# Patient Record
Sex: Female | Born: 1965
Health system: Southern US, Community
[De-identification: ages and names within clinical notes are randomized; demographics above are authoritative.]

## PROBLEM LIST (undated history)

## (undated) DIAGNOSIS — D509 Iron deficiency anemia, unspecified: Secondary | ICD-10-CM

## (undated) DIAGNOSIS — G43909 Migraine, unspecified, not intractable, without status migrainosus: Secondary | ICD-10-CM

## (undated) DIAGNOSIS — I5032 Chronic diastolic (congestive) heart failure: Secondary | ICD-10-CM

## (undated) DIAGNOSIS — I10 Essential (primary) hypertension: Secondary | ICD-10-CM

## (undated) DIAGNOSIS — M545 Low back pain, unspecified: Secondary | ICD-10-CM

## (undated) DIAGNOSIS — R0789 Other chest pain: Secondary | ICD-10-CM

## (undated) DIAGNOSIS — R9389 Abnormal findings on diagnostic imaging of other specified body structures: Secondary | ICD-10-CM

## (undated) DIAGNOSIS — Q245 Malformation of coronary vessels: Secondary | ICD-10-CM

## (undated) HISTORY — PX: BREAST CYST ASPIRATION: SHX578

## (undated) HISTORY — DX: Malformation of coronary vessels: Q24.5

## (undated) HISTORY — PX: TONSILLECTOMY: SUR1361

## (undated) HISTORY — DX: Chronic diastolic (congestive) heart failure: I50.32

---

## 1993-08-04 HISTORY — PX: TUBAL LIGATION: SHX77

## 1993-08-04 HISTORY — PX: WISDOM TOOTH EXTRACTION: SHX21

## 1998-12-08 ENCOUNTER — Emergency Department (HOSPITAL_COMMUNITY): Admission: EM | Admit: 1998-12-08 | Discharge: 1998-12-08 | Payer: Self-pay | Admitting: Emergency Medicine

## 1998-12-11 ENCOUNTER — Encounter: Admission: RE | Admit: 1998-12-11 | Discharge: 1999-03-11 | Payer: Self-pay | Admitting: *Deleted

## 1999-07-13 ENCOUNTER — Emergency Department (HOSPITAL_COMMUNITY): Admission: EM | Admit: 1999-07-13 | Discharge: 1999-07-13 | Payer: Self-pay | Admitting: Emergency Medicine

## 1999-09-13 ENCOUNTER — Other Ambulatory Visit: Admission: RE | Admit: 1999-09-13 | Discharge: 1999-09-13 | Payer: Self-pay | Admitting: Obstetrics & Gynecology

## 2000-10-29 ENCOUNTER — Other Ambulatory Visit: Admission: RE | Admit: 2000-10-29 | Discharge: 2000-10-29 | Payer: Self-pay | Admitting: Obstetrics and Gynecology

## 2001-02-04 ENCOUNTER — Emergency Department (HOSPITAL_COMMUNITY): Admission: EM | Admit: 2001-02-04 | Discharge: 2001-02-04 | Payer: Self-pay | Admitting: Emergency Medicine

## 2001-07-02 ENCOUNTER — Emergency Department (HOSPITAL_COMMUNITY): Admission: EM | Admit: 2001-07-02 | Discharge: 2001-07-02 | Payer: Self-pay | Admitting: *Deleted

## 2001-07-03 ENCOUNTER — Encounter: Payer: Self-pay | Admitting: Emergency Medicine

## 2001-07-03 ENCOUNTER — Emergency Department (HOSPITAL_COMMUNITY): Admission: EM | Admit: 2001-07-03 | Discharge: 2001-07-03 | Payer: Self-pay | Admitting: Emergency Medicine

## 2001-09-09 ENCOUNTER — Encounter: Payer: Self-pay | Admitting: Occupational Medicine

## 2001-09-09 ENCOUNTER — Encounter: Admission: RE | Admit: 2001-09-09 | Discharge: 2001-09-09 | Payer: Self-pay | Admitting: Occupational Medicine

## 2001-09-24 ENCOUNTER — Encounter: Admission: RE | Admit: 2001-09-24 | Discharge: 2001-12-14 | Payer: Self-pay | Admitting: Occupational Medicine

## 2001-10-06 ENCOUNTER — Encounter: Payer: Self-pay | Admitting: Occupational Medicine

## 2001-10-06 ENCOUNTER — Encounter: Admission: RE | Admit: 2001-10-06 | Discharge: 2001-10-06 | Payer: Self-pay | Admitting: Occupational Medicine

## 2001-10-19 ENCOUNTER — Encounter: Admission: RE | Admit: 2001-10-19 | Discharge: 2002-01-17 | Payer: Self-pay

## 2002-01-03 ENCOUNTER — Other Ambulatory Visit: Admission: RE | Admit: 2002-01-03 | Discharge: 2002-01-03 | Payer: Self-pay | Admitting: Obstetrics and Gynecology

## 2002-01-15 ENCOUNTER — Emergency Department (HOSPITAL_COMMUNITY): Admission: EM | Admit: 2002-01-15 | Discharge: 2002-01-15 | Payer: Self-pay | Admitting: Emergency Medicine

## 2002-01-15 ENCOUNTER — Encounter: Payer: Self-pay | Admitting: Emergency Medicine

## 2002-01-18 ENCOUNTER — Encounter: Admission: RE | Admit: 2002-01-18 | Discharge: 2002-04-18 | Payer: Self-pay

## 2002-01-20 ENCOUNTER — Encounter: Admission: RE | Admit: 2002-01-20 | Discharge: 2002-02-02 | Payer: Self-pay

## 2002-06-17 ENCOUNTER — Encounter: Admission: RE | Admit: 2002-06-17 | Discharge: 2002-09-15 | Payer: Self-pay

## 2002-11-03 ENCOUNTER — Encounter: Admission: RE | Admit: 2002-11-03 | Discharge: 2003-02-01 | Payer: Self-pay

## 2003-01-09 ENCOUNTER — Other Ambulatory Visit: Admission: RE | Admit: 2003-01-09 | Discharge: 2003-01-09 | Payer: Self-pay | Admitting: Obstetrics and Gynecology

## 2003-02-23 ENCOUNTER — Ambulatory Visit (HOSPITAL_COMMUNITY): Admission: RE | Admit: 2003-02-23 | Discharge: 2003-02-23 | Payer: Self-pay | Admitting: Obstetrics and Gynecology

## 2003-04-13 ENCOUNTER — Encounter
Admission: RE | Admit: 2003-04-13 | Discharge: 2003-07-12 | Payer: Self-pay | Admitting: Physical Medicine & Rehabilitation

## 2003-07-02 ENCOUNTER — Ambulatory Visit (HOSPITAL_COMMUNITY)
Admission: RE | Admit: 2003-07-02 | Discharge: 2003-07-02 | Payer: Self-pay | Admitting: Physical Medicine & Rehabilitation

## 2003-07-18 ENCOUNTER — Encounter
Admission: RE | Admit: 2003-07-18 | Discharge: 2003-10-16 | Payer: Self-pay | Admitting: Physical Medicine & Rehabilitation

## 2003-10-23 ENCOUNTER — Encounter
Admission: RE | Admit: 2003-10-23 | Discharge: 2004-01-21 | Payer: Self-pay | Admitting: Physical Medicine & Rehabilitation

## 2003-11-28 ENCOUNTER — Encounter
Admission: RE | Admit: 2003-11-28 | Discharge: 2003-12-28 | Payer: Self-pay | Admitting: Physical Medicine & Rehabilitation

## 2004-02-23 ENCOUNTER — Encounter
Admission: RE | Admit: 2004-02-23 | Discharge: 2004-05-23 | Payer: Self-pay | Admitting: Physical Medicine & Rehabilitation

## 2004-05-23 ENCOUNTER — Encounter
Admission: RE | Admit: 2004-05-23 | Discharge: 2004-08-21 | Payer: Self-pay | Admitting: Physical Medicine & Rehabilitation

## 2004-05-27 ENCOUNTER — Ambulatory Visit: Payer: Self-pay | Admitting: Physical Medicine & Rehabilitation

## 2004-06-24 ENCOUNTER — Emergency Department (HOSPITAL_COMMUNITY): Admission: EM | Admit: 2004-06-24 | Discharge: 2004-06-24 | Payer: Self-pay | Admitting: Emergency Medicine

## 2004-08-23 ENCOUNTER — Encounter
Admission: RE | Admit: 2004-08-23 | Discharge: 2004-11-21 | Payer: Self-pay | Admitting: Physical Medicine & Rehabilitation

## 2004-08-27 ENCOUNTER — Ambulatory Visit: Payer: Self-pay | Admitting: Physical Medicine & Rehabilitation

## 2004-11-12 ENCOUNTER — Ambulatory Visit: Payer: Self-pay | Admitting: Physical Medicine & Rehabilitation

## 2005-02-14 ENCOUNTER — Encounter
Admission: RE | Admit: 2005-02-14 | Discharge: 2005-05-15 | Payer: Self-pay | Admitting: Physical Medicine & Rehabilitation

## 2005-02-14 ENCOUNTER — Ambulatory Visit: Payer: Self-pay | Admitting: Physical Medicine & Rehabilitation

## 2005-02-21 ENCOUNTER — Ambulatory Visit (HOSPITAL_COMMUNITY)
Admission: RE | Admit: 2005-02-21 | Discharge: 2005-02-21 | Payer: Self-pay | Admitting: Physical Medicine & Rehabilitation

## 2005-03-24 ENCOUNTER — Ambulatory Visit: Payer: Self-pay | Admitting: Physical Medicine & Rehabilitation

## 2005-05-05 ENCOUNTER — Ambulatory Visit: Payer: Self-pay | Admitting: Physical Medicine & Rehabilitation

## 2005-06-03 ENCOUNTER — Encounter
Admission: RE | Admit: 2005-06-03 | Discharge: 2005-09-01 | Payer: Self-pay | Admitting: Physical Medicine & Rehabilitation

## 2005-08-01 ENCOUNTER — Ambulatory Visit: Payer: Self-pay | Admitting: Physical Medicine & Rehabilitation

## 2005-08-29 ENCOUNTER — Encounter
Admission: RE | Admit: 2005-08-29 | Discharge: 2005-11-27 | Payer: Self-pay | Admitting: Physical Medicine & Rehabilitation

## 2005-09-02 ENCOUNTER — Ambulatory Visit: Payer: Self-pay | Admitting: Physical Medicine & Rehabilitation

## 2005-09-06 ENCOUNTER — Emergency Department (HOSPITAL_COMMUNITY): Admission: EM | Admit: 2005-09-06 | Discharge: 2005-09-06 | Payer: Self-pay | Admitting: Emergency Medicine

## 2005-10-10 ENCOUNTER — Ambulatory Visit: Payer: Self-pay | Admitting: Physical Medicine & Rehabilitation

## 2005-11-08 ENCOUNTER — Emergency Department (HOSPITAL_COMMUNITY): Admission: EM | Admit: 2005-11-08 | Discharge: 2005-11-08 | Payer: Self-pay | Admitting: Family Medicine

## 2005-11-11 ENCOUNTER — Ambulatory Visit: Payer: Self-pay | Admitting: Physical Medicine & Rehabilitation

## 2005-12-04 ENCOUNTER — Encounter
Admission: RE | Admit: 2005-12-04 | Discharge: 2006-03-04 | Payer: Self-pay | Admitting: Physical Medicine & Rehabilitation

## 2006-01-12 ENCOUNTER — Ambulatory Visit: Payer: Self-pay | Admitting: Physical Medicine & Rehabilitation

## 2006-03-10 ENCOUNTER — Ambulatory Visit: Payer: Self-pay | Admitting: Physical Medicine & Rehabilitation

## 2006-03-11 ENCOUNTER — Encounter
Admission: RE | Admit: 2006-03-11 | Discharge: 2006-06-09 | Payer: Self-pay | Admitting: Physical Medicine & Rehabilitation

## 2007-09-14 ENCOUNTER — Inpatient Hospital Stay (HOSPITAL_COMMUNITY): Admission: EM | Admit: 2007-09-14 | Discharge: 2007-09-16 | Payer: Self-pay | Admitting: Emergency Medicine

## 2008-10-30 ENCOUNTER — Emergency Department (HOSPITAL_COMMUNITY): Admission: EM | Admit: 2008-10-30 | Discharge: 2008-10-30 | Payer: Self-pay | Admitting: Emergency Medicine

## 2008-12-30 ENCOUNTER — Emergency Department (HOSPITAL_COMMUNITY): Admission: EM | Admit: 2008-12-30 | Discharge: 2008-12-31 | Payer: Self-pay | Admitting: Emergency Medicine

## 2009-03-07 ENCOUNTER — Emergency Department (HOSPITAL_COMMUNITY): Admission: EM | Admit: 2009-03-07 | Discharge: 2009-03-07 | Payer: Self-pay | Admitting: Emergency Medicine

## 2009-11-24 ENCOUNTER — Emergency Department (HOSPITAL_COMMUNITY): Admission: EM | Admit: 2009-11-24 | Discharge: 2009-11-24 | Payer: Self-pay | Admitting: Emergency Medicine

## 2010-03-08 ENCOUNTER — Emergency Department (HOSPITAL_COMMUNITY): Admission: EM | Admit: 2010-03-08 | Discharge: 2010-03-08 | Payer: Self-pay | Admitting: Emergency Medicine

## 2010-08-05 ENCOUNTER — Emergency Department (HOSPITAL_COMMUNITY)
Admission: EM | Admit: 2010-08-05 | Discharge: 2010-08-05 | Payer: Self-pay | Source: Home / Self Care | Admitting: Emergency Medicine

## 2010-10-14 LAB — DIFFERENTIAL
Basophils Absolute: 0 10*3/uL (ref 0.0–0.1)
Basophils Relative: 1 % (ref 0–1)
Lymphocytes Relative: 46 % (ref 12–46)
Monocytes Absolute: 0.4 10*3/uL (ref 0.1–1.0)
Neutro Abs: 1.7 10*3/uL (ref 1.7–7.7)
Neutrophils Relative %: 40 % — ABNORMAL LOW (ref 43–77)

## 2010-10-14 LAB — COMPREHENSIVE METABOLIC PANEL
Albumin: 3.5 g/dL (ref 3.5–5.2)
BUN: 15 mg/dL (ref 6–23)
Chloride: 107 mEq/L (ref 96–112)
Creatinine, Ser: 1.08 mg/dL (ref 0.4–1.2)
Glucose, Bld: 86 mg/dL (ref 70–99)
Total Bilirubin: 0.4 mg/dL (ref 0.3–1.2)
Total Protein: 7.4 g/dL (ref 6.0–8.3)

## 2010-10-14 LAB — RAPID URINE DRUG SCREEN, HOSP PERFORMED
Amphetamines: NOT DETECTED
Barbiturates: NOT DETECTED
Benzodiazepines: NOT DETECTED
Opiates: NOT DETECTED

## 2010-10-14 LAB — CBC
HCT: 35.4 % — ABNORMAL LOW (ref 36.0–46.0)
MCH: 26.9 pg (ref 26.0–34.0)
MCV: 82.1 fL (ref 78.0–100.0)
Platelets: 278 10*3/uL (ref 150–400)
RDW: 13.4 % (ref 11.5–15.5)

## 2010-10-14 LAB — URINALYSIS, ROUTINE W REFLEX MICROSCOPIC
Bilirubin Urine: NEGATIVE
Ketones, ur: NEGATIVE mg/dL
Protein, ur: NEGATIVE mg/dL
Urobilinogen, UA: 0.2 mg/dL (ref 0.0–1.0)

## 2010-11-09 LAB — URINALYSIS, ROUTINE W REFLEX MICROSCOPIC
Bilirubin Urine: NEGATIVE
Nitrite: NEGATIVE
Specific Gravity, Urine: 1.023 (ref 1.005–1.030)
Urobilinogen, UA: 1 mg/dL (ref 0.0–1.0)
pH: 7.5 (ref 5.0–8.0)

## 2010-11-09 LAB — D-DIMER, QUANTITATIVE: D-Dimer, Quant: 0.34 ug/mL-FEU (ref 0.00–0.48)

## 2010-11-09 LAB — URINE MICROSCOPIC-ADD ON

## 2010-11-09 LAB — POCT CARDIAC MARKERS
CKMB, poc: <1 ng/mL — ABNORMAL LOW (ref 1.0–8.0)
Myoglobin, poc: 55.1 ng/mL (ref 12–200)
Troponin i, poc: <0.05 ng/mL (ref 0.00–0.09)

## 2010-11-09 LAB — POCT I-STAT, CHEM 8
BUN: 12 mg/dL (ref 6–23)
Calcium, Ion: 1.16 mmol/L (ref 1.12–1.32)
Creatinine, Ser: 1.2 mg/dL (ref 0.4–1.2)
Glucose, Bld: 80 mg/dL (ref 70–99)
Hemoglobin: 12.6 g/dL (ref 12.0–15.0)
Sodium: 139 mEq/L (ref 135–145)
TCO2: 20 mmol/L (ref 0–100)

## 2010-11-09 LAB — POCT PREGNANCY, URINE: Preg Test, Ur: NEGATIVE

## 2010-11-14 LAB — CBC
Hemoglobin: 10.9 g/dL — ABNORMAL LOW (ref 12.0–15.0)
MCV: 83.9 fL (ref 78.0–100.0)
RBC: 3.87 MIL/uL (ref 3.87–5.11)
WBC: 8.1 10*3/uL (ref 4.0–10.5)

## 2010-11-14 LAB — BASIC METABOLIC PANEL
CO2: 25 mEq/L (ref 19–32)
Calcium: 9.1 mg/dL (ref 8.4–10.5)
Chloride: 105 mEq/L (ref 96–112)
Creatinine, Ser: 1.01 mg/dL (ref 0.4–1.2)
GFR calc Af Amer: >60 mL/min (ref >60)
Sodium: 137 mEq/L (ref 135–145)

## 2010-11-14 LAB — WOUND CULTURE

## 2010-11-14 LAB — DIFFERENTIAL
Basophils Absolute: 0 10*3/uL (ref 0.0–0.1)
Eosinophils Absolute: 0.2 10*3/uL (ref 0.0–0.7)
Eosinophils Relative: 2 % (ref 0–5)
Lymphs Abs: 2.1 10*3/uL (ref 0.7–4.0)

## 2010-11-29 ENCOUNTER — Inpatient Hospital Stay (INDEPENDENT_AMBULATORY_CARE_PROVIDER_SITE_OTHER)
Admission: RE | Admit: 2010-11-29 | Discharge: 2010-11-29 | Disposition: A | Discharge status: Home / Self Care | Payer: Self-pay | Source: Ambulatory Visit | Attending: Family Medicine | Admitting: Family Medicine

## 2010-11-29 DIAGNOSIS — B9789 Other viral agents as the cause of diseases classified elsewhere: Secondary | ICD-10-CM

## 2010-11-29 LAB — POCT URINALYSIS DIP (DEVICE)
Glucose, UA: NEGATIVE mg/dL
Nitrite: NEGATIVE
Urobilinogen, UA: 0.2 mg/dL (ref 0.0–1.0)

## 2010-12-17 NOTE — Consult Note (Signed)
Monique, Aguilar NO.:  [id number]   MEDICAL RECORD NO.:  [id number]          PATIENT TYPE:  INP   LOCATION:  1442                         FACILITY:  [facility]   PHYSICIAN:  Antonietta Breach, M.D.  DATE OF BIRTH:  07/18/1968   DATE OF CONSULTATION:  09/16/2007  DATE OF DISCHARGE:  09/16/2007                                 CONSULTATION   REASON FOR CONSULTATION:  Overdose.   REQUESTING PHYSICIAN:  [facility].   HISTORY OF PRESENT ILLNESS:  Monique Aguilar is a 45 year old female  admitted to the [facility] on February 9 for complications of  a drug overdose.  The patient denies that she ever had any intentions of  harming herself.  She denies any thoughts of harming herself.  She  denies thoughts of harming others.  She does not have hallucinations or  delusions.  Her orientation function and memory function are intact.  She is cooperative with bedside care.  She states that the overdose was  an accident.  She thinks that it was misconstrued as a suicide attempt.  She has constructive future goals and interests.   PAST PSYCHIATRIC HISTORY:  The patient does have a history of treatment  with Effexor and Wellbutrin.  However, she denies any problems with  mood.  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   FAMILY PSYCHIATRIC HISTORY:  None known.   SOCIAL HISTORY:  CNA, married.  There have been some arguments at home.  The patient denies any alcohol or drug problems.   PAST MEDICAL HISTORY:  Drug overdose.   MEDICATIONS:  The patient is not on any psychotropic medications.   REVIEW OF SYSTEMS:  Unremarkable.   MENTAL STATUS EXAM:  [name] is alert.  She is oriented to all  spheres.  Her memory function is intact.  Her speech is normal.  Thought  process logical, coherent, goal-directed.  No looseness of associations.  Thought content no thoughts of harming herself, no thoughts of harming  others.  No delusions, no  hallucinations.  Insight is intact.  Affect is  broad and appropriate.  Judgment is intact.   ASSESSMENT:  AXIS I:  Adjustment disorder with mixed disturbance of  emotions and conduct, now resolved, 293.00, delirium not otherwise  specified.  AXIS II:  Deferred.  AXIS III:  See general medical section.  AXIS IV:  Marital.  AXIS V:  55.   [name] is not at risk to harm herself or others.  She agrees to call  emergency services immediately for any thoughts of harming herself,  thoughts of harming others or distress.   The patient declines any psychiatric care and she is no longer  committable after recovering from her acute mental status changes.  However, given the inconsistency in her history versus reports, further  evaluation on an inpatient psychiatric unit is recommended but not  mandatory.  As mentioned above, the patient does agree to call emergency  services immediately if any emergency psychiatric symptoms present.  Therefore the patient is psychiatrically cleared for  discharge since she  does not present any behavioral, emotional or thought problems that  mandate treatment.  If there are any other concerns, please call [facility] at [phone].      Antonietta Breach, M.D.  Electronically Signed     JW/MEDQ  D:  09/16/2007  T:  09/18/2007  Job:  [id number]

## 2010-12-17 NOTE — Discharge Summary (Signed)
Monique Aguilar, RIKARD NO.:  [id number]   MEDICAL RECORD NO.:  [id number]          PATIENT TYPE:  INP   LOCATION:  1442                         FACILITY:  [facility]   PHYSICIAN:  Marcellus Scott, MD     DATE OF BIRTH:  07/18/1968   DATE OF ADMISSION:  09/13/2007  DATE OF DISCHARGE:  09/16/2007                               DISCHARGE SUMMARY   PRIMARY CARE PHYSICIAN:  Unassigned.   PAIN MANAGEMENT PHYSICIAN:  Dr. Ranelle Oyster.   DISCHARGE DIAGNOSES:  1. Change in mental status, resolved.  2. Drug overdose - poly-pharmacy.  3. Depression.  4. Anemia.  5. Renal insufficiency.  6. Chronic back pain.   DISCHARGE MEDICATIONS:  All of her medications have to be very closely  reviewed by her pain management team and her psychiatrist, and to be  adjusted.  1. Neurontin 600 mg p.o. three times daily.  2. Ultracet 325/37.5 mg, one to two tab p.o. four times daily p.r.n.  3. Wellbutrin XL 300 mg p.o. daily.  4. Lyrica 150 mg p.o. twice daily.  5. Topamax 100 mg p.o. at bedtime.  6. Baclofen 5 mg p.o. p.r.n.  7. Flexeril 10 mg p.o. p.r.n.  8. Prednisone 10 mg p.o. daily.   PROCEDURE:  Portable chest x-ray on September 13, 2007:  Impression is  poor inspiratory portable exam with mild pulmonary vascular congestion.   LABORATORY DATA:  Basic metabolic panel remarkable for a BUN of 12,  creatinine 1.29.  Hepatic panel was unremarkable except for an albumin  of 3.  CBC:  Hemoglobin 10.8, hematocrit 31.6, white blood cells 4.7,  platelets 277.  INR 1.1.  Serum salicylate less than 4.  Blood alcohol  level negative.  Serum acetaminophen less than 10.  Urine pregnancy test  negative.  Urine drug screen also negative.  Urinalysis negative for  features suggestive of urinary tract infection.   CONSULTATIONS:  Psychiatry, Dr. Antonietta Breach.   HISTORY OF PRESENT ILLNESS:  Please refer to the history and physical  note for initial admission details.  Ms. Monique Aguilar is a  pleasant 45-  year-old African/American female patient with a history of chronic back  pain and depression, who after an argument with her spouse, consumed a  lot of medications.  Not sure of exactly what and how many medications  she consumed.  The patient indicated that she put a lot of different  medications in a Percocet bottle and she is unable to say which ones  they are and how many she took on the day of admission.  Many of these  medications are even expired.  In any event, her 46 year old daughter  called 911, and EMS brought her to the emergency room.  The patient en  route here was combative and had to be restrained.   HOSPITAL COURSE:  In the emergency department the patient was  significantly drowsy and not arousable.  She was assessed to have an  unknown drug overdose and was admitted to the intensive care unit for  close monitoring.  The patient was admitted to the intensive  care unit.  She was placed on a one on one sitter.  She was kept n.p.o. and hydrated  with IV fluids.  Her neurological status was closely monitored.  By the  afternoon of September 14, 2007, the patient woke up and was quite alert  and awake, following which she transitioned to a medical floor.  She was  resumed on her diet, which she has tolerated.  Since then the patient  has been without any complaints and stable.  The patient has been  indicating that she would like to go home.  She vehemently denied having  attempted suicide.  She also was not keen on getting an inpatient  psychiatric visit.  She again was unable to clarify the exact  medications that she is on.  Psychiatry has evaluated her today and  indicated that she declined additional treatment and they have  psychiatrically cleared her for discharge, and also indicated that she  is not committable.   DISPOSITION:  The patient at this time has been advised to follow up  with her pain management physician and psychiatrist to correctly  advise  her regarding the exact medications that she is supposed to be on.  She  has been counseled against using poly-pharmacy and expired medications,  and the detrimental effects of the same, which she verbalized  understanding.  The patient at this time is stable to be discharged, and  followed up as an outpatient.      Marcellus Scott, MD  Electronically Signed     AH/MEDQ  D:  09/16/2007  T:  09/16/2007  Job:  [id number]   cc:   Antonietta Breach, M.D.   Ranelle Oyster, M.D.  Fax: [phone]

## 2010-12-17 NOTE — H&P (Signed)
NAMESHANAE, LUO                 ACCOUNT NO.:  [id number]   MEDICAL RECORD NO.:  [id number]          PATIENT TYPE:  INP   LOCATION:  0101                         FACILITY:  [facility]   PHYSICIAN:  Darryl D. Prime, MD    DATE OF BIRTH:  07/18/1968   DATE OF ADMISSION:  09/13/2007  DATE OF DISCHARGE:                              HISTORY & PHYSICAL   PRIMARY CARE PHYSICIAN:  The patient has no primary care physician.   TIME SEEN:  Total visit time was approximately 50 minutes.   CHIEF COMPLAINT:  The patient cannot give a chief complaint.  She is  Full Code.  Her sister is at the bedside and notes the patient took  medications.   HISTORY OF THE PRESENT ILLNESS:  The patient is a 45 year old female  with a history of chronic back pain and depression who had a possible  overdose.  The patient apparently got in a fight with her husband  earlier this evening and her daughter, a 29 year old, called 9-1-1.  The  patient's son, a 35 year old from another relationship, apparently  visited them tonight and apparently her current husband gets very angry  when this happens.  There was apparently an ensuing argument and  apparently this upset the patient to where she took many medications.  The patient has many expired medications with her that she takes and  that she keeps them nearby when she is depressed and occasionally takes  per the sister.  I am unsure of when she took the medications, what type  of medications she took and how many.  In the bag she has Neurontin 600-  mg tablets, Ultracet 325/37.5-mg tablets, cephalexin 500-mg tablets,  Percocet, amoxicillin 875-mg tablets, Vicodin 7.5/750, Anaprox 550-mg  tablets, Wellbutrin XL 300-mg tablets, Effexor XR 75-mg tablets, Relafen  500-mg tablets, Lyrica 150-mg tablets, Topamax 100-mg tablets, baclofen  5-mg tablets, cyclobenzaprine 10-mg tablets, and prednisone 10-mg  tablets and also Ambien.  En route the patient apparently became  combative with SWAT police officers and EMS personnel and had to be  restrained.  The patient since then has been significantly drowsy and  somnolent so much so that she is now unarousable.  She arouses some with  nauseous stimuli for a second or two; she is very difficult to arouse  otherwise.   PAST MEDICAL AND SURGICAL HISTORY:  1. History as above.  2. Possible bipolar disorder.   MEDICATIONS:  The medications are as above.  She does not take any of  these regularly, but only when depressed.   ALLERGIES:  No known drug allergies.   SOCIAL HISTORY:  The patient has never smoked or drank.  No illicit drug  use.  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  She lives at home with her husband and her  daughter.   FAMILY HISTORY:  The family history is positive for alcohol abuse in her  mother and the mother has a history of seizures.   REVIEW OF SYSTEMS:  The review of systems cannot be obtained secondary  to altered mental status.   PHYSICAL EXAMINATION:  VITAL SIGNS:  Temperature is 97.7 with a blood  pressure of 136/73, pulse is in the range of 85-127, respiratory rate  16, and sat 100%.  GENERAL APPEARANCE:  In general the patient looks her stated age.  She  is sleeping and is sedated.  HEENT:  Pupils are equal, round and react to light, 2 mm.  Conjunctivae  are not pale.  Anicteric sclerae.  The oropharynx is dry.  NECK:  The neck is supple with no lymphadenopathy or thyromegaly.  No  carotid bruits.  LUNGS:  The lungs are clear to auscultation bilaterally.  HEART:  Cardiovascular exam is regular rhythm and rate with no murmurs,  rubs or gallops.  Normal S1 and S2 with no S3 or S4.  ABDOMEN:  The abdomen is soft and she does not withdraw with palpation.  Positive bowel sounds.  EXTREMITIES:  The extremities show no clubbing, cyanosis or edema.  She  is in four-point restraints.  The patient moves all extremities at times  when  attempts are made to arouse her.   LABORATORY DATA:  The patient's laboratory data shows a white count of  5.3 with a hemoglobin of 11.4, hematocrit 33.7, platelets are 290,000  with an  MCV of 82.7, and segs are 66%.  Salicylate is less than 4.  Urine drug screen negative.  Urinalysis is negative.  Acetaminophen less  than 10.  Alcohol less than 5.  Sodium 137, potassium 4.0, chloride 107,  bicarb 21, BUN 10, creatinine 1.24, and glucose 96.  LFTs are otherwise  unremarkable.  Albumin is 3.6.  Chest x-ray is pending.   ASSESSMENT AND PLAN:  This is a patient with history of depression and  chronic back pain -- history of possible significant depression who took  an overdose, unsure of the drug, and we cannot rule out a suicide  attempt.  She will be admitted to the intensive care unit with frequent  neurological checks and vital signs will be followed closely.  When she  does arouse she will most likely need a psychiatric consult.  Deep  venous thrombosis prophylaxis will be with enoxaparin and  gastrointestinal prophylaxis with a proton pump inhibitor.  We will  check an INR, prothrombin time and partial thromboplastin time to assess  her liver function further; and, we will also hemoccult her stools due  to her decreased hemoglobin.      Darryl D. Prime, MD  Electronically Signed     DDP/MEDQ  D:  09/14/2007  T:  09/14/2007  Job:  [id number]

## 2010-12-25 ENCOUNTER — Inpatient Hospital Stay (INDEPENDENT_AMBULATORY_CARE_PROVIDER_SITE_OTHER)
Admission: RE | Admit: 2010-12-25 | Discharge: 2010-12-25 | Disposition: A | Discharge status: Home / Self Care | Payer: Self-pay | Source: Ambulatory Visit | Attending: Emergency Medicine | Admitting: Emergency Medicine

## 2010-12-25 ENCOUNTER — Other Ambulatory Visit: Payer: Self-pay | Admitting: Emergency Medicine

## 2010-12-25 DIAGNOSIS — Z1231 Encounter for screening mammogram for malignant neoplasm of breast: Secondary | ICD-10-CM

## 2010-12-25 DIAGNOSIS — M545 Low back pain: Secondary | ICD-10-CM

## 2010-12-25 DIAGNOSIS — S335XXA Sprain of ligaments of lumbar spine, initial encounter: Secondary | ICD-10-CM

## 2010-12-25 LAB — POCT URINALYSIS DIP (DEVICE)
Glucose, UA: NEGATIVE mg/dL
Nitrite: NEGATIVE
Protein, ur: NEGATIVE mg/dL
Urobilinogen, UA: 0.2 mg/dL (ref 0.0–1.0)
pH: 6.5 (ref 5.0–8.0)

## 2011-01-02 ENCOUNTER — Ambulatory Visit
Admission: RE | Admit: 2011-01-02 | Discharge: 2011-01-02 | Disposition: A | Discharge status: Home / Self Care | Payer: Self-pay | Source: Ambulatory Visit | Attending: Emergency Medicine | Admitting: Emergency Medicine

## 2011-01-02 DIAGNOSIS — Z1231 Encounter for screening mammogram for malignant neoplasm of breast: Secondary | ICD-10-CM

## 2011-01-07 ENCOUNTER — Other Ambulatory Visit: Payer: Self-pay | Admitting: Emergency Medicine

## 2011-01-07 DIAGNOSIS — R928 Other abnormal and inconclusive findings on diagnostic imaging of breast: Secondary | ICD-10-CM

## 2011-01-14 ENCOUNTER — Ambulatory Visit
Admission: RE | Admit: 2011-01-14 | Discharge: 2011-01-14 | Disposition: A | Discharge status: Home / Self Care | Payer: Self-pay | Source: Ambulatory Visit | Attending: Emergency Medicine | Admitting: Emergency Medicine

## 2011-01-14 ENCOUNTER — Other Ambulatory Visit: Payer: Self-pay | Admitting: Emergency Medicine

## 2011-01-14 DIAGNOSIS — R928 Other abnormal and inconclusive findings on diagnostic imaging of breast: Secondary | ICD-10-CM

## 2011-01-22 ENCOUNTER — Ambulatory Visit
Admission: RE | Admit: 2011-01-22 | Discharge: 2011-01-22 | Disposition: A | Discharge status: Home / Self Care | Payer: Self-pay | Source: Ambulatory Visit | Attending: Emergency Medicine | Admitting: Emergency Medicine

## 2011-01-22 ENCOUNTER — Other Ambulatory Visit: Payer: Self-pay

## 2011-01-22 ENCOUNTER — Other Ambulatory Visit: Payer: Self-pay | Admitting: Emergency Medicine

## 2011-01-22 ENCOUNTER — Other Ambulatory Visit: Payer: Self-pay | Admitting: Diagnostic Radiology

## 2011-01-22 DIAGNOSIS — R928 Other abnormal and inconclusive findings on diagnostic imaging of breast: Secondary | ICD-10-CM

## 2011-01-23 ENCOUNTER — Ambulatory Visit
Admission: RE | Admit: 2011-01-23 | Discharge: 2011-01-23 | Disposition: A | Discharge status: Home / Self Care | Payer: Self-pay | Source: Ambulatory Visit | Attending: Emergency Medicine | Admitting: Emergency Medicine

## 2011-01-23 DIAGNOSIS — R928 Other abnormal and inconclusive findings on diagnostic imaging of breast: Secondary | ICD-10-CM

## 2011-04-08 ENCOUNTER — Inpatient Hospital Stay (INDEPENDENT_AMBULATORY_CARE_PROVIDER_SITE_OTHER)
Admission: RE | Admit: 2011-04-08 | Discharge: 2011-04-08 | Disposition: A | Discharge status: Home / Self Care | Payer: Self-pay | Source: Ambulatory Visit | Attending: Family Medicine | Admitting: Family Medicine

## 2011-04-08 DIAGNOSIS — K5289 Other specified noninfective gastroenteritis and colitis: Secondary | ICD-10-CM

## 2011-04-25 LAB — CBC
HCT: 31.6 — ABNORMAL LOW
HCT: 33.7 — ABNORMAL LOW
Hemoglobin: 10.8 — ABNORMAL LOW
MCHC: 33.9
MCHC: 34.1
MCV: 82.7
Platelets: 290
RDW: 13.1
RDW: 13.6

## 2011-04-25 LAB — DIFFERENTIAL
Lymphocytes Relative: 26
Monocytes Absolute: 0.3
Monocytes Relative: 6
Neutro Abs: 3.5
Neutrophils Relative %: 66

## 2011-04-25 LAB — COMPREHENSIVE METABOLIC PANEL
Albumin: 3.6
Alkaline Phosphatase: 45
BUN: 10
BUN: 11
Calcium: 8.8
Calcium: 9.3
Creatinine, Ser: 1.24 — ABNORMAL HIGH
Creatinine, Ser: 1.47 — ABNORMAL HIGH
Glucose, Bld: 84
Total Protein: 6.2
Total Protein: 7.1

## 2011-04-25 LAB — BASIC METABOLIC PANEL
CO2: 20
Calcium: 8.6
Chloride: 111
GFR calc Af Amer: 55 — ABNORMAL LOW
Potassium: 4.1
Sodium: 135

## 2011-04-25 LAB — URINALYSIS, ROUTINE W REFLEX MICROSCOPIC
Glucose, UA: NEGATIVE
Nitrite: NEGATIVE
Protein, ur: NEGATIVE
pH: 8

## 2011-04-25 LAB — PROTIME-INR
INR: 1.1
Prothrombin Time: 14

## 2011-04-25 LAB — RAPID URINE DRUG SCREEN, HOSP PERFORMED
Barbiturates: NOT DETECTED
Benzodiazepines: NOT DETECTED
Cocaine: NOT DETECTED

## 2011-04-25 LAB — ETHANOL: Alcohol, Ethyl (B): <5

## 2011-04-25 LAB — PREGNANCY, URINE: Preg Test, Ur: NEGATIVE

## 2011-09-22 ENCOUNTER — Encounter (HOSPITAL_COMMUNITY): Payer: Self-pay | Admitting: Emergency Medicine

## 2011-09-22 ENCOUNTER — Emergency Department (HOSPITAL_COMMUNITY)
Admission: EM | Admit: 2011-09-22 | Discharge: 2011-09-22 | Disposition: A | Discharge status: Home / Self Care | Payer: Self-pay | Source: Home / Self Care | Attending: Emergency Medicine | Admitting: Emergency Medicine

## 2011-09-22 DIAGNOSIS — M545 Low back pain: Secondary | ICD-10-CM

## 2011-09-22 MED ORDER — CYCLOBENZAPRINE HCL 10 MG PO TABS: 10.0000 mg | ORAL_TABLET | Freq: Three times a day (TID) | ORAL | Status: AC | PRN

## 2011-09-22 MED ORDER — PREDNISONE 20 MG PO TABS: ORAL_TABLET | ORAL | Status: AC

## 2011-09-22 MED ORDER — IBUPROFEN 800 MG PO TABS: 800.0000 mg | ORAL_TABLET | Freq: Three times a day (TID) | ORAL | Status: AC | PRN

## 2011-09-22 NOTE — ED Provider Notes (Signed)
History     CSN: [id number]  Arrival date & time 09/22/11  7829   First MD Initiated Contact with Patient 09/22/11 [phone]      Chief Complaint  Patient presents with  . Back Pain  . Leg Pain    (Consider location/radiation/quality/duration/timing/severity/associated sxs/prior treatment) HPI Comments: Patient with achy bilateral lower back pain radiating to her hips starting 4 days ago. He patient states this is identical to previous back pain flares. Has been taking Goody powders and some leftover Lortab. Has been treated successfully in the past with ibuprofen, muscle relaxants, and steroids.  Patient is a 46 y.o. female presenting with back pain and leg pain. The history is provided by the patient. No language interpreter was used.  Back Pain  This is a recurrent problem. The current episode started more than 2 days ago. The problem occurs constantly. The problem has not changed since onset.The pain is associated with lifting heavy objects. The pain is present in the lumbar spine. The quality of the pain is described as aching. The symptoms are aggravated by bending, certain positions and twisting. Associated symptoms include leg pain. Pertinent negatives include no fever, no numbness, no abdominal pain, no abdominal swelling, no bowel incontinence, no perianal numbness, no bladder incontinence, no dysuria, no pelvic pain, no paresthesias, no paresis, no tingling and no weakness. She has tried heat and analgesics for the symptoms. The treatment provided mild relief.  Leg Pain  Pertinent negatives include no numbness and no tingling.    Past Medical History  Diagnosis Date  . Back pain     Past Surgical History  Procedure Date  . Tonsillectomy   . Cesarean section     History reviewed. No pertinent family history.  History  Substance Use Topics  . Smoking status: Never Smoker   . Smokeless tobacco: Not on file  . Alcohol Use: No    OB History    Grav Para Term Preterm  Abortions TAB SAB Ect Mult Living                  Review of Systems  Constitutional: Negative for fever.  Gastrointestinal: Negative for abdominal pain and bowel incontinence.  Genitourinary: Negative for bladder incontinence, dysuria and pelvic pain.  Musculoskeletal: Positive for back pain.  Neurological: Negative for tingling, weakness, numbness and paresthesias.    Allergies  Review of patient's allergies indicates no known allergies.  Home Medications   Current Outpatient Rx  Name Route Sig Dispense Refill  . HYDROCODONE-ACETAMINOPHEN 10-500 MG PO TABS Oral Take 1 tablet by mouth every 6 (six) hours as needed.    . CYCLOBENZAPRINE HCL 10 MG PO TABS Oral Take 1 tablet (10 mg total) by mouth 3 (three) times daily as needed for muscle spasms. 20 tablet 0  . IBUPROFEN 800 MG PO TABS Oral Take 1 tablet (800 mg total) by mouth every 8 (eight) hours as needed for pain. 30 tablet 0  . PREDNISONE 20 MG PO TABS  Take 3 tabs po on first day, 2 tabs second day, 2 tabs third day, 1 tab fourth day, 1 tab 5th day. Take with food. 9 tablet 0    BP 117/68  Pulse 70  Temp(Src) 98.2 F (36.8 C) (Oral)  Resp 16  SpO2 100%  LMP 09/10/2011  Physical Exam  Nursing note and vitals reviewed. Constitutional: She is oriented to person, place, and time. She appears well-developed and well-nourished. No distress.  HENT:  Head: Normocephalic and atraumatic.  Eyes:  Conjunctivae and EOM are normal.  Neck: Normal range of motion.  Cardiovascular: Normal rate.   Pulmonary/Chest: Effort normal and breath sounds normal.  Abdominal: Soft. Bowel sounds are normal. She exhibits no distension. There is no tenderness. There is no rebound, no guarding and no CVA tenderness.  Musculoskeletal: Normal range of motion.       Lumbar back: She exhibits tenderness and spasm.       Back:       Bilateral lower extremities nontender, baseline ROM with intact PT pulses, No pain with PROM hips bilaterally. SLR neg  bilaterally. Sensation baseline light touch bilaterally for Pt, DTR's symmetric and intact bilaterally KJ, Motor symmetric bilateral 5/5 hip flexion, quadriceps, hamstrings, EHL, foot dorsiflexion, foot plantarflexion, gait somewhat antalgic but without apparent new ataxia.  Neurological: She is alert and oriented to person, place, and time.  Skin: Skin is warm and dry.  Psychiatric: She has a normal mood and affect. Her behavior is normal. Judgment and thought content normal.    ED Course  Procedures (including critical care time)  Labs Reviewed - No data to display No results found.   1. Low back pain      MDM  No evidence of uti, nephrolithiasis. No evidence of spinal cord involvement based on H&P. Pt describing typical back pain, has been <6 week duration. No red flags such as fevers, age >40, h/o trauma with bony tenderness, neurological deficits, bladder/ bowel incontinence, h/o CA, unexplained weight loss, pain worse at night,  h/o prolonged steroid use, h/o osteopenia, h/o IVDU. Imaging not indicated at this time. Will send home with NSAIDs, muscle relaxants, short course of steroids.   Luiz Blare, MD 09/22/11 [phone]

## 2011-09-22 NOTE — ED Notes (Signed)
PT HEREWITH LOWER BACK PAIN RADIATING DOWN TO HIPS AND LEGS THAT STARTED LAST Thursday.WORSENS WITH SLEEPING OR AMBULATING.PT STATES SHE HAD BACK INJURY X 6 YRS AND OCCASIONALLY HAS FLARE UP.STATES STEROID INJECTION AND IBUPROFEN WORKS WELL.C/P SHARP PAIN IN BACK AND DEEP ACHY DULL PAIN RADIATING TO HIPS/LEGS

## 2011-09-22 NOTE — Discharge Instructions (Signed)
Take the medication as written. Take 1 gram of tylenol with the motrin up to 3 times a day as needed for pain and fever. This is an effective combination for pain. Take the lortab only for severe pain. Do not take the tylenol and lortab as they both have tylenol in them and too much can hurt your liver. Return if you get worse, have  fever >100.4, or for any concerns.   Back Pain, Adult Low back pain is very common. About 1 in 5 people have back pain.The cause of low back pain is rarely dangerous. The pain often gets better over time.About half of people with a sudden onset of back pain feel better in just 2 weeks. About 8 in 10 people feel better by 6 weeks.  CAUSES Some common causes of back pain include:  Strain of the muscles or ligaments supporting the spine.   Wear and tear (degeneration) of the spinal discs.   Arthritis.   Direct injury to the back.  DIAGNOSIS Most of the time, the direct cause of low back pain is not known.However, back pain can be treated effectively even when the exact cause of the pain is unknown.Answering your caregiver's questions about your overall health and symptoms is one of the most accurate ways to make sure the cause of your pain is not dangerous. If your caregiver needs more information, he or she may order lab work or imaging tests (X-rays or MRIs).However, even if imaging tests show changes in your back, this usually does not require surgery. HOME CARE INSTRUCTIONS For many people, back pain returns.Since low back pain is rarely dangerous, it is often a condition that people can learn to [facility] their own.   Remain active. It is stressful on the back to sit or stand in one place. Do not sit, drive, or stand in one place for more than 30 minutes at a time. Take short walks on level surfaces as soon as pain allows.Try to increase the length of time you walk each day.   Do not stay in bed.Resting more than 1 or 2 days can delay your recovery.   Do  not avoid exercise or work.Your body is made to move.It is not dangerous to be active, even though your back may hurt.Your back will likely heal faster if you return to being active before your pain is gone.   Pay attention to your body when you bend and lift. Many people have less discomfortwhen lifting if they bend their knees, keep the load close to their bodies,and avoid twisting. Often, the most comfortable positions are those that put less stress on your recovering back.   Find a comfortable position to sleep. Use a firm mattress and lie on your side with your knees slightly bent. If you lie on your back, put a pillow under your knees.   Only take over-the-counter or prescription medicines as directed by your caregiver. Over-the-counter medicines to reduce pain and inflammation are often the most helpful.Your caregiver may prescribe muscle relaxant drugs.These medicines help dull your pain so you can more quickly return to your normal activities and healthy exercise.   Put ice on the injured area.   Put ice in a plastic bag.   Place a towel between your skin and the bag.   Leave the ice on for 15 to 20 minutes, 3 to 4 times a day for the first 2 to 3 days. After that, ice and heat may be alternated to reduce pain and  spasms.   Ask your caregiver about trying back exercises and gentle massage. This may be of some benefit.   Avoid feeling anxious or stressed.Stress increases muscle tension and can worsen back pain.It is important to recognize when you are anxious or stressed and learn ways to manage it.Exercise is a great option.  SEEK MEDICAL CARE IF:  You have pain that is not relieved with rest or medicine.   You have pain that does not improve in 1 week.   You have new symptoms.   You are generally not feeling well.  SEEK IMMEDIATE MEDICAL CARE IF:   You have pain that radiates from your back into your legs.   You develop new bowel or bladder control problems.    You have unusual weakness or numbness in your arms or legs.   You develop nausea or vomiting.   You develop abdominal pain.   You feel faint.  Document Released: 07/21/2005 Document Revised: 04/02/2011 Document Reviewed: 12/09/2010 [facility] Patient Information 2012 [place], [place]. Back Pain, Adult Low back pain is very common. About 1 in 5 people have back pain.The cause of low back pain is rarely dangerous. The pain often gets better over time.About half of people with a sudden onset of back pain feel better in just 2 weeks. About 8 in 10 people feel better by 6 weeks.  CAUSES Some common causes of back pain include:  Strain of the muscles or ligaments supporting the spine.   Wear and tear (degeneration) of the spinal discs.   Arthritis.   Direct injury to the back.  DIAGNOSIS Most of the time, the direct cause of low back pain is not known.However, back pain can be treated effectively even when the exact cause of the pain is unknown.Answering your caregiver's questions about your overall health and symptoms is one of the most accurate ways to make sure the cause of your pain is not dangerous. If your caregiver needs more information, he or she may order lab work or imaging tests (X-rays or MRIs).However, even if imaging tests show changes in your back, this usually does not require surgery. HOME CARE INSTRUCTIONS For many people, back pain returns.Since low back pain is rarely dangerous, it is often a condition that people can learn to [facility] their own.   Remain active. It is stressful on the back to sit or stand in one place. Do not sit, drive, or stand in one place for more than 30 minutes at a time. Take short walks on level surfaces as soon as pain allows.Try to increase the length of time you walk each day.   Do not stay in bed.Resting more than 1 or 2 days can delay your recovery.   Do not avoid exercise or work.Your body is made to move.It is not dangerous to  be active, even though your back may hurt.Your back will likely heal faster if you return to being active before your pain is gone.   Pay attention to your body when you bend and lift. Many people have less discomfortwhen lifting if they bend their knees, keep the load close to their bodies,and avoid twisting. Often, the most comfortable positions are those that put less stress on your recovering back.   Find a comfortable position to sleep. Use a firm mattress and lie on your side with your knees slightly bent. If you lie on your back, put a pillow under your knees.   Only take over-the-counter or prescription medicines as directed by your caregiver.  Over-the-counter medicines to reduce pain and inflammation are often the most helpful.Your caregiver may prescribe muscle relaxant drugs.These medicines help dull your pain so you can more quickly return to your normal activities and healthy exercise.   Put ice on the injured area.   Put ice in a plastic bag.   Place a towel between your skin and the bag.   Leave the ice on for 15 to 20 minutes, 3 to 4 times a day for the first 2 to 3 days. After that, ice and heat may be alternated to reduce pain and spasms.   Ask your caregiver about trying back exercises and gentle massage. This may be of some benefit.   Avoid feeling anxious or stressed.Stress increases muscle tension and can worsen back pain.It is important to recognize when you are anxious or stressed and learn ways to manage it.Exercise is a great option.  SEEK MEDICAL CARE IF:  You have pain that is not relieved with rest or medicine.   You have pain that does not improve in 1 week.   You have new symptoms.   You are generally not feeling well.  SEEK IMMEDIATE MEDICAL CARE IF:   You have pain that radiates from your back into your legs.   You develop new bowel or bladder control problems.   You have unusual weakness or numbness in your arms or legs.   You develop  nausea or vomiting.   You develop abdominal pain.   You feel faint.  Document Released: 07/21/2005 Document Revised: 04/02/2011 Document Reviewed: 12/09/2010 [facility] Patient Information 2012 [place], [place].

## 2011-11-21 ENCOUNTER — Encounter (HOSPITAL_COMMUNITY): Payer: Self-pay | Admitting: Emergency Medicine

## 2011-11-21 ENCOUNTER — Emergency Department (HOSPITAL_COMMUNITY)
Admission: EM | Admit: 2011-11-21 | Discharge: 2011-11-21 | Disposition: A | Discharge status: Home / Self Care | Payer: Self-pay | Attending: Emergency Medicine | Admitting: Emergency Medicine

## 2011-11-21 DIAGNOSIS — Y9289 Other specified places as the place of occurrence of the external cause: Secondary | ICD-10-CM | POA: Insufficient documentation

## 2011-11-21 DIAGNOSIS — T63301A Toxic effect of unspecified spider venom, accidental (unintentional), initial encounter: Secondary | ICD-10-CM

## 2011-11-21 DIAGNOSIS — T63391A Toxic effect of venom of other spider, accidental (unintentional), initial encounter: Secondary | ICD-10-CM | POA: Insufficient documentation

## 2011-11-21 DIAGNOSIS — T6391XA Toxic effect of contact with unspecified venomous animal, accidental (unintentional), initial encounter: Secondary | ICD-10-CM | POA: Insufficient documentation

## 2011-11-21 MED ORDER — ONDANSETRON HCL 4 MG PO TABS: 4.0000 mg | ORAL_TABLET | Freq: Four times a day (QID) | ORAL | Status: AC

## 2011-11-21 MED ORDER — IBUPROFEN 800 MG PO TABS
800.0000 mg | ORAL_TABLET | Freq: Once | ORAL | Status: AC
  Administered 2011-11-21: 800 mg via ORAL
  Filled 2011-11-21: qty 1

## 2011-11-21 MED ORDER — OXYCODONE-ACETAMINOPHEN 5-325 MG PO TABS: 2.0000 | ORAL_TABLET | ORAL | Status: AC | PRN

## 2011-11-21 MED ORDER — CEPHALEXIN 500 MG PO CAPS: 500.0000 mg | ORAL_CAPSULE | Freq: Four times a day (QID) | ORAL | Status: AC

## 2011-11-21 MED ORDER — DIPHENHYDRAMINE HCL 25 MG PO CAPS: 25.0000 mg | ORAL_CAPSULE | Freq: Four times a day (QID) | ORAL | Status: DC | PRN

## 2011-11-21 MED ORDER — TETANUS-DIPHTH-ACELL PERTUSSIS 5-2.5-18.5 LF-MCG/0.5 IM SUSP
0.5000 mL | Freq: Once | INTRAMUSCULAR | Status: AC
  Administered 2011-11-21: 0.5 mL via INTRAMUSCULAR
  Filled 2011-11-21: qty 0.5

## 2011-11-21 MED ORDER — OXYCODONE-ACETAMINOPHEN 5-325 MG PO TABS: 2.0000 | ORAL_TABLET | ORAL | Status: DC | PRN

## 2011-11-21 NOTE — ED Notes (Signed)
Pt states she was bit on nose around 1540 by a black spider.  States area opened and starting having white drainage about 30 min ago.

## 2011-11-21 NOTE — ED Provider Notes (Signed)
History     CSN: [id number]  Arrival date & time 11/21/11  1800   First MD Initiated Contact with Patient 11/21/11 1832      Chief Complaint  Patient presents with  . Insect Bite    (Consider location/radiation/quality/duration/timing/severity/associated sxs/prior treatment) Patient is a 46 y.o. female presenting with animal bite. The history is provided by the patient. No language interpreter was used.  Animal Bite  The incident occurred just prior to arrival. The incident occurred at work. There is an injury to the nose. Pertinent negatives include no chest pain, no numbness, no visual disturbance, no abdominal pain, no nausea, no vomiting, no hearing loss, no focal weakness, no decreased responsiveness, no light-headedness, no loss of consciousness, no seizures, no tingling, no weakness, no difficulty breathing and no memory loss.  Report spider bite in the parking lot of work prior to arrival to the ER.  States the spider was black.  Unsure other than that.  Denies dizziness  Vomiting muscle spasms cramps chest pain shortness of breath. pmh back pain.  Past Medical History  Diagnosis Date  . Back pain     Past Surgical History  Procedure Date  . Tonsillectomy   . Cesarean section     No family history on file.  History  Substance Use Topics  . Smoking status: Never Smoker   . Smokeless tobacco: Not on file  . Alcohol Use: No    OB History    Grav Para Term Preterm Abortions TAB SAB Ect Mult Living                  Review of Systems  Constitutional: Negative.  Negative for fever and decreased responsiveness.  HENT: Negative for hearing loss.   Eyes: Negative for visual disturbance.  Cardiovascular: Negative.  Negative for chest pain.  Gastrointestinal: Negative.  Negative for nausea, vomiting and abdominal pain.  Skin:       Open wound to end of nose with some serosang drainage.    Neurological: Negative for dizziness, tingling, focal weakness, seizures, loss  of consciousness, weakness, light-headedness and numbness.  Psychiatric/Behavioral: Negative.  Negative for memory loss.    Allergies  Review of patient's allergies indicates no known allergies.  Home Medications   Current Outpatient Rx  Name Route Sig Dispense Refill  . CETIRIZINE HCL 10 MG PO TABS Oral Take 10 mg by mouth daily.    . CEPHALEXIN 500 MG PO CAPS Oral Take 1 capsule (500 mg total) by mouth 4 (four) times daily. 40 capsule 0  . DIPHENHYDRAMINE HCL 25 MG PO CAPS Oral Take 1 capsule (25 mg total) by mouth every 6 (six) hours as needed for itching. 30 capsule 0  . OXYCODONE-ACETAMINOPHEN 5-325 MG PO TABS Oral Take 2 tablets by mouth every 4 (four) hours as needed for pain. 12 tablet 0    BP 133/81  Pulse 85  Temp(Src) 98.2 F (36.8 C) (Oral)  Resp 17  SpO2 100%  LMP 11/04/2011  Physical Exam  Nursing note and vitals reviewed. Constitutional: She is oriented to person, place, and time. She appears well-developed and well-nourished.  HENT:  Head: Normocephalic and atraumatic.  Nose: Sinus tenderness present.    Eyes: Conjunctivae and EOM are normal. Pupils are equal, round, and reactive to light.  Neck: Normal range of motion. Neck supple.  Cardiovascular: Normal rate.   Pulmonary/Chest: Effort normal.  Abdominal: Soft.  Musculoskeletal: Normal range of motion. She exhibits no edema and no tenderness.  Neurological: She  is alert and oriented to person, place, and time. She has normal reflexes.  Skin: Skin is warm and dry.  Psychiatric: She has a normal mood and affect.    ED Course  Procedures (including critical care time)  Labs Reviewed - No data to display No results found.   No diagnosis found.    MDM  S/p spider bite to her nose.  Tetanus updated, rx for keflex, benadryl, zofran .  Return if n/v, dizziness abdominal pain.  Work note. Follow up with pcp from list or return if worse.        Remi Haggard, NP 11/22/11 [phone]

## 2011-11-21 NOTE — Discharge Instructions (Signed)
Ms Bilello start the antibiotics tonight.  Take ibuprofen 800mg  every 6 hours with food  for pain. Take the percocet for severe pain but do not drive with this medication. Use ice on the area intermittently for the next 24 hours. Take Benadryl every 4-6 hours for the next 24 hours. Return to the ER for severe pain shortness of breath nausea vomiting or other concerns. Keep the area clean wash daily with antibacterial soap and apply antibiotic ointment.   Brown Recluse Spider Bite A brown recluse spider may be dark brown to light tan in color. It has a band of darker color shaped like a violin on its back. The whole spider (with legs) may grow to the size of 1 inch (2.5 cm). These spiders live undercover, outdoors, and in out-of-the-way places indoors. They can be found in the [place] on the [address], [place], and mostly in the [place]. Brown recluse spider bites can be serious and life-threatening. SYMPTOMS  Symptoms can get worse over several days and may include:  Pain at the bite site. This may begin as a small, painful blister with redness around it. The pain and sore (lesion) caused by the bite can increase and spread over time. This may result in an area of tissue death up to 12 inches (30 cm) wide.   General feeling of illness (malaise).   Nausea and vomiting.   Fever.   Body aches.  TREATMENT  Your caregiver may prescribe a drug to prevent tissue death or to treat your lesion. If a large lesion develops, surgery may be needed to remove the damaged tissue. Certain medicines and antibiotics may be prescribed depending on the severity of your illness. However, there is no single antidote to treat this bite. Treatment focuses on caring for your wound. HOME CARE INSTRUCTIONS   Do not scratch the bite area. Keep the area clean and covered with an adhesive bandage or sterile gauze bandage.   Wash the area daily in warm, soapy water.   Put ice or cool compresses on the bite area.   Put ice in  a plastic bag.   Place a towel between your skin and the bag.   Leave the ice on for 20 to 30 minutes, 3 to 4 times a day or as directed.   Keep the bite area elevated above the level of your heart. This helps reduce swelling.   Take medicines as directed by your caregiver.  You may need a tetanus shot if:  You cannot remember when you had your last tetanus shot.   You have never had a tetanus shot.   The injury broke your skin.  If you get a tetanus shot, your arm may swell, get red, and feel warm to the touch. This is common and not a problem. If you need a tetanus shot and you choose not to have one, there is a rare chance of getting tetanus. Sickness from tetanus can be serious. SEEK MEDICAL CARE IF:   Your symptoms do not improve in 24 hours or are getting worse.   You have increasing pain in the bite area.  SEEK IMMEDIATE MEDICAL CARE IF:   Your lesion appears to be getting larger (more than  inch [5 mm]), growing deeper, or looks infected.   You have chills or a fever.   You feel nauseous, vomit, have muscle aches, weakness, extreme tiredness, convulsions, or a red rash.   Your urine output decreases.   You have blood in your  urine or notice other unusual bleeding.   Your skin turns yellow.  MAKE SURE YOU:   Understand these instructions.   Will watch your condition.   Will get help right away if you are not doing well or get worse.  Document Released: 07/21/2005 Document Revised: 07/10/2011 Document Reviewed: 02/19/2011 [facility] Patient Information 2012 [place], [place].Brown Recluse Spider Bite A brown recluse spider may be dark brown to light tan in color. It has a band of darker color shaped like a violin on its back. The whole spider (with legs) may grow to the size of 1 inch (2.5 cm). These spiders live undercover, outdoors, and in out-of-the-way places indoors. They can be found in the [place] on the [address], [place], and mostly in the [place]. Brown recluse  spider bites can be serious and life-threatening. SYMPTOMS  Symptoms can get worse over several days and may include:  Pain at the bite site. This may begin as a small, painful blister with redness around it. The pain and sore (lesion) caused by the bite can increase and spread over time. This may result in an area of tissue death up to 12 inches (30 cm) wide.   General feeling of illness (malaise).   Nausea and vomiting.   Fever.   Body aches.  TREATMENT  Your caregiver may prescribe a drug to prevent tissue death or to treat your lesion. If a large lesion develops, surgery may be needed to remove the damaged tissue. Certain medicines and antibiotics may be prescribed depending on the severity of your illness. However, there is no single antidote to treat this bite. Treatment focuses on caring for your wound. HOME CARE INSTRUCTIONS   Do not scratch the bite area. Keep the area clean and covered with an adhesive bandage or sterile gauze bandage.   Wash the area daily in warm, soapy water.   Put ice or cool compresses on the bite area.   Put ice in a plastic bag.   Place a towel between your skin and the bag.   Leave the ice on for 20 to 30 minutes, 3 to 4 times a day or as directed.   Keep the bite area elevated above the level of your heart. This helps reduce swelling.   Take medicines as directed by your caregiver.  You may need a tetanus shot if:  You cannot remember when you had your last tetanus shot.   You have never had a tetanus shot.   The injury broke your skin.  If you get a tetanus shot, your arm may swell, get red, and feel warm to the touch. This is common and not a problem. If you need a tetanus shot and you choose not to have one, there is a rare chance of getting tetanus. Sickness from tetanus can be serious. SEEK MEDICAL CARE IF:   Your symptoms do not improve in 24 hours or are getting worse.   You have increasing pain in the bite area.  SEEK IMMEDIATE  MEDICAL CARE IF:   Your lesion appears to be getting larger (more than  inch [5 mm]), growing deeper, or looks infected.   You have chills or a fever.   You feel nauseous, vomit, have muscle aches, weakness, extreme tiredness, convulsions, or a red rash.   Your urine output decreases.   You have blood in your urine or notice other unusual bleeding.   Your skin turns yellow.  MAKE SURE YOU:   Understand these instructions.  Will watch your condition.   Will get help right away if you are not doing well or get worse.  Document Released: 07/21/2005 Document Revised: 07/10/2011 Document Reviewed: 02/19/2011 [facility] Patient Information 2012 [place], [place].Black Widow Spider Bite The adult female black widow spider has a black body about  inch (1.2 cm) long with an orange-red hourglass shape on her belly. Black widow spider bites can be serious and life-threatening. SYMPTOMS  Symptoms usually develop in 15 minutes to 2 hours after the bite. Symptoms usually peak in 3 hours to 1 day and may include:  Muscle cramps. These may occur anywhere in the body, including the abdomen.   Nausea and vomiting.   Dizziness.   Heavy sweating.   Shaking.   Restlessness.   Fever.   Trouble breathing.   Chest pain.   Swelling or a rash around the bite.  TREATMENT  Serious reactions may require hospitalization. Your caregiver may give you an antivenom injection to help reduce pain. This is only done for very severe reactions that are not effectively treated with other medicines. There is a chance of having an allergic reaction to the antivenom since it is derived from horse serum. HOME CARE INSTRUCTIONS   Do not scratch the bite area. Keep the area clean and covered with an adhesive bandage or sterile gauze bandage.   Wash the area 3 times per day in warm, soapy water or as directed by your caregiver.   Put ice or cool compresses on the bite area.   Put ice in a plastic bag.    Place a towel between your skin and the bag.   Leave the ice on for 20 minutes, 4 times a day for the first 2 days or as directed.   Keep the bite area elevated above the level of your heart. This helps reduce redness and swelling.   Only take over-the-counter or prescription medicines for pain, discomfort, or fever as directed by your caregiver.   Watch the bite area closely for worsening pain, swelling, redness, or pus.  You may need a tetanus shot if:  You cannot remember when you had your last tetanus shot.   You have never had a tetanus shot.   The injury broke your skin.  If you get a tetanus shot, your arm may swell, get red, and feel warm to the touch. This is common and not a problem. If you need a tetanus shot and you choose not to have one, there is a rare chance of getting tetanus. Sickness from tetanus can be serious. SEEK IMMEDIATE MEDICAL CARE IF:   You have muscle cramps or abdominal pain or cramps.   You develop rapid breathing or a rapid heartbeat.   You become extremely restless or confused.   You have chest pain.   You feel faint, lightheaded, or you feel generally ill (malaise).   You develop pain, soreness, redness, swelling, or drainage from the bite.   You have a fever.  MAKE SURE YOU:   Understand these instructions.   Will watch your condition.   Will get help right away if you are not doing well or get worse.  Document Released: 07/21/2005 Document Revised: 07/10/2011 Document Reviewed: 02/19/2011 [facility] Patient Information 2012 [place], [place].Black Widow Spider Bite The adult female black widow spider has a black body about  inch (1.2 cm) long with an orange-red hourglass shape on her belly. Black widow spider bites can be serious and life-threatening. SYMPTOMS  Symptoms usually develop in 15  minutes to 2 hours after the bite. Symptoms usually peak in 3 hours to 1 day and may include:  Muscle cramps. These may occur anywhere in the  body, including the abdomen.   Nausea and vomiting.   Dizziness.   Heavy sweating.   Shaking.   Restlessness.   Fever.   Trouble breathing.   Chest pain.   Swelling or a rash around the bite.  TREATMENT  Serious reactions may require hospitalization. Your caregiver may give you an antivenom injection to help reduce pain. This is only done for very severe reactions that are not effectively treated with other medicines. There is a chance of having an allergic reaction to the antivenom since it is derived from horse serum. HOME CARE INSTRUCTIONS   Do not scratch the bite area. Keep the area clean and covered with an adhesive bandage or sterile gauze bandage.   Wash the area 3 times per day in warm, soapy water or as directed by your caregiver.   Put ice or cool compresses on the bite area.   Put ice in a plastic bag.   Place a towel between your skin and the bag.   Leave the ice on for 20 minutes, 4 times a day for the first 2 days or as directed.   Keep the bite area elevated above the level of your heart. This helps reduce redness and swelling.   Only take over-the-counter or prescription medicines for pain, discomfort, or fever as directed by your caregiver.   Watch the bite area closely for worsening pain, swelling, redness, or pus.  You may need a tetanus shot if:  You cannot remember when you had your last tetanus shot.   You have never had a tetanus shot.   The injury broke your skin.  If you get a tetanus shot, your arm may swell, get red, and feel warm to the touch. This is common and not a problem. If you need a tetanus shot and you choose not to have one, there is a rare chance of getting tetanus. Sickness from tetanus can be serious. SEEK IMMEDIATE MEDICAL CARE IF:   You have muscle cramps or abdominal pain or cramps.   You develop rapid breathing or a rapid heartbeat.   You become extremely restless or confused.   You have chest pain.   You feel  faint, lightheaded, or you feel generally ill (malaise).   You develop pain, soreness, redness, swelling, or drainage from the bite.   You have a fever.  MAKE SURE YOU:   Understand these instructions.   Will watch your condition.   Will get help right away if you are not doing well or get worse.  Document Released: 07/21/2005 Document Revised: 07/10/2011 Document Reviewed: 02/19/2011 [facility] Patient Information 2012 [place], [place].

## 2011-11-21 NOTE — ED Notes (Signed)
Nose cleaned with saline and bacitracin applied.

## 2011-11-24 NOTE — ED Provider Notes (Signed)
Medical screening examination/treatment/procedure(s) were performed by non-physician practitioner and as supervising physician I was immediately available for consultation/collaboration.   Laray Anger, DO 11/24/11 0201

## 2012-02-16 ENCOUNTER — Encounter (HOSPITAL_COMMUNITY): Payer: Self-pay | Admitting: *Deleted

## 2012-02-16 ENCOUNTER — Emergency Department (HOSPITAL_COMMUNITY)
Admission: EM | Admit: 2012-02-16 | Discharge: 2012-02-16 | Disposition: A | Discharge status: Home / Self Care | Payer: Self-pay | Attending: Emergency Medicine | Admitting: Emergency Medicine

## 2012-02-16 DIAGNOSIS — M545 Low back pain, unspecified: Secondary | ICD-10-CM | POA: Insufficient documentation

## 2012-02-16 DIAGNOSIS — M549 Dorsalgia, unspecified: Secondary | ICD-10-CM

## 2012-02-16 DIAGNOSIS — M7060 Trochanteric bursitis, unspecified hip: Secondary | ICD-10-CM

## 2012-02-16 DIAGNOSIS — G8929 Other chronic pain: Secondary | ICD-10-CM | POA: Insufficient documentation

## 2012-02-16 MED ORDER — DIAZEPAM 5 MG PO TABS: 5.0000 mg | ORAL_TABLET | Freq: Three times a day (TID) | ORAL | Status: AC | PRN

## 2012-02-16 MED ORDER — KETOROLAC TROMETHAMINE 60 MG/2ML IM SOLN
60.0000 mg | Freq: Once | INTRAMUSCULAR | Status: AC
  Administered 2012-02-16: 60 mg via INTRAMUSCULAR
  Filled 2012-02-16: qty 2

## 2012-02-16 MED ORDER — HYDROCODONE-ACETAMINOPHEN 5-325 MG PO TABS: 1.0000 | ORAL_TABLET | ORAL | Status: AC | PRN

## 2012-02-16 MED ORDER — ACETAMINOPHEN 325 MG PO TABS
650.0000 mg | ORAL_TABLET | Freq: Once | ORAL | Status: AC
  Administered 2012-02-16: 650 mg via ORAL
  Filled 2012-02-16: qty 2

## 2012-02-16 MED ORDER — IBUPROFEN 800 MG PO TABS: 800.0000 mg | ORAL_TABLET | Freq: Two times a day (BID) | ORAL | Status: AC

## 2012-02-16 NOTE — ED Provider Notes (Signed)
History     CSN: [id number]  Arrival date & time 02/16/12  2956   First MD Initiated Contact with Patient 02/16/12 1026      Chief Complaint  Patient presents with  . Back Pain    (Consider location/radiation/quality/duration/timing/severity/associated sxs/prior treatment) HPI Comments: Patient with a history of back pain presents emergency department with chief complaint of lower lumbar pain.  Onset of pain began Thursday evening is located in the lower lumbar region with radiation down left leg as well as right hip to groin.  Patient states that pain is worsened when laying on right hip.  Severity is 8/10, described as sharp pains worsened with movement and pressure.  Patient denies fevers, night sweats, chills, loss control of bowel or bladder, extremity weakness numbness or tingling, urinary frequency, dysuria, hematuria, cancer history or IV drug use.  Patient states she was on prednisone for about a week last month for a mother back pain exacerbation.  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  Patient has no other complaints at this time.  Patient is a 46 y.o. female presenting with back pain. The history is provided by the patient.  Back Pain  Pertinent negatives include no chest pain, no fever, no numbness, no headaches, no abdominal pain and no weakness.    Past Medical History  Diagnosis Date  . Back pain     Past Surgical History  Procedure Date  . Tonsillectomy   . Cesarean section     History reviewed. No pertinent family history.  History  Substance Use Topics  . Smoking status: Never Smoker   . Smokeless tobacco: Not on file  . Alcohol Use: No    OB History    Grav Para Term Preterm Abortions TAB SAB Ect Mult Living                  Review of Systems  Constitutional: Positive for activity change. Negative for fever, chills, fatigue and unexpected weight change.  HENT: Negative for neck  pain and neck stiffness.   Eyes: Negative for visual disturbance.  Respiratory: Negative for shortness of breath.   Cardiovascular: Negative for chest pain and leg swelling.  Gastrointestinal: Negative for nausea, abdominal pain, constipation and rectal pain.  Genitourinary: Negative for urgency and difficulty urinating.       Patient denies bowel and bladder incontinence.  Musculoskeletal: Positive for back pain and gait problem. Negative for myalgias, joint swelling and arthralgias.  Neurological: Negative for weakness, numbness and headaches.  All other systems reviewed and are negative.    Allergies  Review of patient's allergies indicates no known allergies.  Home Medications   Current Outpatient Rx  Name Route Sig Dispense Refill  . ASPIRIN-ACETAMINOPHEN-CAFFEINE 250-250-65 MG PO TABS Oral Take 1 tablet by mouth every 6 (six) hours as needed. For migraine    . CYCLOBENZAPRINE HCL 5 MG PO TABS Oral Take 5 mg by mouth 3 (three) times daily as needed. For pain      BP 123/77  Pulse 116  Temp 98.5 F (36.9 C) (Oral)  Resp 18  SpO2 100%  Physical Exam  Nursing note and vitals reviewed. Constitutional: She is oriented to person, place, and time. She appears well-developed and well-nourished. No distress.  HENT:  Head: Normocephalic and atraumatic.  Eyes: Conjunctivae and EOM are normal. Pupils are equal, round, and reactive to light. No scleral icterus.  Neck: Normal range of  motion and full passive range of motion without pain. Neck supple. No spinous process tenderness and no muscular tenderness present. No rigidity. Normal range of motion present. No Brudzinski's sign noted.  Cardiovascular: Normal rate, regular rhythm and intact distal pulses.  Exam reveals no gallop and no friction rub.   No murmur heard. Pulmonary/Chest: Effort normal and breath sounds normal. No respiratory distress. She has no wheezes. She has no rales. She exhibits no tenderness.  Musculoskeletal:        Right hip: She exhibits tenderness.       Cervical back: She exhibits normal range of motion, no tenderness, no bony tenderness and no pain.       Thoracic back: She exhibits no tenderness, no bony tenderness and no pain.       Lumbar back: She exhibits tenderness, bony tenderness and pain. She exhibits no spasm and normal pulse.       Right foot: She exhibits no swelling.       Left foot: She exhibits no swelling.       Bilateral lower extremities nontender without color change, baseline range of motion of extremities with intact distal pulses, capillary refill less than 2 seconds bilaterally.  Pt has increased pain w ROM of lumbar spine. Pain w ambulation, no sign of ataxia. Severe ttp over right trochanteric region. Left straight leg test positive.   Neurological: She is alert and oriented to person, place, and time. She has normal strength and normal reflexes. No sensory deficit. Gait (no ataxia, slowed and hunched d/t pain ) abnormal.       Sensation at baseline for light touch in all 4 distal extremities, motor symmetric & bilateral 5/5 (hips: abduction, adduction, flexion; knee: flexion & extension; foot: dorsiflexion, plantar flexion, toes: dorsi flexion) Patellar & ankle reflexes intact.   Skin: Skin is warm and dry. No rash noted. She is not diaphoretic. No erythema. No pallor.  Psychiatric: She has a normal mood and affect.    ED Course  Procedures (including critical care time)  Labs Reviewed - No data to display No results found.   No diagnosis found.    MDM  Chronic lumbar pain exacerbation with possible trochanteric bursitis.   Patient with back pain.  No neurological deficits and normal neuro exam.  Patient can walk but states is painful.  No loss of bowel or bladder control.  No concern for cauda equina.  No fever, night sweats, weight loss, h/o cancer, IVDU. Pt able to ambulate in ED without difficulty. Hemodynamically stable in NAD prior to dc. Discussed dx and tx  with pt. Advised heat therapy, stretching, and NSAID use. Pt to f-u with ortho if symptoms persist. Pt is driving today so no narcotics or muscle relaxer were given           Jaci Carrel, PA-C 02/16/12 1126

## 2012-02-16 NOTE — ED Notes (Signed)
Pt reports lower back pain radiating down bilateral hips down into left leg. Reports tingling to left leg. Reports hx of lower back pain. No urinary symptoms.

## 2012-02-16 NOTE — ED Provider Notes (Signed)
Medical screening examination/treatment/procedure(s) were performed by non-physician practitioner and as supervising physician I was immediately available for consultation/collaboration.  Cheri Guppy, MD 02/16/12 [phone]

## 2012-08-28 ENCOUNTER — Emergency Department (HOSPITAL_COMMUNITY): Payer: Self-pay

## 2012-08-28 ENCOUNTER — Emergency Department (HOSPITAL_COMMUNITY)
Admission: EM | Admit: 2012-08-28 | Discharge: 2012-08-28 | Disposition: A | Discharge status: Home / Self Care | Payer: Self-pay | Attending: Emergency Medicine | Admitting: Emergency Medicine

## 2012-08-28 ENCOUNTER — Encounter (HOSPITAL_COMMUNITY): Payer: Self-pay | Admitting: *Deleted

## 2012-08-28 DIAGNOSIS — R197 Diarrhea, unspecified: Secondary | ICD-10-CM | POA: Insufficient documentation

## 2012-08-28 DIAGNOSIS — Z8739 Personal history of other diseases of the musculoskeletal system and connective tissue: Secondary | ICD-10-CM | POA: Insufficient documentation

## 2012-08-28 DIAGNOSIS — R079 Chest pain, unspecified: Secondary | ICD-10-CM | POA: Insufficient documentation

## 2012-08-28 DIAGNOSIS — R05 Cough: Secondary | ICD-10-CM | POA: Insufficient documentation

## 2012-08-28 DIAGNOSIS — Z79899 Other long term (current) drug therapy: Secondary | ICD-10-CM | POA: Insufficient documentation

## 2012-08-28 DIAGNOSIS — R1110 Vomiting, unspecified: Secondary | ICD-10-CM | POA: Insufficient documentation

## 2012-08-28 DIAGNOSIS — Z3202 Encounter for pregnancy test, result negative: Secondary | ICD-10-CM | POA: Insufficient documentation

## 2012-08-28 DIAGNOSIS — R059 Cough, unspecified: Secondary | ICD-10-CM | POA: Insufficient documentation

## 2012-08-28 DIAGNOSIS — R509 Fever, unspecified: Secondary | ICD-10-CM | POA: Insufficient documentation

## 2012-08-28 LAB — COMPREHENSIVE METABOLIC PANEL
Alkaline Phosphatase: 77 U/L (ref 39–117)
BUN: 12 mg/dL (ref 6–23)
Chloride: 98 mEq/L (ref 96–112)
GFR calc Af Amer: 65 mL/min — ABNORMAL LOW (ref >90)
GFR calc non Af Amer: 56 mL/min — ABNORMAL LOW (ref >90)
Glucose, Bld: 90 mg/dL (ref 70–99)
Potassium: 4.1 mEq/L (ref 3.5–5.1)
Total Bilirubin: 0.2 mg/dL — ABNORMAL LOW (ref 0.3–1.2)
Total Protein: 8 g/dL (ref 6.0–8.3)

## 2012-08-28 LAB — URINALYSIS, ROUTINE W REFLEX MICROSCOPIC
Ketones, ur: NEGATIVE mg/dL
Leukocytes, UA: NEGATIVE
Nitrite: NEGATIVE
Protein, ur: NEGATIVE mg/dL
Urobilinogen, UA: 0.2 mg/dL (ref 0.0–1.0)

## 2012-08-28 LAB — CBC WITH DIFFERENTIAL/PLATELET
Eosinophils Absolute: 0.1 10*3/uL (ref 0.0–0.7)
Hemoglobin: 12.2 g/dL (ref 12.0–15.0)
Lymphs Abs: 2.8 10*3/uL (ref 0.7–4.0)
MCH: 27.1 pg (ref 26.0–34.0)
Monocytes Relative: 6 % (ref 3–12)
Neutrophils Relative %: 50 % (ref 43–77)
RBC: 4.5 MIL/uL (ref 3.87–5.11)

## 2012-08-28 LAB — URINE MICROSCOPIC-ADD ON

## 2012-08-28 LAB — POCT PREGNANCY, URINE: Preg Test, Ur: NEGATIVE

## 2012-08-28 MED ORDER — SODIUM CHLORIDE 0.9 % IV BOLUS (SEPSIS)
1000.0000 mL | Freq: Once | INTRAVENOUS | Status: AC
  Administered 2012-08-28: 1000 mL via INTRAVENOUS

## 2012-08-28 MED ORDER — PROMETHAZINE HCL 25 MG RE SUPP: 25.0000 mg | Freq: Four times a day (QID) | RECTAL | Status: DC | PRN

## 2012-08-28 MED ORDER — ONDANSETRON HCL 4 MG/2ML IJ SOLN
INTRAMUSCULAR | Status: AC
  Filled 2012-08-28: qty 2

## 2012-08-28 MED ORDER — ONDANSETRON HCL 4 MG/2ML IJ SOLN
4.0000 mg | Freq: Once | INTRAMUSCULAR | Status: AC
  Administered 2012-08-28: 4 mg via INTRAVENOUS

## 2012-08-28 MED ORDER — ONDANSETRON HCL 4 MG PO TABS: 4.0000 mg | ORAL_TABLET | Freq: Four times a day (QID) | ORAL | Status: DC

## 2012-08-28 MED ORDER — ONDANSETRON 4 MG PO TBDP
4.0000 mg | ORAL_TABLET | Freq: Once | ORAL | Status: AC
  Administered 2012-08-28: 4 mg via ORAL
  Filled 2012-08-28: qty 1

## 2012-08-28 MED ORDER — PROMETHAZINE HCL 25 MG/ML IJ SOLN
12.5000 mg | Freq: Once | INTRAMUSCULAR | Status: AC
  Administered 2012-08-28: 12.5 mg via INTRAVENOUS
  Filled 2012-08-28 (×2): qty 1

## 2012-08-28 NOTE — ED Notes (Signed)
Pt is here for nausea, vomiting and diarrhea with body aches since Thursday.  Pt states that she had a fever or 101 this am.  Pt denies any GU symptoms

## 2012-08-28 NOTE — ED Notes (Signed)
I gave the patient a cup of ice water. 

## 2012-08-28 NOTE — ED Provider Notes (Signed)
I saw and evaluated the patient, reviewed the resident's note and I agree with the findings and plan.   Quanetta Truss, MD 08/28/12 2358 

## 2012-08-28 NOTE — ED Notes (Signed)
Attempted to PO challenge pt, pt was unable to keep PO fluids down.  Notified EDP [place], obtained verbal order for NS bolus.

## 2012-08-28 NOTE — ED Notes (Signed)
Pt back from x-ray.

## 2012-08-28 NOTE — ED Notes (Signed)
Patient transported to X-ray 

## 2012-08-28 NOTE — ED Notes (Addendum)
Pt st's the IV zofran helped her nausea, however pt still unable to hold down PO fluids.  EDP resident [place] notified.

## 2012-08-28 NOTE — ED Provider Notes (Signed)
History     CSN: [id number]  Arrival date & time 08/28/12  1839   First MD Initiated Contact with Patient 08/28/12 1857      Chief Complaint  Patient presents with  . Emesis  . Diarrhea    (Consider location/radiation/quality/duration/timing/severity/associated sxs/prior treatment) HPI Comments: 47 y/o F with emesis and diarrhea x3 days. NBNB. Decreased po 2/2 emesis. No abdominal pain. Occasional central chest "discomfort" with coughing only. Mild cough. Occasionally productive of sputum. NB. CP non-radiating. No associated diaphoresis or SOB. Non-smoker. No h/o heart dz  Patient is a 47 y.o. female presenting with vomiting and diarrhea.  Emesis  This is a new problem. The current episode started more than 2 days ago. The problem occurs 5 to 10 times per day. The problem has been gradually worsening. The emesis has an appearance of stomach contents. Maximum temperature: fever 101 today. Associated symptoms include chills (today), cough, diarrhea and a fever (took tylenol about 6 hours ago). Pertinent negatives include no abdominal pain, no headaches and no URI. 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  Diarrhea The primary symptoms include fever (took tylenol about 6 hours ago), vomiting and diarrhea. Primary symptoms do not include abdominal pain, nausea, dysuria or rash.  The illness is also significant for chills (today). The illness does not include back pain.    Past Medical History  Diagnosis Date  . Back pain     Past Surgical History  Procedure Date  . Tonsillectomy   . Cesarean section     No family history on file.  History  Substance Use Topics  . Smoking status: Never Smoker   . Smokeless tobacco: Not on file  . Alcohol Use: No    OB History    Grav Para Term Preterm Abortions TAB SAB Ect Mult Living                  Review of Systems  Constitutional: Positive for fever (took tylenol about 6 hours ago),  chills (today) and appetite change.  HENT: Negative for congestion and rhinorrhea.   Eyes: Negative for pain and visual disturbance.  Respiratory: Positive for cough. Negative for shortness of breath.   Cardiovascular: Positive for chest pain. Negative for leg swelling.  Gastrointestinal: Positive for vomiting and diarrhea. Negative for nausea and abdominal pain.  Genitourinary: Negative for dysuria, hematuria, flank pain and difficulty urinating.  Musculoskeletal: Negative for back pain.  Skin: Negative for color change and rash.  Neurological: Negative for dizziness and headaches.  All other systems reviewed and are negative.    Allergies  Review of patient's allergies indicates no known allergies.  Home Medications   Current Outpatient Rx  Name  Route  Sig  Dispense  Refill  . ACETAMINOPHEN 500 MG PO TABS   Oral   Take 1,000 mg by mouth 2 (two) times daily as needed. For pain         . NYQUIL PO   Oral   Take 30 mLs by mouth at bedtime as needed. For cold and flu symptoms         . ONDANSETRON HCL 4 MG PO TABS   Oral   Take 1 tablet (4 mg total) by mouth every 6 (six) hours.   20 tablet   0   . PROMETHAZINE HCL 25 MG RE SUPP   Rectal   Place 1 suppository (25 mg total) rectally every 6 (six) hours as needed for nausea.  12 each   0     BP 138/85  Pulse 73  Temp 98.8 F (37.1 C) (Oral)  Resp 19  Ht 5\' 6"  (1.676 m)  Wt 160 lb (72.576 kg)  BMI 25.82 kg/m2  SpO2 100%  LMP 08/04/2012  Physical Exam  Nursing note and vitals reviewed. Constitutional: She is oriented to person, place, and time. She appears well-developed and well-nourished. No distress.  HENT:  Head: Normocephalic and atraumatic.  Eyes: Conjunctivae normal are normal. Right eye exhibits no discharge. Left eye exhibits no discharge.  Neck: No tracheal deviation present.  Cardiovascular: Normal rate, regular rhythm, normal heart sounds and intact distal pulses.   Pulmonary/Chest: Effort  normal and breath sounds normal. No stridor. No respiratory distress. She has no wheezes. She has no rales.  Abdominal: Soft. She exhibits no distension. There is no tenderness. There is no rigidity and no guarding.  Musculoskeletal: She exhibits no edema and no tenderness.  Neurological: She is alert and oriented to person, place, and time.  Skin: Skin is warm and dry.  Psychiatric: She has a normal mood and affect. Her behavior is normal.    ED Course  Procedures (including critical care time)  Labs Reviewed  COMPREHENSIVE METABOLIC PANEL - Abnormal; Notable for the following:    Creatinine, Ser 1.15 (*)     Total Bilirubin 0.2 (*)     GFR calc non Af Amer 56 (*)     GFR calc Af Amer 65 (*)     All other components within normal limits  URINALYSIS, ROUTINE W REFLEX MICROSCOPIC - Abnormal; Notable for the following:    APPearance CLOUDY (*)     Specific Gravity, Urine 1.036 (*)     Hgb urine dipstick MODERATE (*)     Bilirubin Urine SMALL (*)     All other components within normal limits  URINE MICROSCOPIC-ADD ON - Abnormal; Notable for the following:    Squamous Epithelial / LPF MANY (*)     Bacteria, UA MANY (*)     Crystals CA OXALATE CRYSTALS (*)     All other components within normal limits  CBC WITH DIFFERENTIAL  POCT PREGNANCY, URINE   Dg Chest 2 View  08/28/2012  *RADIOLOGY REPORT*  Clinical Data: 47 year old female with cough fever and malaise.  CHEST - 2 VIEW  Comparison: 03/07/2009.  Findings: Mildly lower lung volumes.  Cardiac size and mediastinal contours are within normal limits.  Visualized tracheal air column is within normal limits.  No pneumothorax, pulmonary edema, pleural effusion or consolidation.  Mild crowding of markings at the lung bases.  No convincing acute airspace disease. No acute osseous abnormality identified.  IMPRESSION: Lower lung volumes. No acute cardiopulmonary abnormality.   Original Report Authenticated By: Erskine Speed, M.D.      1.  Emesis   2. Diarrhea      Date: 08/28/2012  Rate: 79  Rhythm: normal sinus rhythm  QRS Axis: normal  Intervals: normal  ST/T Wave abnormalities: normal  Conduction Disutrbances:none  Narrative Interpretation:   Old EKG Reviewed: none available    MDM    47 y/o F p/w emesis and diarrhea. Multiple sick contacts at work with same.  HDS, af. NAD. Non-toxic. abd exam benign. Symptoms improved after zofran and phenergan. IV fluids. HDS. Patient tolerating po without emesis. Likely viral illness. Emesis and diarrhea. No abd pain Doubt Appendicitis, Bowel Obstruction, AAA, UTI, Pyelonephritis, Nephrolithiasis, Pancreatitis, Cholecystitis, Shingles, Perforated Bowel or Ulcer, Diverticulosis/itis, Ischemic Mesentery, Inflammatory Bowel Disease,  Strangulated/Incarcerated Hernia, or other abdominal emergency as this would be inconsistent with history and physical, low risk, other diagnosis much more likely.   Dc home, return precautions given, follow up with primary care provider, patient in agreement with plan.  Labs and imaging reviewed by myself and considered in medical decision making if ordered. Imaging interpreted by radiology.   Discussed case with Dr. Ranae Palms who is in agreement with assessment and plan.   New Prescriptions   ONDANSETRON (ZOFRAN) 4 MG TABLET    Take 1 tablet (4 mg total) by mouth every 6 (six) hours.   PROMETHAZINE (PHENERGAN) 25 MG SUPPOSITORY    Place 1 suppository (25 mg total) rectally every 6 (six) hours as needed for nausea.         Stevie Kern, MD 08/28/12 [phone]

## 2012-08-28 NOTE — ED Notes (Signed)
Pt able to keep down PO fluids after the IV phenergan.  EDP resident Ajo notified.

## 2012-12-16 ENCOUNTER — Observation Stay (HOSPITAL_COMMUNITY)
Admission: EM | Admit: 2012-12-16 | Discharge: 2012-12-17 | Disposition: A | Discharge status: Home / Self Care | Payer: Self-pay | Attending: Internal Medicine | Admitting: Internal Medicine

## 2012-12-16 ENCOUNTER — Encounter (HOSPITAL_COMMUNITY): Payer: Self-pay | Admitting: *Deleted

## 2012-12-16 ENCOUNTER — Emergency Department (HOSPITAL_COMMUNITY): Payer: Self-pay

## 2012-12-16 DIAGNOSIS — R000 Tachycardia, unspecified: Secondary | ICD-10-CM | POA: Insufficient documentation

## 2012-12-16 DIAGNOSIS — N289 Disorder of kidney and ureter, unspecified: Secondary | ICD-10-CM

## 2012-12-16 DIAGNOSIS — R079 Chest pain, unspecified: Secondary | ICD-10-CM

## 2012-12-16 DIAGNOSIS — R0789 Other chest pain: Principal | ICD-10-CM | POA: Insufficient documentation

## 2012-12-16 DIAGNOSIS — D509 Iron deficiency anemia, unspecified: Secondary | ICD-10-CM | POA: Insufficient documentation

## 2012-12-16 DIAGNOSIS — R42 Dizziness and giddiness: Secondary | ICD-10-CM | POA: Insufficient documentation

## 2012-12-16 HISTORY — DX: Iron deficiency anemia, unspecified: D50.9

## 2012-12-16 HISTORY — DX: Migraine, unspecified, not intractable, without status migrainosus: G43.909

## 2012-12-16 LAB — MRSA PCR SCREENING: MRSA by PCR: NEGATIVE

## 2012-12-16 LAB — POCT I-STAT TROPONIN I: Troponin i, poc: 0 ng/mL (ref 0.00–0.08)

## 2012-12-16 LAB — COMPREHENSIVE METABOLIC PANEL
ALT: 11 U/L (ref 0–35)
BUN: 14 mg/dL (ref 6–23)
CO2: 25 mEq/L (ref 19–32)
Calcium: 9.8 mg/dL (ref 8.4–10.5)
Creatinine, Ser: 1.18 mg/dL — ABNORMAL HIGH (ref 0.50–1.10)
GFR calc Af Amer: 63 mL/min — ABNORMAL LOW (ref >90)
GFR calc non Af Amer: 54 mL/min — ABNORMAL LOW (ref >90)
Glucose, Bld: 90 mg/dL (ref 70–99)
Total Protein: 7.7 g/dL (ref 6.0–8.3)

## 2012-12-16 LAB — CBC
Hemoglobin: 11.4 g/dL — ABNORMAL LOW (ref 12.0–15.0)
MCH: 26.8 pg (ref 26.0–34.0)
MCHC: 34.1 g/dL (ref 30.0–36.0)
MCV: 78.4 fL (ref 78.0–100.0)
RBC: 4.26 MIL/uL (ref 3.87–5.11)

## 2012-12-16 MED ORDER — ACETAMINOPHEN 325 MG PO TABS
650.0000 mg | ORAL_TABLET | Freq: Four times a day (QID) | ORAL | Status: DC | PRN
  Administered 2012-12-16 – 2012-12-17 (×2): 650 mg via ORAL
  Filled 2012-12-16 (×2): qty 2

## 2012-12-16 MED ORDER — MORPHINE SULFATE 2 MG/ML IJ SOLN
2.0000 mg | INTRAMUSCULAR | Status: DC | PRN
  Administered 2012-12-16: 2 mg via INTRAVENOUS
  Filled 2012-12-16: qty 1

## 2012-12-16 MED ORDER — SODIUM CHLORIDE 0.9 % IJ SOLN
3.0000 mL | Freq: Two times a day (BID) | INTRAMUSCULAR | Status: DC
  Administered 2012-12-17: 3 mL via INTRAVENOUS

## 2012-12-16 MED ORDER — NITROGLYCERIN 0.4 MG SL SUBL
0.4000 mg | SUBLINGUAL_TABLET | SUBLINGUAL | Status: DC | PRN
  Administered 2012-12-16 – 2012-12-17 (×2): 0.4 mg via SUBLINGUAL

## 2012-12-16 MED ORDER — ONDANSETRON HCL 4 MG/2ML IJ SOLN: 4.0000 mg | Freq: Four times a day (QID) | INTRAMUSCULAR | Status: DC | PRN

## 2012-12-16 MED ORDER — SODIUM CHLORIDE 0.9 % IV SOLN
INTRAVENOUS | Status: DC
  Administered 2012-12-16: 18:00:00 via INTRAVENOUS

## 2012-12-16 MED ORDER — ENOXAPARIN SODIUM 40 MG/0.4ML ~~LOC~~ SOLN
40.0000 mg | SUBCUTANEOUS | Status: DC
  Administered 2012-12-16: 40 mg via SUBCUTANEOUS
  Filled 2012-12-16 (×2): qty 0.4

## 2012-12-16 MED ORDER — ONDANSETRON HCL 4 MG PO TABS: 4.0000 mg | ORAL_TABLET | Freq: Four times a day (QID) | ORAL | Status: DC | PRN

## 2012-12-16 MED ORDER — ACETAMINOPHEN 650 MG RE SUPP: 650.0000 mg | Freq: Four times a day (QID) | RECTAL | Status: DC | PRN

## 2012-12-16 NOTE — H&P (Signed)
[facility] History and Physical  BERKLEIGH BECKLES XLK:[id number] DOB: 10-27-1965 DOA: 12/16/2012   PCP: No PCP Per Patient   Chief Complaint: chest pain  HPI:  47 year old female with no chronic major medical problems presents with sudden onset of dull substernal chest discomfort when she was at work today. The patient stated that it started out as some left arm pain and left finger tingling associated with the chest discomfort. She also became dizzy. There is no syncope. Vital signs are noted that the patient was tachycardic with a heart rate of 128 and blood pressure 140/90. The patient denies any fevers, chills, nausea, vomiting, coughing, shortness of breath, jaw pain. She has not had any recent injury or, to her cervical spine. She denies any dysuria, hematuria. The patient's weight has been stable. The patient states that this has never occurred in the past.  The patient has not seen a physician in over 5 years. She takes when necessary Zyrtec and Excedrin Migraine. She denies any other over-the-counter medications or supplements. The patient does not smoke, alcohol, or use illegal drugs. Her mother did have a myocardial infarction at age 22. No other family history of coronary disease. The patient's daughter states that many years ago, the patient did have a cholesterol in the 290 range. In emergency department, the patient was noted to have mild renal insufficiency with a serum creatinine of 1.18 and mild anemia with hemoglobin 11.4. Initial troponin was negative. EKG was negative. Hepatic enzymes were negative. Assessment/Plan: Atypical chest pain -Admit patient for observation -Cycle troponins -Urine drug screen -Check TSH -Check hemoglobin A1c -Check morning lipids Dizziness -Orthostatic vitals -Gentle hydration x1 L Anemia, microcytic -Check iron studies Renal insufficiency -Will give a fluid challenge        Past Medical History  Diagnosis Date  . Back pain     Past Surgical History  Procedure Laterality Date  . Tonsillectomy    . Cesarean section     Social History:  reports that she has never smoked. She does not have any smokeless tobacco history on file. She reports that she does not drink alcohol or use illicit drugs.   Family history: Mother had myocardial infarction at age 54. Father is healthy.  No Known Allergies    Prior to Admission medications   Medication Sig Start Date End Date Taking? Authorizing Provider  cetirizine (ZYRTEC) 10 MG tablet Take 10 mg by mouth as needed for allergies.   Yes Historical Provider, MD  Pseudoeph-Doxylamine-DM-APAP (NYQUIL PO) Take 30 mLs by mouth at bedtime as needed. For cold and flu symptoms   Yes Historical Provider, MD  Soft Lens Products (CLEAR EYES CONTACT LENS RELIEF) SOLN Place 1 drop into the right eye as needed (dryness).   Yes Historical Provider, MD    Review of Systems:  Constitutional:  No weight loss, night sweats, Fevers, chills, fatigue.  Head&Eyes: No headache.  No vision loss.  No eye pain or scotoma ENT:  No Difficulty swallowing,Tooth/dental problems,Sore throat,  No ear ache, post nasal drip,  Cardio-vascular:  No chest pain, Orthopnea, PND, swelling in lower extremities, palpitations  GI:  No  abdominal pain, nausea, vomiting, diarrhea, loss of appetite, hematochezia, melena, heartburn, indigestion, Resp:  No shortness of breath with exertion or at rest. No cough. No coughing up of blood .No wheezing.No chest wall deformity  Skin:  no rash or lesions.  GU:  no dysuria, change in color of urine, no urgency or frequency. No flank pain.  Musculoskeletal:  No joint pain or swelling. No decreased range of motion. No back pain.  Psych:  No change in mood or affect. No depression or anxiety. Neurologic: No headache, no dysesthesia, no focal weakness, no vision loss. No syncope  Physical Exam: Filed Vitals:   12/16/12 1500 12/16/12 1515 12/16/12 1530 12/16/12 1545   BP: 123/95 118/73 118/72 107/77  Pulse: 74 72 79 74  Temp:      TempSrc:      Resp:      SpO2: 99% 99% 100% 99%   General:  A&O x 3, NAD, nontoxic, pleasant/cooperative Head/Eye: No conjunctival hemorrhage, no icterus, [place]/AT, No nystagmus ENT:  No icterus,  No thrush, poor dentition, no pharyngeal exudate Neck:  No masses, no lymphadenpathy, no bruits CV:  RRR, no rub, no gallop, no S3 Lung:  CTAB, good air movement, no wheeze, no rhonchi Abdomen: soft/NT, +BS, nondistended, no peritoneal signs Ext: No cyanosis, No rashes, No petechiae, No lymphangitis, No edema   Labs on Admission:  Basic Metabolic Panel:  Recent Labs Lab 12/16/12 1432  NA 136  K 4.2  CL 102  CO2 25  GLUCOSE 90  BUN 14  CREATININE 1.18*  CALCIUM 9.8   Liver Function Tests:  Recent Labs Lab 12/16/12 1432  AST 13  ALT 11  ALKPHOS 59  BILITOT 0.2*  PROT 7.7  ALBUMIN 3.9   No results found for this basename: LIPASE, AMYLASE,  in the last 168 hours No results found for this basename: AMMONIA,  in the last 168 hours CBC:  Recent Labs Lab 12/16/12 1432  WBC 6.1  HGB 11.4*  HCT 33.4*  MCV 78.4  PLT 282   Cardiac Enzymes: No results found for this basename: CKTOTAL, CKMB, CKMBINDEX, TROPONINI,  in the last 168 hours BNP: No components found with this basename: POCBNP,  CBG: No results found for this basename: GLUCAP,  in the last 168 hours  Radiological Exams on Admission: Dg Chest 2 View  12/16/2012   *RADIOLOGY REPORT*  Clinical Data: Chest pressure. Shortness of breath.  CHEST - 2 VIEW  Comparison: 08/28/2012  Findings: Cardiac and mediastinal contours appear normal.  The lungs appear clear.  No pleural effusion is identified.  IMPRESSION:  No significant abnormality identified.   Original Report Authenticated By: Gaylyn Rong, M.D.    EKG: Independently reviewed. Sinus rhythm, normal axis, no ST to T wave change    Time spent:60 minutes Code Status:   Full Family  Communication:   duaghter at bedside   Shmiel Morton, DO  [facility] Pager [phone]  If 7PM-7AM, please contact night-coverage [email or web address] Password [facility] 12/16/2012, 4:18 PM

## 2012-12-16 NOTE — ED Notes (Signed)
Per EMS pt from work with c/o chest pain x 45 minutes. Reports pain as pressure. Pt given 324 ASA. Pt given nitro SL x 2. Pain radiates from left arm with left arm numbness. VS BP 122/89 HR NSR 93. IV 20G LAC.

## 2012-12-16 NOTE — ED Notes (Addendum)
Pt states nitro is not giving her any relief from cp only giving her headache. Pt states she does not want anymore nitro.

## 2012-12-16 NOTE — ED Provider Notes (Signed)
History     CSN: [id number]  Arrival date & time 12/16/12  1345   First MD Initiated Contact with Patient 12/16/12 1355      Chief Complaint  Patient presents with  . Chest Pain     The history is provided by the patient.   patient resents with chest pain that began with initially with discomfort in her left arm followed by chest discomfort and a pressure sensation in her chest.  This lasted about 35-45 minutes.  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  She has no prior history of cardiac disease.  She does not have a primary care physician.  Last time she had her cholesterol blood work checked was several years ago and at that time she was found to have a cholesterol level of 290 per the daughter.  She does not smoke cigarettes.  She has a family history of early cardiac disease.  She reports some associated shortness of breath and nausea with the symptoms.  No diaphoresis.  No vomiting.  No fevers or chills.  No productive cough.  Her symptoms are mild to moderate in severity.  Symptoms seem to be improving at this time.  Past Medical History  Diagnosis Date  . Back pain     Past Surgical History  Procedure Laterality Date  . Tonsillectomy    . Cesarean section      No family history on file.  History  Substance Use Topics  . Smoking status: Never Smoker   . Smokeless tobacco: Not on file  . Alcohol Use: No    OB History   Grav Para Term Preterm Abortions TAB SAB Ect Mult Living                  Review of Systems  All other systems reviewed and are negative.    Allergies  Review of patient's allergies indicates no known allergies.  Home Medications   Current Outpatient Rx  Name  Route  Sig  Dispense  Refill  . cetirizine (ZYRTEC) 10 MG tablet   Oral   Take 10 mg by mouth as needed for allergies.         . Pseudoeph-Doxylamine-DM-APAP (NYQUIL PO)   Oral   Take 30 mLs by mouth at bedtime as needed. For cold and flu symptoms          . Soft Lens Products (CLEAR EYES CONTACT LENS RELIEF) SOLN   Right Eye   Place 1 drop into the right eye as needed (dryness).           BP 130/84  Pulse 82  Temp(Src) 99 F (37.2 C) (Oral)  Resp 16  SpO2 100%  LMP 12/02/2012  Physical Exam  Nursing note and vitals reviewed. Constitutional: She is oriented to person, place, and time. She appears well-developed and well-nourished. No distress.  HENT:  Head: Normocephalic and atraumatic.  Eyes: EOM are normal.  Neck: Normal range of motion.  Cardiovascular: Normal rate, regular rhythm and normal heart sounds.   Pulmonary/Chest: Effort normal and breath sounds normal.  Abdominal: Soft. She exhibits no distension. There is no tenderness.  Musculoskeletal: Normal range of motion.  Neurological: She is alert and oriented to person, place, and time.  Skin: Skin is warm and dry.  Psychiatric: She has a normal mood and affect. Judgment normal.    ED Course  Procedures (including critical care time)   Date: 12/16/2012  Rate: 82  Rhythm: normal  sinus rhythm  QRS Axis: normal  Intervals: normal  ST/T Wave abnormalities: normal  Conduction Disutrbances: none  Narrative Interpretation:   Old EKG Reviewed: No significant changes noted     Labs Reviewed  CBC - Abnormal; Notable for the following:    Hemoglobin 11.4 (*)    HCT 33.4 (*)    All other components within normal limits  COMPREHENSIVE METABOLIC PANEL - Abnormal; Notable for the following:    Creatinine, Ser 1.18 (*)    Total Bilirubin 0.2 (*)    GFR calc non Af Amer 54 (*)    GFR calc Af Amer 63 (*)    All other components within normal limits  POCT I-STAT TROPONIN I   Dg Chest 2 View  12/16/2012   *RADIOLOGY REPORT*  Clinical Data: Chest pressure. Shortness of breath.  CHEST - 2 VIEW  Comparison: 08/28/2012  Findings: Cardiac and mediastinal contours appear normal.  The lungs appear clear.  No pleural effusion is identified.  IMPRESSION:  No significant  abnormality identified.   Original Report Authenticated By: Gaylyn Rong, M.D.   I personally reviewed the imaging tests through PACS system I reviewed available ER/hospitalization records through the EMR   1. Chest pain       MDM  Both typical and atypical components of her chest pain.  She does not know if she has any cardiac risk factors.  Facial be admitted for additional cardiac workup and risk stratification.        Lyanne Co, MD 12/16/12 [phone]

## 2012-12-16 NOTE — ED Notes (Signed)
Pt given sandwich 

## 2012-12-17 DIAGNOSIS — R0789 Other chest pain: Secondary | ICD-10-CM | POA: Diagnosis present

## 2012-12-17 LAB — LIPID PANEL
LDL Cholesterol: 69 mg/dL (ref 0–99)
Triglycerides: 147 mg/dL (ref <150)

## 2012-12-17 LAB — BASIC METABOLIC PANEL
CO2: 20 mEq/L (ref 19–32)
Calcium: 8.6 mg/dL (ref 8.4–10.5)
GFR calc non Af Amer: 60 mL/min — ABNORMAL LOW (ref >90)
Potassium: 4.2 mEq/L (ref 3.5–5.1)
Sodium: 137 mEq/L (ref 135–145)

## 2012-12-17 LAB — RAPID URINE DRUG SCREEN, HOSP PERFORMED
Amphetamines: NOT DETECTED
Opiates: NOT DETECTED

## 2012-12-17 LAB — IRON AND TIBC
Iron: 62 ug/dL (ref 42–135)
Saturation Ratios: 17 % — ABNORMAL LOW (ref 20–55)
TIBC: 370 ug/dL (ref 250–470)

## 2012-12-17 LAB — FERRITIN: Ferritin: 22 ng/mL (ref 10–291)

## 2012-12-17 LAB — HEMOGLOBIN A1C
Hgb A1c MFr Bld: 5.2 % (ref <5.7)
Mean Plasma Glucose: 103 mg/dL (ref <117)

## 2012-12-17 LAB — TSH: TSH: 2.568 u[IU]/mL (ref 0.350–4.500)

## 2012-12-17 NOTE — Progress Notes (Signed)
UR Completed Elysse Polidore Graves-Bigelow, RN,BSN [phone]  

## 2012-12-17 NOTE — Discharge Summary (Signed)
Physician Discharge Summary  Monique Aguilar:[id number] DOB: Sep 04, 1965 DOA: 12/16/2012  PCP: No PCP Per Patient  Admit date: 12/16/2012 Discharge date: 12/17/2012  Time spent: > 30 minutes  Recommendations for Outpatient Follow-up:  1. Follow up with [place] cardiology (include homehealth, outpatient follow-up instructions, specific recommendations for PCP to follow-up on, etc.)  Discharge Diagnoses:  Active Problems:   Microcytic anemia   Renal insufficiency   Atypical chest pain   Discharge Condition: stable  Diet recommendation: regular  Filed Weights   12/16/12 1714  Weight: 98.431 kg (217 lb)    History of present illness:  See H&P for further details 47 year old female with no chronic major medical problems presents with sudden onset of dull substernal chest discomfort when she was at work today. The patient stated that it started out as some left arm pain and left finger tingling associated with the chest discomfort. She also became dizzy. There is no syncope. Vital signs are noted that the patient was tachycardic with a heart rate of 128 and blood pressure 140/90. TThe patient states that this has never occurred in the past.   Hospital Course:  Atypical Chest pain: - Cardiac markers negative x 3, EKG showed SR. - no events on telemetry. - UDS pending. - HBgA1c, TSH and FLP WNL. - Have made her an appointment with [facility] cardiology for Excersise stress test.  Procedures:  CXR  Consultations:  none  Discharge Exam: Filed Vitals:   12/16/12 1714 12/16/12 2021 12/17/12 0500 12/17/12 0808  BP: 119/82 118/69 114/71 108/70  Pulse: 72 68 67 67  Temp: 98.4 F (36.9 C) 98 F (36.7 C) 98.2 F (36.8 C)   TempSrc: Oral     Resp: 16 18 18    Height: 5\' 7"  (1.702 m)     Weight: 98.431 kg (217 lb)     SpO2: 100% 100% 96% 99%    General: A&O x3 Cardiovascular: RRR Respiratory: good air movement X3  Discharge Instructions  Discharge Orders   Future Orders  Complete By Expires     Diet - low sodium heart healthy  As directed     Increase activity slowly  As directed         Medication List    TAKE these medications       cetirizine 10 MG tablet  Commonly known as:  ZYRTEC  Take 10 mg by mouth as needed for allergies.     Clear Eyes Contact Lens Relief Soln  Place 1 drop into the right eye as needed (dryness).     NYQUIL PO  Take 30 mLs by mouth at bedtime as needed. For cold and flu symptoms       No Known Allergies     Follow-up Information   Follow up with No PCP Per Patient.   Contact information:   [address] [place] [address] [phone]       Follow up with [facility] In 1 week. (stress test)    Contact information:   [address] [place] [address]        The results of significant diagnostics from this hospitalization (including imaging, microbiology, ancillary and laboratory) are listed below for reference.    Significant Diagnostic Studies: Dg Chest 2 View  12/16/2012   *RADIOLOGY REPORT*  Clinical Data: Chest pressure. Shortness of breath.  CHEST - 2 VIEW  Comparison: 08/28/2012  Findings: Cardiac and mediastinal contours appear normal.  The lungs appear clear.  No pleural  effusion is identified.  IMPRESSION:  No significant abnormality identified.   Original Report Authenticated By: Gaylyn Rong, M.D.    Microbiology: Recent Results (from the past 240 hour(s))  MRSA PCR SCREENING     Status: None   Collection Time    12/16/12  5:35 PM      Result Value Range Status   MRSA by PCR NEGATIVE  NEGATIVE Final   Comment:            The GeneXpert MRSA Assay (FDA     approved for NASAL specimens     only), is one component of a     comprehensive MRSA colonization     surveillance program. It is not     intended to diagnose MRSA     infection nor to guide or     monitor treatment for     MRSA infections.     Labs: Basic Metabolic Panel:  Recent Labs Lab  12/16/12 1432 12/17/12 0530  NA 136 137  K 4.2 4.2  CL 102 105  CO2 25 20  GLUCOSE 90 92  BUN 14 19  CREATININE 1.18* 1.08  CALCIUM 9.8 8.6   Liver Function Tests:  Recent Labs Lab 12/16/12 1432  AST 13  ALT 11  ALKPHOS 59  BILITOT 0.2*  PROT 7.7  ALBUMIN 3.9   No results found for this basename: LIPASE, AMYLASE,  in the last 168 hours No results found for this basename: AMMONIA,  in the last 168 hours CBC:  Recent Labs Lab 12/16/12 1432  WBC 6.1  HGB 11.4*  HCT 33.4*  MCV 78.4  PLT 282   Cardiac Enzymes:  Recent Labs Lab 12/16/12 1857 12/16/12 2226  TROPONINI <0.30 <0.30   BNP: BNP (last 3 results) No results found for this basename: PROBNP,  in the last 8760 hours CBG: No results found for this basename: GLUCAP,  in the last 168 hours     Signed:  Marinda Elk  [facility] 12/17/2012, 10:57 AM

## 2012-12-17 NOTE — Care Management Note (Signed)
    Page 1 of 1   12/17/2012     11:39:37 AM   CARE MANAGEMENT NOTE 12/17/2012  Patient:  SAFINA, HUARD   Account Number:  [id number]  Date Initiated:  12/17/2012  Documentation initiated by:  GRAVES-BIGELOW,Elvyn Krohn  Subjective/Objective Assessment:   Pt admitted with cp. Has no PCP. Plan for d/c today.     Action/Plan:   CM did make a f/u appointment for PCP at the [facility] off [address]. No further needs from CM at this time.   Anticipated DC Date:  12/17/2012   Anticipated DC Plan:  HOME/SELF CARE      DC Planning Services  [facility]  Follow-up appt scheduled      Choice offered to / List presented to:             Status of service:  Completed, signed off Medicare Important Message given?   (If response is "NO", the following Medicare IM given date fields will be blank) Date Medicare IM given:   Date Additional Medicare IM given:    Discharge Disposition:  HOME/SELF CARE  Per UR Regulation:  Reviewed for med. necessity/level of care/duration of stay  If discussed at Long Length of Stay Meetings, dates discussed:    Comments:

## 2012-12-17 NOTE — Progress Notes (Signed)
Pt discharged to home per MD order. Pt received and reviewed all discharge instructions and medication information including follow-up appointments and prescription information. Pt verbalized understanding. Pt alert and oriented at discharge with no complaints of pain. Pt ambulated to private vehicle per pt request. Monique Aguilar

## 2012-12-22 ENCOUNTER — Ambulatory Visit: Payer: Self-pay | Attending: Internal Medicine | Admitting: Internal Medicine

## 2012-12-22 ENCOUNTER — Encounter: Payer: Self-pay | Admitting: Internal Medicine

## 2012-12-22 VITALS — BP 105/68 | HR 75 | Temp 98.9°F | Resp 18 | Wt 221.6 lb

## 2012-12-22 DIAGNOSIS — R079 Chest pain, unspecified: Secondary | ICD-10-CM

## 2012-12-22 MED ORDER — LORAZEPAM 1 MG PO TABS: 1.0000 mg | ORAL_TABLET | Freq: Three times a day (TID) | ORAL | Status: DC | PRN

## 2012-12-22 NOTE — Progress Notes (Signed)
Patient here for hospital follow up Was seen for chest pains Stated it was related to stress Still feels a little sluggish and dizzy at times

## 2012-12-22 NOTE — Patient Instructions (Signed)
How to Take Your Blood Pressure  These instructions are only for electronic home blood pressure machines. You will need:   An automatic or semi-automatic blood pressure machine.  Fresh batteries for the blood pressure machine. HOW DO I USE THESE TOOLS TO CHECK MY BLOOD PRESSURE?   There are 2 numbers that make up your blood pressure. For example: 120/80.  The first number (120 in our example) is called the "systolic pressure." It is a measure of the pressure in your blood vessels when your heart is pumping blood.  The second number (80 in our example) is called the "diastolic pressure." It is a measure of the pressure in your blood vessels when your heart is resting between beats.  Before you buy a home blood pressure machine, check the size of your arm so you can buy the right size cuff. Here is how to check the size of your arm:  Use a tape measure that shows both inches and centimeters.  Wrap the tape measure around the middle upper part of your arm. You may need someone to help you measure right.  Write down your arm measurement in both inches and centimeters.  To measure your blood pressure right, it is important to have the right size cuff.  If your arm is up to 13 inches (37 to 34 centimeters), get an adult cuff size.  If your arm is 13 to 17 inches (35 to 44 centimeters), get a large adult cuff size.  If your arm is 17 to 20 inches (45 to 52 centimeters), get an adult thigh cuff.  Try to rest or relax for at least 30 minutes before you check your blood pressure.  Do not smoke.  Do not have any drinks with caffeine, such as:  Pop.  Coffee.  Tea.  Check your blood pressure in a quiet room.  Sit down and stretch out your arm on a table. Keep your arm at about the level of your heart. Let your arm relax. GETTING BLOOD PRESSURE READINGS  Make sure you remove any tight-fighting clothing from your arm. Wrap the cuff around your upper arm. Wrap it just above the bend,  and above where you felt the pulse. You should be able to slip a finger between the cuff and your arm. If you cannot slip a finger in the cuff, it is too tight and should be removed and rewrapped.  Some units requires you to manually pump up the arm cuff.  Automatic units inflate the cuff when you press a button.  Cuff deflation is automatic in both models.  After the cuff is inflated, the unit measures your blood pressure and pulse. The readings are displayed on a monitor. Hold still and breathe normally while the cuff is inflated.  Getting a reading takes less than a minute.  Some models store readings in a memory. Some provide a printout of readings.  Get readings at different times of the day. You should wait at least 5 minutes between readings. Take readings with you to your next doctor's visit. Document Released: 07/03/2008 Document Revised: 10/13/2011 Document Reviewed: 07/03/2008 [employer] Patient Information 2014 [employer].  

## 2012-12-22 NOTE — Progress Notes (Signed)
Patient ID: Monique Aguilar, female   DOB: 26-Dec-1965, 47 y.o.   MRN: [id number]  CC: hospital follow up  HPI: Pt is 47 yo female who presents to clinic for follow up on her recent hospitalization. She was admitted for evaluation of chest pain and ACS was ruled out. She was sent home and advised to follow up. She denies chest pain today, no chest pain since discharge, no shortness of breath, no other concerns. Pt explains she has no problems with blood pressure.   No Known Allergies Past Medical History  Diagnosis Date  . Myocardial infarction     "today" (12/16/2012)  . Microcytic anemia 12/16/2012  . Migraines     "maybe monthly" (12/16/2012)  . Chronic lower back pain     "probably have it twice/wk" (12/16/2012)   Current Outpatient Prescriptions on File Prior to Visit  Medication Sig Dispense Refill  . cetirizine (ZYRTEC) 10 MG tablet Take 10 mg by mouth as needed for allergies.      . Pseudoeph-Doxylamine-DM-APAP (NYQUIL PO) Take 30 mLs by mouth at bedtime as needed. For cold and flu symptoms      . Soft Lens Products (CLEAR EYES CONTACT LENS RELIEF) SOLN Place 1 drop into the right eye as needed (dryness).       No current facility-administered medications on file prior to visit.   No family history on file. History   Social History  . Marital Status: Divorced    Spouse Name: N/A    Number of Children: N/A  . Years of Education: N/A   Occupational History  . Not on file.   Social History Main Topics  . Smoking status: Never Smoker   . Smokeless tobacco: Never Used  . Alcohol Use: No  . Drug Use: No  . Sexually Active: Not Currently    Birth Control/ Protection: Surgical   Other Topics Concern  . Not on file   Social History Narrative  . No narrative on file    Review of Systems  Constitutional: Negative for fever, chills, diaphoresis, activity change, appetite change and fatigue.  HENT: Negative for ear pain, nosebleeds, congestion, facial swelling, rhinorrhea,  neck pain, neck stiffness and ear discharge.   Eyes: Negative for pain, discharge, redness, itching and visual disturbance.  Respiratory: Negative for cough, choking, chest tightness, shortness of breath, wheezing and stridor.   Cardiovascular: Negative for chest pain, palpitations and leg swelling.  Gastrointestinal: Negative for abdominal distention.  Genitourinary: Negative for dysuria, urgency, frequency, hematuria, flank pain, decreased urine volume, difficulty urinating and dyspareunia.  Musculoskeletal: Negative for back pain, joint swelling, arthralgias and gait problem.  Neurological: Negative for dizziness, tremors, seizures, syncope, facial asymmetry, speech difficulty, weakness, light-headedness, numbness and headaches.  Hematological: Negative for adenopathy. Does not bruise/bleed easily.  Psychiatric/Behavioral: Negative for hallucinations, behavioral problems, confusion, dysphoric mood, decreased concentration and agitation.    Objective:   Filed Vitals:   12/22/12 1059  BP: 105/68  Pulse: 75  Temp: 98.9 F (37.2 C)  Resp: 18    Physical Exam  Constitutional: Appears well-developed and well-nourished. No distress.  CVS: RRR, S1/S2 +, no murmurs, no gallops, no carotid bruit.  Pulmonary: Effort and breath sounds normal, no stridor, rhonchi, wheezes, rales.  Abdominal: Soft. BS +,  no distension, tenderness, rebound or guarding.  Musculoskeletal: Normal range of motion. No edema and no tenderness.  Neuro: Alert. Normal reflexes, muscle tone coordination. No cranial nerve deficit. Psychiatric: Normal mood and affect. Behavior, judgment, thought content normal.  Lab Results  Component Value Date   WBC 6.1 12/16/2012   HGB 11.4* 12/16/2012   HCT 33.4* 12/16/2012   MCV 78.4 12/16/2012   PLT 282 12/16/2012   Lab Results  Component Value Date   CREATININE 1.08 12/17/2012   BUN 19 12/17/2012   NA 137 12/17/2012   K 4.2 12/17/2012   CL 105 12/17/2012   CO2 20 12/17/2012     Lab Results  Component Value Date   HGBA1C 5.2 12/16/2012        Assessment and Plan:   Patient Active Problem List   Diagnosis Date Noted  . Atypical chest pain - no chest pain today, we have discussed symptoms that would necessitate emergency visit to ED, also advised BP checks regularly 12/17/2012  . Microcytic anemia - stable, no need for CBC check today  12/16/2012  . Renal insufficiency - blood work checked recently and stable creatinine, no nneed for repeat BMP today  12/16/2012

## 2013-07-18 ENCOUNTER — Encounter (HOSPITAL_COMMUNITY): Payer: Self-pay | Admitting: Emergency Medicine

## 2013-07-18 ENCOUNTER — Emergency Department (HOSPITAL_COMMUNITY)
Admission: EM | Admit: 2013-07-18 | Discharge: 2013-07-18 | Disposition: A | Discharge status: Home / Self Care | Payer: Self-pay | Attending: Emergency Medicine | Admitting: Emergency Medicine

## 2013-07-18 DIAGNOSIS — G8929 Other chronic pain: Secondary | ICD-10-CM | POA: Insufficient documentation

## 2013-07-18 DIAGNOSIS — M25559 Pain in unspecified hip: Secondary | ICD-10-CM | POA: Insufficient documentation

## 2013-07-18 DIAGNOSIS — M79609 Pain in unspecified limb: Secondary | ICD-10-CM | POA: Insufficient documentation

## 2013-07-18 DIAGNOSIS — M545 Low back pain, unspecified: Secondary | ICD-10-CM | POA: Insufficient documentation

## 2013-07-18 DIAGNOSIS — R209 Unspecified disturbances of skin sensation: Secondary | ICD-10-CM | POA: Insufficient documentation

## 2013-07-18 DIAGNOSIS — Z8679 Personal history of other diseases of the circulatory system: Secondary | ICD-10-CM | POA: Insufficient documentation

## 2013-07-18 DIAGNOSIS — Z862 Personal history of diseases of the blood and blood-forming organs and certain disorders involving the immune mechanism: Secondary | ICD-10-CM | POA: Insufficient documentation

## 2013-07-18 DIAGNOSIS — I252 Old myocardial infarction: Secondary | ICD-10-CM | POA: Insufficient documentation

## 2013-07-18 MED ORDER — NAPROXEN 500 MG PO TABS: 500.0000 mg | ORAL_TABLET | Freq: Two times a day (BID) | ORAL | Status: DC

## 2013-07-18 MED ORDER — METHOCARBAMOL 500 MG PO TABS: 500.0000 mg | ORAL_TABLET | Freq: Two times a day (BID) | ORAL | Status: DC

## 2013-07-18 MED ORDER — HYDROCODONE-ACETAMINOPHEN 5-325 MG PO TABS: 1.0000 | ORAL_TABLET | Freq: Four times a day (QID) | ORAL | Status: DC | PRN

## 2013-07-18 NOTE — ED Provider Notes (Signed)
CSN: [id number]     Arrival date & time 07/18/13  0703 History   First MD Initiated Contact with Patient 07/18/13 [phone]     Chief Complaint  Patient presents with  . Back Pain  . Hip Pain  . Leg Pain   (Consider location/radiation/quality/duration/timing/severity/associated sxs/prior Treatment) HPI Comments: Patient presents with a chief complaint of lower back pain.  She reports that the pain radiates down the right leg.  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  She has had similar pain intermittently over the past several years.  She denies any acute injury or trauma.  She reports that the pain is associated with some numbness of the right leg.  She reports that the pain is worse with movement and ambulating.  She has used heat for her pain and also taken Goody's, but does not feel that it is helping.  She denies bowel or bladder incontinence.  Denies urinary symptoms.  Denies saddle anaesthesia.  Denies fever or chills.  She denies any history of IV drug use or Cancer.  Patient is a 47 y.o. female presenting with back pain. The history is provided by the patient.  Back Pain Location:  Lumbar spine   Past Medical History  Diagnosis Date  . Myocardial infarction     "today" (12/16/2012)  . Microcytic anemia 12/16/2012  . Migraines     "maybe monthly" (12/16/2012)  . Chronic lower back pain     "probably have it twice/wk" (12/16/2012)   Past Surgical History  Procedure Laterality Date  . Tonsillectomy  1992?  [name] Cesarean section  1988  . Tubal ligation  1995   No family history on file. History  Substance Use Topics  . Smoking status: Never Smoker   . Smokeless tobacco: Never Used  . Alcohol Use: No   OB History   Grav Para Term Preterm Abortions TAB SAB Ect Mult Living                 Review of Systems  Musculoskeletal: Positive for back pain.  All other systems reviewed and are negative.    Allergies  Review of  patient's allergies indicates no known allergies.  Home Medications   Current Outpatient Rx  Name  Route  Sig  Dispense  Refill  . cetirizine (ZYRTEC) 10 MG tablet   Oral   Take 10 mg by mouth as needed for allergies.         [name] LORazepam (ATIVAN) 1 MG tablet   Oral   Take 1 tablet (1 mg total) by mouth every 8 (eight) hours as needed for anxiety.   90 tablet   3   . Pseudoeph-Doxylamine-DM-APAP (NYQUIL PO)   Oral   Take 30 mLs by mouth at bedtime as needed. For cold and flu symptoms         . Soft Lens Products (CLEAR EYES CONTACT LENS RELIEF) SOLN   Right Eye   Place 1 drop into the right eye as needed (dryness).          BP 126/90  Pulse 92  Temp(Src) 98.2 F (36.8 C) (Oral)  Resp 20  Ht 5\' 7"  (1.702 m)  Wt 178 lb (80.74 kg)  BMI 27.87 kg/m2  SpO2 100%  LMP 07/04/2013 Physical Exam  Nursing note and vitals reviewed. Constitutional: She appears well-developed and well-nourished.  HENT:  Head: Normocephalic and atraumatic.  Mouth/Throat: Oropharynx is clear and moist.  Neck: Normal range of motion. Neck supple.  Cardiovascular: Normal rate, regular rhythm and normal heart sounds.   Pulmonary/Chest: Effort normal and breath sounds normal.  Musculoskeletal: Normal range of motion.       Cervical back: She exhibits normal range of motion, no tenderness, no bony tenderness, no swelling, no edema and no deformity.       Thoracic back: She exhibits normal range of motion, no tenderness, no bony tenderness, no swelling, no edema and no deformity.       Lumbar back: She exhibits tenderness and bony tenderness. She exhibits normal range of motion, no swelling, no edema and no deformity.  Neurological: She is alert. She has normal strength. No sensory deficit. Gait normal.  Reflex Scores:      Patellar reflexes are 2+ on the right side and 2+ on the left side.      Achilles reflexes are 2+ on the right side and 2+ on the left side. Skin: Skin is warm and dry. No  erythema.  Psychiatric: She has a normal mood and affect.    ED Course  Procedures (including critical care time) Labs Review Labs Reviewed - No data to display Imaging Review No results found.  EKG Interpretation   None       MDM  No diagnosis found. Patient with back pain.  No neurological deficits and normal neuro exam.  Patient can walk but states is painful.  No loss of bowel or bladder control.  No concern for cauda equina.  No fever, night sweats, weight loss, h/o cancer, IVDU.  RICE protocol and pain medicine indicated and discussed with patient. Patient stable for discharge.  Return precautions given.     Santiago Glad, PA-C 07/18/13 [phone]

## 2013-07-18 NOTE — ED Notes (Signed)
Pt c/o low back pain that radiates down right hip and right leg onset Thursday Morning. Pt works as a Lawyer. Pt able to ambulate but with pain. Pt denies incontinence of bowel or bladder.

## 2013-07-19 NOTE — ED Provider Notes (Signed)
Medical screening examination/treatment/procedure(s) were performed by non-physician practitioner and as supervising physician I was immediately available for consultation/collaboration.  EKG Interpretation   None         Suzi Roots, MD 07/19/13 [phone]

## 2013-07-22 ENCOUNTER — Emergency Department (HOSPITAL_COMMUNITY): Payer: Self-pay

## 2013-07-22 ENCOUNTER — Emergency Department (HOSPITAL_COMMUNITY)
Admission: EM | Admit: 2013-07-22 | Discharge: 2013-07-22 | Disposition: A | Discharge status: Home / Self Care | Payer: Self-pay | Attending: Emergency Medicine | Admitting: Emergency Medicine

## 2013-07-22 ENCOUNTER — Encounter (HOSPITAL_COMMUNITY): Payer: Self-pay | Admitting: Emergency Medicine

## 2013-07-22 DIAGNOSIS — G8929 Other chronic pain: Secondary | ICD-10-CM | POA: Insufficient documentation

## 2013-07-22 DIAGNOSIS — R609 Edema, unspecified: Secondary | ICD-10-CM | POA: Insufficient documentation

## 2013-07-22 DIAGNOSIS — R0600 Dyspnea, unspecified: Secondary | ICD-10-CM

## 2013-07-22 DIAGNOSIS — R0989 Other specified symptoms and signs involving the circulatory and respiratory systems: Secondary | ICD-10-CM | POA: Insufficient documentation

## 2013-07-22 DIAGNOSIS — R0602 Shortness of breath: Secondary | ICD-10-CM | POA: Insufficient documentation

## 2013-07-22 DIAGNOSIS — Z791 Long term (current) use of non-steroidal anti-inflammatories (NSAID): Secondary | ICD-10-CM | POA: Insufficient documentation

## 2013-07-22 DIAGNOSIS — Z79899 Other long term (current) drug therapy: Secondary | ICD-10-CM | POA: Insufficient documentation

## 2013-07-22 DIAGNOSIS — I252 Old myocardial infarction: Secondary | ICD-10-CM | POA: Insufficient documentation

## 2013-07-22 DIAGNOSIS — R0609 Other forms of dyspnea: Secondary | ICD-10-CM | POA: Insufficient documentation

## 2013-07-22 DIAGNOSIS — Z862 Personal history of diseases of the blood and blood-forming organs and certain disorders involving the immune mechanism: Secondary | ICD-10-CM | POA: Insufficient documentation

## 2013-07-22 LAB — CBC
HCT: 34.8 % — ABNORMAL LOW (ref 36.0–46.0)
Hemoglobin: 11.6 g/dL — ABNORMAL LOW (ref 12.0–15.0)
MCH: 27 pg (ref 26.0–34.0)
MCHC: 33.3 g/dL (ref 30.0–36.0)
MCV: 80.9 fL (ref 78.0–100.0)
RBC: 4.3 MIL/uL (ref 3.87–5.11)
WBC: 4.7 10*3/uL (ref 4.0–10.5)

## 2013-07-22 LAB — BASIC METABOLIC PANEL
CO2: 23 mEq/L (ref 19–32)
Calcium: 9.2 mg/dL (ref 8.4–10.5)
Chloride: 103 mEq/L (ref 96–112)
Creatinine, Ser: 1.07 mg/dL (ref 0.50–1.10)
GFR calc non Af Amer: 60 mL/min — ABNORMAL LOW (ref >90)
Glucose, Bld: 81 mg/dL (ref 70–99)
Sodium: 137 mEq/L (ref 135–145)

## 2013-07-22 LAB — URINE MICROSCOPIC-ADD ON

## 2013-07-22 LAB — URINALYSIS, ROUTINE W REFLEX MICROSCOPIC
Glucose, UA: NEGATIVE mg/dL
Leukocytes, UA: NEGATIVE
Protein, ur: NEGATIVE mg/dL
Specific Gravity, Urine: 1.027 (ref 1.005–1.030)
Urobilinogen, UA: 1 mg/dL (ref 0.0–1.0)
pH: 6 (ref 5.0–8.0)

## 2013-07-22 LAB — POCT I-STAT TROPONIN I: Troponin i, poc: 0 ng/mL (ref 0.00–0.08)

## 2013-07-22 NOTE — ED Notes (Signed)
Patient transported to X-ray 

## 2013-07-22 NOTE — ED Notes (Signed)
Pt returned from xray. Placed back on 5 lead VSS.

## 2013-07-22 NOTE — ED Notes (Signed)
Pt states "im retaining fluid." c/o facial and leg edema for past 2 days. States she has not urinated in past 24 hours and does not feel like she needs to urinate. Denies pain. Reports dyspnea on exertion

## 2013-07-22 NOTE — ED Provider Notes (Signed)
CSN: [id number]     Arrival date & time 07/22/13  1112 History   First MD Initiated Contact with Patient 07/22/13 1218     Chief Complaint  Patient presents with  . Facial Swelling   (Consider location/radiation/quality/duration/timing/severity/associated sxs/prior Treatment) The history is provided by the patient.   patient the last. Worse with walking. States she's not been able to walk as far she normally does. No chest pain. No fevers. No cough. She states her face feels swollen and her legs also feel swollen. States she had a history of a heart attack when she had atrial fibrillation. She's not currently on Coumadin for anticoagulation. No recent travel. No exogenous hormones Past Medical History  Diagnosis Date  . Myocardial infarction     "today" (12/16/2012)  . Microcytic anemia 12/16/2012  . Migraines     "maybe monthly" (12/16/2012)  . Chronic lower back pain     "probably have it twice/wk" (12/16/2012)   Past Surgical History  Procedure Laterality Date  . Tonsillectomy  1992?  [name] Cesarean section  1988  . Tubal ligation  1995   History reviewed. No pertinent family history. History  Substance Use Topics  . Smoking status: Never Smoker   . Smokeless tobacco: Never Used  . Alcohol Use: No   OB History   Grav Para Term Preterm Abortions TAB SAB Ect Mult Living                 Review of Systems  Constitutional: Negative for activity change, appetite change and unexpected weight change.  Eyes: Negative for pain.  Respiratory: Positive for shortness of breath. Negative for chest tightness.   Cardiovascular: Positive for leg swelling. Negative for chest pain.  Gastrointestinal: Negative for nausea, vomiting, abdominal pain and diarrhea.  Genitourinary: Negative for flank pain.  Musculoskeletal: Negative for back pain and neck stiffness.  Skin: Negative for rash.  Neurological: Negative for weakness, numbness and headaches.  Psychiatric/Behavioral: Negative for  behavioral problems.    Allergies  Review of patient's allergies indicates no known allergies.  Home Medications   Current Outpatient Rx  Name  Route  Sig  Dispense  Refill  . cetirizine (ZYRTEC) 10 MG tablet   Oral   Take 10 mg by mouth as needed for allergies.         [name] HYDROcodone-acetaminophen (NORCO/VICODIN) 5-325 MG per tablet   Oral   Take 1-2 tablets by mouth every 6 (six) hours as needed for moderate pain.         [name] LORazepam (ATIVAN) 1 MG tablet   Oral   Take 1 tablet (1 mg total) by mouth every 8 (eight) hours as needed for anxiety.   90 tablet   3   . methocarbamol (ROBAXIN) 500 MG tablet   Oral   Take 1 tablet (500 mg total) by mouth 2 (two) times daily.   20 tablet   0   . naproxen (NAPROSYN) 500 MG tablet   Oral   Take 1 tablet (500 mg total) by mouth 2 (two) times daily.   30 tablet   0    BP 108/61  Pulse 73  Temp(Src) 98.3 F (36.8 C) (Oral)  Resp 23  Ht 5\' 7"  (1.702 m)  Wt 225 lb 1.6 oz (102.105 kg)  BMI 35.25 kg/m2  SpO2 100%  LMP 07/04/2013 Physical Exam  Nursing note and vitals reviewed. Constitutional: She is oriented to person, place, and time. She appears well-developed and well-nourished.  HENT:  Head: Normocephalic  and atraumatic.  Eyes: EOM are normal. Pupils are equal, round, and reactive to light.  Neck: Normal range of motion. Neck supple.  Cardiovascular: Normal rate, regular rhythm and normal heart sounds.   No murmur heard. Pulmonary/Chest: Effort normal and breath sounds normal. No respiratory distress. She has no wheezes. She has no rales.  Abdominal: Soft. Bowel sounds are normal. She exhibits no distension. There is no tenderness. There is no rebound and no guarding.  Musculoskeletal: Normal range of motion. She exhibits edema.  Trace bilateral lower extremity pitting edema.  Neurological: She is alert and oriented to person, place, and time. No cranial nerve deficit.  Skin: Skin is warm and dry.  Psychiatric: She  has a normal mood and affect. Her speech is normal.    ED Course  Procedures (including critical care time) Labs Review Labs Reviewed  CBC - Abnormal; Notable for the following:    Hemoglobin 11.6 (*)    HCT 34.8 (*)    All other components within normal limits  BASIC METABOLIC PANEL - Abnormal; Notable for the following:    GFR calc non Af Amer 60 (*)    GFR calc Af Amer 70 (*)    All other components within normal limits  URINALYSIS, ROUTINE W REFLEX MICROSCOPIC - Abnormal; Notable for the following:    APPearance CLOUDY (*)    Hgb urine dipstick TRACE (*)    All other components within normal limits  PRO B NATRIURETIC PEPTIDE  URINE MICROSCOPIC-ADD ON  POCT I-STAT TROPONIN I   Imaging Review Dg Chest 2 View  07/22/2013   CLINICAL DATA:  Chest pain and shortness of breath  EXAM: CHEST  2 VIEW  COMPARISON:  None.  FINDINGS: The heart size and mediastinal contours are within normal limits. Both lungs are clear. The visualized skeletal structures are unremarkable.  IMPRESSION: No active cardiopulmonary disease.   Electronically Signed   By: Alcide Clever M.D.   On: 07/22/2013 13:02    EKG Interpretation    Date/Time:  Friday July 22 2013 11:20:45 EST Ventricular Rate:  91 PR Interval:  160 QRS Duration: 74 QT Interval:  362 QTC Calculation: 445 R Axis:   53 Text Interpretation:  Normal sinus rhythm Normal ECG Confirmed by Slyvester Latona  MD, Neeko Pharo ([id number]) on 07/22/2013 12:48:21 PM            MDM   1. Dyspnea    Patient with dyspnea. Doubt pulmonary embolism. EKG and lab work reassuring. Does not appear to be in CHF. She was not hypoxic with ambulation. Will discharge and will followup with her primary care Dr.    Juliet Rude. Rubin Payor, MD 07/22/13 1606

## 2013-07-22 NOTE — ED Notes (Signed)
Patient did great walking stayed at 100 room air the whole time

## 2013-08-09 ENCOUNTER — Telehealth: Payer: Self-pay

## 2013-08-09 ENCOUNTER — Ambulatory Visit: Payer: 59 | Attending: Internal Medicine | Admitting: Internal Medicine

## 2013-08-09 ENCOUNTER — Encounter: Payer: Self-pay | Admitting: Internal Medicine

## 2013-08-09 VITALS — BP 124/83 | HR 76 | Temp 98.1°F | Resp 17 | Wt 225.8 lb

## 2013-08-09 DIAGNOSIS — R059 Cough, unspecified: Secondary | ICD-10-CM

## 2013-08-09 DIAGNOSIS — R05 Cough: Secondary | ICD-10-CM

## 2013-08-09 DIAGNOSIS — J069 Acute upper respiratory infection, unspecified: Secondary | ICD-10-CM

## 2013-08-09 DIAGNOSIS — R0981 Nasal congestion: Secondary | ICD-10-CM

## 2013-08-09 DIAGNOSIS — R112 Nausea with vomiting, unspecified: Secondary | ICD-10-CM

## 2013-08-09 DIAGNOSIS — J3489 Other specified disorders of nose and nasal sinuses: Secondary | ICD-10-CM

## 2013-08-09 MED ORDER — BENZONATATE 100 MG PO CAPS: 100.0000 mg | ORAL_CAPSULE | Freq: Three times a day (TID) | ORAL | Status: DC | PRN

## 2013-08-09 MED ORDER — AZITHROMYCIN 250 MG PO TABS: ORAL_TABLET | ORAL | Status: DC

## 2013-08-09 MED ORDER — FLUTICASONE PROPIONATE 50 MCG/ACT NA SUSP: 2.0000 | Freq: Every day | NASAL | Status: DC

## 2013-08-09 MED ORDER — METOCLOPRAMIDE HCL 10 MG PO TABS: 10.0000 mg | ORAL_TABLET | Freq: Three times a day (TID) | ORAL | Status: DC | PRN

## 2013-08-09 NOTE — Progress Notes (Signed)
Patient states has been vomiting since yesterday Having body aches and  Having fever past day Not able to keep food down

## 2013-08-09 NOTE — Progress Notes (Signed)
MRN: [id number] Name: Monique Aguilar  Sex: female Age: 48 y.o. DOB: 05/21/1966  Allergies: Review of patient's allergies indicates no known allergies.  Chief Complaint  Patient presents with  . Emesis    HPI: Patient is 48 y.o. female who referred her to have URI symptoms of nasal congestion postnasal drip myalgia fatigue some productive cough nausea vomiting since yesterday, patient is trying to bring general reports nausea today denies any vomiting, had vomited 3 times yesterday denies any abdominal pain change in bowel habits.  Past Medical History  Diagnosis Date  . Myocardial infarction     "today" (12/16/2012)  . Microcytic anemia 12/16/2012  . Migraines     "maybe monthly" (12/16/2012)  . Chronic lower back pain     "probably have it twice/wk" (12/16/2012)    Past Surgical History  Procedure Laterality Date  . Tonsillectomy  1992?  [name] Cesarean section  1988  . Tubal ligation  1995      Medication List       This list is accurate as of: 08/09/13  5:18 PM.  Always use your most recent med list.               azithromycin 250 MG tablet  Commonly known as:  ZITHROMAX Z-PAK  Take as directed     benzonatate 100 MG capsule  Commonly known as:  TESSALON  Take 1 capsule (100 mg total) by mouth 3 (three) times daily as needed for cough.     cetirizine 10 MG tablet  Commonly known as:  ZYRTEC  Take 10 mg by mouth as needed for allergies.     fluticasone 50 MCG/ACT nasal spray  Commonly known as:  FLONASE  Place 2 sprays into both nostrils daily.     HYDROcodone-acetaminophen 5-325 MG per tablet  Commonly known as:  NORCO/VICODIN  Take 1-2 tablets by mouth every 6 (six) hours as needed for moderate pain.     LORazepam 1 MG tablet  Commonly known as:  ATIVAN  Take 1 tablet (1 mg total) by mouth every 8 (eight) hours as needed for anxiety.     methocarbamol 500 MG tablet  Commonly known as:  ROBAXIN  Take 1 tablet (500 mg total) by mouth 2 (two) times daily.      metoCLOPramide 10 MG tablet  Commonly known as:  REGLAN  Take 1 tablet (10 mg total) by mouth every 8 (eight) hours as needed for nausea or vomiting.     naproxen 500 MG tablet  Commonly known as:  NAPROSYN  Take 1 tablet (500 mg total) by mouth 2 (two) times daily.        Meds ordered this encounter  Medications  . metoCLOPramide (REGLAN) 10 MG tablet    Sig: Take 1 tablet (10 mg total) by mouth every 8 (eight) hours as needed for nausea or vomiting.    Dispense:  60 tablet    Refill:  1  . DISCONTD: azithromycin (ZITHROMAX Z-PAK) 250 MG tablet    Sig: Take as directed    Dispense:  6 each    Refill:  0  . benzonatate (TESSALON) 100 MG capsule    Sig: Take 1 capsule (100 mg total) by mouth 3 (three) times daily as needed for cough.    Dispense:  30 capsule    Refill:  1  . fluticasone (FLONASE) 50 MCG/ACT nasal spray    Sig: Place 2 sprays into both nostrils daily.    Dispense:  16 g  Refill:  1  . azithromycin (ZITHROMAX Z-PAK) 250 MG tablet    Sig: Take as directed    Dispense:  6 each    Refill:  0    Immunization History  Administered Date(s) Administered  . Tdap 11/21/2011    History reviewed. No pertinent family history.  History  Substance Use Topics  . Smoking status: Never Smoker   . Smokeless tobacco: Never Used  . Alcohol Use: No    Review of Systems  As noted in HPI  Filed Vitals:   08/09/13 1612  BP: 124/83  Pulse: 76  Temp: 98.1 F (36.7 C)  Resp: 17    Physical Exam  Physical Exam  HENT:  Nasal congestion, minimal sinus tenderness, some pharyngeal erythema no exudate   Eyes: EOM are normal. Pupils are equal, round, and reactive to light.  Pulmonary/Chest: No respiratory distress. She has no wheezes. She has no rales.  Abdominal: Soft. There is no tenderness. There is no rebound.    CBC    Component Value Date/Time   WBC 4.7 07/22/2013 1215   RBC 4.30 07/22/2013 1215   HGB 11.6* 07/22/2013 1215   HCT 34.8* 07/22/2013  1215   PLT 292 07/22/2013 1215   MCV 80.9 07/22/2013 1215   LYMPHSABS 2.8 08/28/2012 1855   MONOABS 0.4 08/28/2012 1855   EOSABS 0.1 08/28/2012 1855   BASOSABS 0.0 08/28/2012 1855    CMP     Component Value Date/Time   NA 137 07/22/2013 1215   K 4.0 07/22/2013 1215   CL 103 07/22/2013 1215   CO2 23 07/22/2013 1215   GLUCOSE 81 07/22/2013 1215   BUN 13 07/22/2013 1215   CREATININE 1.07 07/22/2013 1215   CALCIUM 9.2 07/22/2013 1215   PROT 7.7 12/16/2012 1432   ALBUMIN 3.9 12/16/2012 1432   AST 13 12/16/2012 1432   ALT 11 12/16/2012 1432   ALKPHOS 59 12/16/2012 1432   BILITOT 0.2* 12/16/2012 1432   GFRNONAA 60* 07/22/2013 1215   GFRAA 70* 07/22/2013 1215    Lab Results  Component Value Date/Time   CHOL 131 12/17/2012  5:30 AM    No components found with this basename: hga1c    Lab Results  Component Value Date/Time   AST 13 12/16/2012  2:32 PM    Assessment and Plan  URI (upper respiratory infection) - Plan: azithromycin (ZITHROMAX Z-PAK) 250 MG tablet, DISCONTINUED: azithromycin (ZITHROMAX Z-PAK) 250 MG tablet  Stuffy nose - Plan: fluticasone (FLONASE) 50 MCG/ACT nasal spray  Nausea & vomiting - Plan: metoCLOPramide (REGLAN) 10 MG tablet  Cough - Plan: benzonatate (TESSALON) 100 MG capsule  Return if symptoms worsen or fail to improve.  Doris CheadleADVANI, Monique Catanese, MD

## 2013-09-05 ENCOUNTER — Encounter (HOSPITAL_COMMUNITY): Payer: Self-pay | Admitting: Emergency Medicine

## 2013-09-05 ENCOUNTER — Emergency Department (HOSPITAL_COMMUNITY): Payer: 59

## 2013-09-05 ENCOUNTER — Emergency Department (HOSPITAL_COMMUNITY)
Admission: EM | Admit: 2013-09-05 | Discharge: 2013-09-05 | Disposition: A | Discharge status: Home / Self Care | Payer: 59 | Attending: Emergency Medicine | Admitting: Emergency Medicine

## 2013-09-05 DIAGNOSIS — Z862 Personal history of diseases of the blood and blood-forming organs and certain disorders involving the immune mechanism: Secondary | ICD-10-CM | POA: Insufficient documentation

## 2013-09-05 DIAGNOSIS — R110 Nausea: Secondary | ICD-10-CM | POA: Insufficient documentation

## 2013-09-05 DIAGNOSIS — IMO0002 Reserved for concepts with insufficient information to code with codable children: Secondary | ICD-10-CM | POA: Insufficient documentation

## 2013-09-05 DIAGNOSIS — Z791 Long term (current) use of non-steroidal anti-inflammatories (NSAID): Secondary | ICD-10-CM | POA: Insufficient documentation

## 2013-09-05 DIAGNOSIS — R42 Dizziness and giddiness: Secondary | ICD-10-CM | POA: Insufficient documentation

## 2013-09-05 DIAGNOSIS — I252 Old myocardial infarction: Secondary | ICD-10-CM | POA: Insufficient documentation

## 2013-09-05 DIAGNOSIS — G8929 Other chronic pain: Secondary | ICD-10-CM | POA: Insufficient documentation

## 2013-09-05 DIAGNOSIS — R0789 Other chest pain: Secondary | ICD-10-CM | POA: Insufficient documentation

## 2013-09-05 DIAGNOSIS — G43909 Migraine, unspecified, not intractable, without status migrainosus: Secondary | ICD-10-CM | POA: Insufficient documentation

## 2013-09-05 DIAGNOSIS — Z792 Long term (current) use of antibiotics: Secondary | ICD-10-CM | POA: Insufficient documentation

## 2013-09-05 DIAGNOSIS — R0602 Shortness of breath: Secondary | ICD-10-CM | POA: Insufficient documentation

## 2013-09-05 LAB — D-DIMER, QUANTITATIVE: D-Dimer, Quant: 0.39 ug/mL-FEU (ref 0.00–0.48)

## 2013-09-05 LAB — TROPONIN I
Troponin I: <0.3 ng/mL (ref <0.30)
Troponin I: <0.3 ng/mL (ref <0.30)

## 2013-09-05 LAB — BASIC METABOLIC PANEL
BUN: 9 mg/dL (ref 6–23)
CHLORIDE: 103 meq/L (ref 96–112)
CO2: 20 mEq/L (ref 19–32)
Calcium: 9.1 mg/dL (ref 8.4–10.5)
Creatinine, Ser: 1.06 mg/dL (ref 0.50–1.10)
GFR calc Af Amer: 71 mL/min — ABNORMAL LOW (ref >90)
GFR calc non Af Amer: 61 mL/min — ABNORMAL LOW (ref >90)
GLUCOSE: 90 mg/dL (ref 70–99)
POTASSIUM: 4 meq/L (ref 3.7–5.3)
Sodium: 138 mEq/L (ref 137–147)

## 2013-09-05 LAB — CBC
HEMATOCRIT: 34.9 % — AB (ref 36.0–46.0)
HEMOGLOBIN: 11.8 g/dL — AB (ref 12.0–15.0)
MCH: 27.3 pg (ref 26.0–34.0)
MCHC: 33.8 g/dL (ref 30.0–36.0)
MCV: 80.6 fL (ref 78.0–100.0)
Platelets: 314 10*3/uL (ref 150–400)
RBC: 4.33 MIL/uL (ref 3.87–5.11)
RDW: 14 % (ref 11.5–15.5)
WBC: 4.9 10*3/uL (ref 4.0–10.5)

## 2013-09-05 MED ORDER — KETOROLAC TROMETHAMINE 15 MG/ML IJ SOLN
15.0000 mg | Freq: Once | INTRAMUSCULAR | Status: DC
  Filled 2013-09-05: qty 1

## 2013-09-05 MED ORDER — MORPHINE SULFATE 4 MG/ML IJ SOLN
4.0000 mg | Freq: Once | INTRAMUSCULAR | Status: AC
  Administered 2013-09-05: 4 mg via INTRAVENOUS
  Filled 2013-09-05: qty 1

## 2013-09-05 MED ORDER — ACETAMINOPHEN 500 MG PO TABS
1000.0000 mg | ORAL_TABLET | Freq: Once | ORAL | Status: AC
  Administered 2013-09-05: 1000 mg via ORAL
  Filled 2013-09-05: qty 2

## 2013-09-05 MED ORDER — KETOROLAC TROMETHAMINE 15 MG/ML IJ SOLN
15.0000 mg | Freq: Once | INTRAMUSCULAR | Status: AC
  Administered 2013-09-05: 15 mg via INTRAVENOUS
  Filled 2013-09-05: qty 1

## 2013-09-05 MED ORDER — ONDANSETRON HCL 4 MG/2ML IJ SOLN
4.0000 mg | Freq: Once | INTRAMUSCULAR | Status: AC
  Administered 2013-09-05: 4 mg via INTRAVENOUS
  Filled 2013-09-05: qty 2

## 2013-09-05 NOTE — ED Provider Notes (Signed)
CSN: [id number]     Arrival date & time 09/05/13  4098 History   First MD Initiated Contact with Patient 09/05/13 [phone]     Chief Complaint  Patient presents with  . Chest Pain    HPI: Monique Aguilar is a 48 yo F with history of anemia and migraines who presents with chest pain and dizziness. She was walking at work and noted substernal chest pain, described as pressure, non-radiating, pleuritic but not exertional. Pain has been constant, currently 9/10. No alleviating factors. Associated with nausea but no diaphoresis. She does endorse painful breathing. Her coworker took her pulse and BP at work which were both elevated (120's and 170/90). EMS was called. She received ASA and nitro X 2 with no improvement of her symptoms. She was admitted to our hospital last year for similar symptoms. She was admitted and ruled out with troponins. She did not have a stress test. Only risk factors for CAD are family history. No VTE risk factors.    Past Medical History  Diagnosis Date  . Myocardial infarction     "today" (12/16/2012)  . Microcytic anemia 12/16/2012  . Migraines     "maybe monthly" (12/16/2012)  . Chronic lower back pain     "probably have it twice/wk" (12/16/2012)   Past Surgical History  Procedure Laterality Date  . Tonsillectomy  1992?  [name] Cesarean section  1988  . Tubal ligation  1995   No family history on file. History  Substance Use Topics  . Smoking status: Never Smoker   . Smokeless tobacco: Never Used  . Alcohol Use: No   OB History   Grav Para Term Preterm Abortions TAB SAB Ect Mult Living                 Review of Systems  Constitutional: Negative for fever, chills, appetite change and fatigue.  Eyes: Negative for photophobia and visual disturbance.  Respiratory: Positive for shortness of breath. Negative for cough.   Cardiovascular: Positive for chest pain. Negative for leg swelling.  Gastrointestinal: Positive for nausea. Negative for vomiting, abdominal pain, diarrhea  and constipation.  Genitourinary: Negative for dysuria, frequency and decreased urine volume.  Musculoskeletal: Negative for arthralgias, back pain, gait problem and myalgias.  Skin: Negative for color change and wound.  Neurological: Negative for dizziness, syncope, light-headedness and headaches.  Psychiatric/Behavioral: Negative for confusion and agitation.  All other systems reviewed and are negative.    Allergies  Review of patient's allergies indicates no known allergies.  Home Medications   Current Outpatient Rx  Name  Route  Sig  Dispense  Refill  . azithromycin (ZITHROMAX Z-PAK) 250 MG tablet      Take as directed   6 each   0   . benzonatate (TESSALON) 100 MG capsule   Oral   Take 1 capsule (100 mg total) by mouth 3 (three) times daily as needed for cough.   30 capsule   1   . cetirizine (ZYRTEC) 10 MG tablet   Oral   Take 10 mg by mouth as needed for allergies.         . fluticasone (FLONASE) 50 MCG/ACT nasal spray   Each Nare   Place 2 sprays into both nostrils daily.   16 g   1   . HYDROcodone-acetaminophen (NORCO/VICODIN) 5-325 MG per tablet   Oral   Take 1-2 tablets by mouth every 6 (six) hours as needed for moderate pain.         [name]  LORazepam (ATIVAN) 1 MG tablet   Oral   Take 1 tablet (1 mg total) by mouth every 8 (eight) hours as needed for anxiety.   90 tablet   3   . methocarbamol (ROBAXIN) 500 MG tablet   Oral   Take 1 tablet (500 mg total) by mouth 2 (two) times daily.   20 tablet   0   . metoCLOPramide (REGLAN) 10 MG tablet   Oral   Take 1 tablet (10 mg total) by mouth every 8 (eight) hours as needed for nausea or vomiting.   60 tablet   1   . naproxen (NAPROSYN) 500 MG tablet   Oral   Take 1 tablet (500 mg total) by mouth 2 (two) times daily.   30 tablet   0    BP 129/82  Pulse 80  Temp(Src) 98 F (36.7 C) (Oral)  Resp 18  SpO2 100%  LMP 08/30/2013 Physical Exam  Nursing note and vitals reviewed. Constitutional:  She is oriented to person, place, and time. She appears well-developed and well-nourished. No distress.  HENT:  Head: Normocephalic and atraumatic.  Mouth/Throat: Oropharynx is clear and moist.  Eyes: Conjunctivae and EOM are normal. Pupils are equal, round, and reactive to light.  Neck: Normal range of motion. Neck supple.  Cardiovascular: Normal rate, regular rhythm, normal heart sounds and intact distal pulses.   Pulmonary/Chest: Effort normal and breath sounds normal. No respiratory distress. She exhibits bony tenderness (anterior chest).  Abdominal: Soft. Bowel sounds are normal. There is no tenderness. There is no rebound and no guarding.  Musculoskeletal: Normal range of motion. She exhibits no edema and no tenderness.  Neurological: She is alert and oriented to person, place, and time. No cranial nerve deficit. Coordination normal.  Skin: Skin is warm and dry. No rash noted.  Psychiatric: She has a normal mood and affect. Her behavior is normal.    ED Course  Procedures (including critical care time) Labs Review Labs Reviewed  CBC - Abnormal; Notable for the following:    Hemoglobin 11.8 (*)    HCT 34.9 (*)    All other components within normal limits  BASIC METABOLIC PANEL - Abnormal; Notable for the following:    GFR calc non Af Amer 61 (*)    GFR calc Af Amer 71 (*)    All other components within normal limits  D-DIMER, QUANTITATIVE  TROPONIN I  TROPONIN I   Imaging Review Dg Chest Port 1 View  09/05/2013   CLINICAL DATA:  Mid chest pain.  History of myocardial infarction.  EXAM: PORTABLE CHEST - 1 VIEW  COMPARISON:  DG CHEST 2 VIEW dated 07/22/2013; DG CHEST 2 VIEW dated 12/16/2012  FINDINGS: 0922 hr. There are lower lung volumes with resulting mild bibasilar atelectasis. No edema, confluent airspace opacity, pneumothorax or significant pleural effusion is identified. The heart size and mediastinal contours are stable. Telemetry leads overlie the chest.  IMPRESSION:  Suboptimal inspiration with mild resulting bibasilar atelectasis. No other significant changes identified.   Electronically Signed   By: Roxy Horseman M.D.   On: 09/05/2013 09:30    EKG Interpretation    Date/Time:  Monday September 05 2013 08:51:17 EST Ventricular Rate:  88 PR Interval:  162 QRS Duration: 78 QT Interval:  358 QTC Calculation: 433 R Axis:   64 Text Interpretation:  Age not entered, assumed to be  48 years old for purpose of ECG interpretation Sinus rhythm Abnormal R-wave progression, early transition No significant change was found  Confirmed by [facility] CenterWOFFORD  MD, TREY ([id number]) on 09/05/2013 12:25:17 PM            MDM  48 yo F with history of anemia and migraines who presents with chest pain. Initial ECG without evidence of ongoing ischemia or STE. Low suspicion for dissection given character of pain, no widened mediastinum on CXR, equal pulses. Some concern for PE given pleuritic nature of pain, low risk by Well's, d-dimer negative. CXR also negative for consolidation, PTX or effusion. We obtained a delta troponin given HEART score of 3. Both troponin's negative. Low suspicion for ACS. Pain likely MSK in nature. Her pain was treated with morphine X 2 followed by Toradol with some improvement. Felt she was stable for outpatient management as she is HD stable, reassuring w/u, MI ruled out. Will have patient f/u with her PCP. Strict return precautions given. The patient was in agreement with plan.   Reviewed imaging, labs, ECG and previous medical records, utilized in MDM  Discussed case with Dr. Loretha StaplerWofford  Clinical Impression 1. Atypical chest pain     Margie BilletMathias Glendora Clouatre, MD 09/06/13 [phone]

## 2013-09-05 NOTE — ED Notes (Signed)
MD at bedside. 

## 2013-09-05 NOTE — ED Notes (Signed)
Lab called rn and was informed that troponin would have to be recollected because it was out of the time frame to run. Lab was called at 1140. See previous noted.

## 2013-09-05 NOTE — ED Notes (Addendum)
Lab called for update on D-dimer states that it is in process. Troponin that was collected by phlebotomy was not ran yet but states that they will add it on at this time.

## 2013-09-05 NOTE — Discharge Instructions (Signed)
You did not have a heart attack. Your chest x-ray was normal as well. I am not sure what is causing your chest pain. Please call your doctor tomorrow for further follow up. Since you did not have a stress test last year, I would recommend talking to your doctor about doing this.   Chest Pain (Nonspecific) Chest pain has many causes. Your pain could be caused by something serious, such as a heart attack or a blood clot in the lungs. It could also be caused by something less serious, such as a chest bruise or a virus. Follow up with your doctor. More lab tests or other studies may be needed to find the cause of your pain. Most of the time, nonspecific chest pain will improve within 2 to 3 days of rest and mild pain medicine. HOME CARE  For chest bruises, you may put ice on the sore area for 15-20 minutes, 03-04 times a day. Do this only if it makes you feel better.  Put ice in a plastic bag.  Place a towel between the skin and the bag.  Rest for the next 2 to 3 days.  Go back to work if the pain improves.  See your doctor if the pain lasts longer than 1 to 2 weeks.  Only take medicine as told by your doctor.  Quit smoking if you smoke. GET HELP RIGHT AWAY IF:   There is more pain or pain that spreads to the arm, neck, jaw, back, or belly (abdomen).  You have shortness of breath.  You cough more than usual or cough up blood.  You have very bad back or belly pain, feel sick to your stomach (nauseous), or throw up (vomit).  You have very bad weakness.  You pass out (faint).  You have a fever. Any of these problems may be serious and may be an emergency. Do not wait to see if the problems will go away. Get medical help right away. Call your local emergency services 911 in [place] Do not drive yourself to the hospital. MAKE SURE YOU:   Understand these instructions.  Will watch this condition.  Will get help right away if you or your child is not doing well or gets worse. Document  Released: 01/07/2008 Document Revised: 10/13/2011 Document Reviewed: 01/07/2008 [facility] Patient Information 2014 [employer] [place], [place].

## 2013-09-05 NOTE — ED Notes (Signed)
Pt reports feeling dizzy this morning while walking down the hall. Pt states that that is how she feels when she is a. Fib. Pt states that after the initial dizziness the pain began. Pain is central with no radiation.  Pt reports SOB and nausea.

## 2013-09-05 NOTE — ED Provider Notes (Signed)
I saw and evaluated the patient, reviewed the resident's note and I agree with the findings and plan.  EKG Interpretation    Date/Time:  Monday September 05 2013 08:51:17 EST Ventricular Rate:  88 PR Interval:  162 QRS Duration: 78 QT Interval:  358 QTC Calculation: 433 R Axis:   64 Text Interpretation:  Age not entered, assumed to be  48 years old for purpose of ECG interpretation Sinus rhythm Abnormal R-wave progression, early transition No significant change was found Confirmed by [facility]  MD, TREY ([id number]) on 09/05/2013 12:25:17 PM            48 yo female with chest pain.  Central, constant, tightness.  Low suspicion for ACS or PE.  Presentation not c/w dissection.  On exam, well appearing, no distress, normal respiratory effort, lungs CTAB, heart sounds normal, RRR, central chest is TTP.  Plan delta troponin, but feel that costochondritis is most likely based on history and exam.    Clinical Impression: 1. Atypical chest pain       Candyce ChurnJohn David Christalynn Boise III, MD 09/06/13 [phone]

## 2013-09-05 NOTE — ED Notes (Signed)
Pt received 324mg  of Asprin, 2 nitro with no relief and 4mg  of zofran en route.

## 2013-09-06 NOTE — ED Provider Notes (Signed)
I saw and evaluated the patient, reviewed the resident's note and I agree with the findings and plan.  EKG Interpretation    Date/Time:  Monday September 05 2013 08:51:17 EST Ventricular Rate:  88 PR Interval:  162 QRS Duration: 78 QT Interval:  358 QTC Calculation: 433 R Axis:   64 Text Interpretation:  Age not entered, assumed to be  48 years old for purpose of ECG interpretation Sinus rhythm Abnormal R-wave progression, early transition No significant change was found Confirmed by [facility]  MD, TREY ([id number]) on 09/05/2013 12:25:17 PM              Monique ChurnJohn David Elka Satterfield III, MD 09/06/13 [phone]

## 2013-10-10 ENCOUNTER — Emergency Department (HOSPITAL_COMMUNITY): Payer: 59

## 2013-10-10 ENCOUNTER — Encounter (HOSPITAL_COMMUNITY): Payer: Self-pay | Admitting: Emergency Medicine

## 2013-10-10 ENCOUNTER — Observation Stay (HOSPITAL_COMMUNITY)
Admission: EM | Admit: 2013-10-10 | Discharge: 2013-10-12 | Disposition: A | Discharge status: Home / Self Care | Payer: 59 | Attending: Internal Medicine | Admitting: Internal Medicine

## 2013-10-10 DIAGNOSIS — L301 Dyshidrosis [pompholyx]: Secondary | ICD-10-CM | POA: Insufficient documentation

## 2013-10-10 DIAGNOSIS — Z862 Personal history of diseases of the blood and blood-forming organs and certain disorders involving the immune mechanism: Secondary | ICD-10-CM | POA: Insufficient documentation

## 2013-10-10 DIAGNOSIS — R110 Nausea: Secondary | ICD-10-CM | POA: Insufficient documentation

## 2013-10-10 DIAGNOSIS — R0602 Shortness of breath: Secondary | ICD-10-CM | POA: Insufficient documentation

## 2013-10-10 DIAGNOSIS — Z79899 Other long term (current) drug therapy: Secondary | ICD-10-CM | POA: Insufficient documentation

## 2013-10-10 DIAGNOSIS — G43909 Migraine, unspecified, not intractable, without status migrainosus: Secondary | ICD-10-CM | POA: Insufficient documentation

## 2013-10-10 DIAGNOSIS — R079 Chest pain, unspecified: Secondary | ICD-10-CM

## 2013-10-10 DIAGNOSIS — I10 Essential (primary) hypertension: Secondary | ICD-10-CM

## 2013-10-10 DIAGNOSIS — R0789 Other chest pain: Principal | ICD-10-CM | POA: Insufficient documentation

## 2013-10-10 HISTORY — DX: Low back pain: M54.5

## 2013-10-10 HISTORY — DX: Low back pain, unspecified: M54.50

## 2013-10-10 HISTORY — DX: Other chest pain: R07.89

## 2013-10-10 HISTORY — DX: Essential (primary) hypertension: I10

## 2013-10-10 HISTORY — DX: Abnormal findings on diagnostic imaging of other specified body structures: R93.89

## 2013-10-10 LAB — CBC WITH DIFFERENTIAL/PLATELET
Basophils Absolute: 0 10*3/uL (ref 0.0–0.1)
Basophils Relative: 1 % (ref 0–1)
EOS ABS: 0.1 10*3/uL (ref 0.0–0.7)
Eosinophils Relative: 2 % (ref 0–5)
HCT: 34.5 % — ABNORMAL LOW (ref 36.0–46.0)
Hemoglobin: 11.6 g/dL — ABNORMAL LOW (ref 12.0–15.0)
LYMPHS ABS: 2.5 10*3/uL (ref 0.7–4.0)
Lymphocytes Relative: 46 % (ref 12–46)
MCH: 27.1 pg (ref 26.0–34.0)
MCHC: 33.6 g/dL (ref 30.0–36.0)
MCV: 80.6 fL (ref 78.0–100.0)
MONO ABS: 0.4 10*3/uL (ref 0.1–1.0)
MONOS PCT: 7 % (ref 3–12)
NEUTROS PCT: 46 % (ref 43–77)
Neutro Abs: 2.5 10*3/uL (ref 1.7–7.7)
Platelets: 298 10*3/uL (ref 150–400)
RBC: 4.28 MIL/uL (ref 3.87–5.11)
RDW: 14.5 % (ref 11.5–15.5)
WBC: 5.6 10*3/uL (ref 4.0–10.5)

## 2013-10-10 LAB — BASIC METABOLIC PANEL
BUN: 10 mg/dL (ref 6–23)
CO2: 23 mEq/L (ref 19–32)
CREATININE: 1.02 mg/dL (ref 0.50–1.10)
Calcium: 9.2 mg/dL (ref 8.4–10.5)
Chloride: 103 mEq/L (ref 96–112)
GFR calc Af Amer: 74 mL/min — ABNORMAL LOW (ref >90)
GFR calc non Af Amer: 64 mL/min — ABNORMAL LOW (ref >90)
GLUCOSE: 87 mg/dL (ref 70–99)
POTASSIUM: 4.3 meq/L (ref 3.7–5.3)
Sodium: 138 mEq/L (ref 137–147)

## 2013-10-10 LAB — HEPATIC FUNCTION PANEL
ALT: 13 U/L (ref 0–35)
AST: 16 U/L (ref 0–37)
Albumin: 3.3 g/dL — ABNORMAL LOW (ref 3.5–5.2)
Alkaline Phosphatase: 67 U/L (ref 39–117)
BILIRUBIN TOTAL: 0.2 mg/dL — AB (ref 0.3–1.2)
Total Protein: 7 g/dL (ref 6.0–8.3)

## 2013-10-10 LAB — TROPONIN I

## 2013-10-10 LAB — I-STAT TROPONIN, ED: Troponin i, poc: 0 ng/mL (ref 0.00–0.08)

## 2013-10-10 LAB — D-DIMER, QUANTITATIVE (NOT AT ARMC): D-Dimer, Quant: 0.5 ug/mL-FEU — ABNORMAL HIGH (ref 0.00–0.48)

## 2013-10-10 LAB — MRSA PCR SCREENING: MRSA BY PCR: NEGATIVE

## 2013-10-10 LAB — PRO B NATRIURETIC PEPTIDE: Pro B Natriuretic peptide (BNP): 41 pg/mL (ref 0–125)

## 2013-10-10 MED ORDER — IRBESARTAN 150 MG PO TABS
150.0000 mg | ORAL_TABLET | Freq: Every day | ORAL | Status: DC
Start: 2013-10-10 — End: 2013-10-12
  Administered 2013-10-10 – 2013-10-12 (×3): 150 mg via ORAL
  Filled 2013-10-10 (×3): qty 1

## 2013-10-10 MED ORDER — SODIUM CHLORIDE 0.9 % IJ SOLN
3.0000 mL | Freq: Two times a day (BID) | INTRAMUSCULAR | Status: DC
  Administered 2013-10-10 – 2013-10-12 (×4): 3 mL via INTRAVENOUS

## 2013-10-10 MED ORDER — NITROGLYCERIN 0.4 MG SL SUBL
0.4000 mg | SUBLINGUAL_TABLET | SUBLINGUAL | Status: AC | PRN
  Administered 2013-10-10 (×3): 0.4 mg via SUBLINGUAL

## 2013-10-10 MED ORDER — HYDROMORPHONE HCL PF 1 MG/ML IJ SOLN
0.5000 mg | INTRAMUSCULAR | Status: DC | PRN
  Administered 2013-10-10: 0.5 mg via INTRAVENOUS
  Filled 2013-10-10: qty 1

## 2013-10-10 MED ORDER — MORPHINE SULFATE 4 MG/ML IJ SOLN
4.0000 mg | Freq: Once | INTRAMUSCULAR | Status: AC
  Administered 2013-10-10: 4 mg via INTRAVENOUS
  Filled 2013-10-10: qty 1

## 2013-10-10 MED ORDER — SODIUM CHLORIDE 0.9 % IJ SOLN
3.0000 mL | INTRAMUSCULAR | Status: DC | PRN
Start: 2013-10-10 — End: 2013-10-12

## 2013-10-10 MED ORDER — MORPHINE SULFATE 2 MG/ML IJ SOLN
2.0000 mg | Freq: Once | INTRAMUSCULAR | Status: AC
  Administered 2013-10-10: 2 mg via INTRAVENOUS
  Filled 2013-10-10: qty 1

## 2013-10-10 MED ORDER — ASPIRIN EC 81 MG PO TBEC
81.0000 mg | DELAYED_RELEASE_TABLET | Freq: Every day | ORAL | Status: DC
  Administered 2013-10-11: 13:00:00 via ORAL
  Filled 2013-10-10: qty 1

## 2013-10-10 MED ORDER — ASPIRIN 81 MG PO CHEW
324.0000 mg | CHEWABLE_TABLET | Freq: Once | ORAL | Status: AC
  Administered 2013-10-10: 324 mg via ORAL
  Filled 2013-10-10: qty 4

## 2013-10-10 MED ORDER — HEPARIN SODIUM (PORCINE) 5000 UNIT/ML IJ SOLN
5000.0000 [IU] | Freq: Three times a day (TID) | INTRAMUSCULAR | Status: DC
  Administered 2013-10-10 – 2013-10-12 (×5): 5000 [IU] via SUBCUTANEOUS
  Filled 2013-10-10 (×8): qty 1

## 2013-10-10 MED ORDER — KETOROLAC TROMETHAMINE 30 MG/ML IJ SOLN
30.0000 mg | Freq: Once | INTRAMUSCULAR | Status: AC
  Administered 2013-10-10: 30 mg via INTRAVENOUS
  Filled 2013-10-10: qty 1

## 2013-10-10 MED ORDER — SODIUM CHLORIDE 0.9 % IV SOLN: 250.0000 mL | INTRAVENOUS | Status: DC | PRN

## 2013-10-10 NOTE — ED Notes (Signed)
Pt presents to department for evaluation of midsternal chest pain radiating to L arm and L jaw. Onset yesterday while walking. 8/10 intermittent pressure sensation. Also states SOB and diaphoresis. Pt is alert and oriented x4.

## 2013-10-10 NOTE — H&P (Signed)
Pt. Seen and examined. Agree with the NP/PA-C note as written. Pleasant 48 yo female with what sound like both typical anginal symptoms and atypical anginal symptoms. She reports worsening dyspnea and decreased exercise tolerance, as well as SSCP with radiation to her left arm and jaw.  However, she is very tender on the sternum and this is reproducible. She also has worsening pain when lying supine and it improves with sitting upright. She is also complaining of dizziness and HA. This is her 3rd ER visit for chest pain - she has not followed up for outpatient stress testing. CE's have been negative. EKG is essentially normal - possibly borderline diffuse ST elevation (<1 mm).  Impression: 1. Chest pain - could be UA, pericarditis, costochondritis, etc. 2. Uncontrolled HTN 3. Soft systolic murmur  Recommend: 1.  Admit to stepdown due to ongoing CP - recommend Tordol now to see if there is improvement 2.  NPO p MN for NST tomorrow 3.  2D echo to evaluate for pericarditis or effusion and systolic murmur - check ESR 4.  No heparin unless CE's are positive. - no benefit from nitro, but she has headache, would avoid  Thanks for consulting [place].  Pixie Casino, MD, [facility] Attending Cardiologist [place]

## 2013-10-10 NOTE — ED Notes (Signed)
Unable to give report.  RN transferring pt.

## 2013-10-10 NOTE — ED Provider Notes (Signed)
CSN: [id number]     Arrival date & time 10/10/13  1223 History   First MD Initiated Contact with Patient 10/10/13 1302     Chief Complaint  Patient presents with  . Chest Pain  . Shortness of Breath     (Consider location/radiation/quality/duration/timing/severity/associated sxs/prior Treatment) HPI Comments: Patient presents with substernal chest pain it radiates to her left arm and left jaw. This onset yesterday when she was walking. Exertion makes worse. Is associated with shortness of breath, nausea and diaphoresis. Patient is pain-free at this time. She states that she's had a heart attack previously but does not have a cardiologist. He denies any shortness of breath. She denies any leg pain or leg swelling. No abdominal pain. The pain is worse with deep breathing. Worse with exertion. It is better with rest. She did not take anything at home for the pain.  The history is provided by the patient.    Past Medical History  Diagnosis Date  . Microcytic anemia     a. No prior workup.  . Migraines   . Low back pain    Past Surgical History  Procedure Laterality Date  . Tonsillectomy  1992?  [name] Cesarean section  1988  . Tubal ligation  1995   Family History  Problem Relation Age of Onset  . Heart disease Father     Dx early 53's (? heart valve), died of MI at 83  . Congestive Heart Failure Mother     Died in her 18s - CHF   . Other      Siblings - No CAD   History  Substance Use Topics  . Smoking status: Never Smoker   . Smokeless tobacco: Never Used  . Alcohol Use: No   OB History   Grav Para Term Preterm Abortions TAB SAB Ect Mult Living                 Review of Systems  Constitutional: Negative for fever, activity change and appetite change.  HENT: Negative for congestion and rhinorrhea.   Respiratory: Positive for chest tightness and shortness of breath.   Cardiovascular: Positive for chest pain.  Gastrointestinal: Negative for nausea, vomiting and abdominal  pain.  Genitourinary: Negative for dysuria and hematuria.  Musculoskeletal: Negative for arthralgias and myalgias.  Skin: Negative for rash.  Neurological: Negative for dizziness, weakness and light-headedness.  A complete 10 system review of systems was obtained and all systems are negative except as noted in the HPI and PMH.      Allergies  Review of patient's allergies indicates no known allergies.  Home Medications   Current Outpatient Rx  Name  Route  Sig  Dispense  Refill  . aspirin-acetaminophen-caffeine (EXCEDRIN MIGRAINE) 250-250-65 MG per tablet   Oral   Take 1 tablet by mouth every 6 (six) hours as needed for migraine.          BP 116/76  Pulse 77  Temp(Src) 98.1 F (36.7 C) (Oral)  Resp 19  Ht 5\' 6"  (1.676 m)  Wt 189 lb (85.73 kg)  BMI 30.52 kg/m2  SpO2 100%  LMP 10/06/2013 Physical Exam  Constitutional: She is oriented to person, place, and time. She appears well-developed and well-nourished. No distress.  HENT:  Head: Normocephalic and atraumatic.  Mouth/Throat: Oropharynx is clear and moist. No oropharyngeal exudate.  Eyes: Conjunctivae and EOM are normal.  Neck: Normal range of motion. Neck supple.  Cardiovascular: Normal rate, regular rhythm and normal heart sounds.   Pulmonary/Chest: Breath  sounds normal. No respiratory distress. She exhibits tenderness.  Abdominal: Soft. There is no tenderness. There is no rebound and no guarding.  Musculoskeletal: Normal range of motion. She exhibits no edema and no tenderness.  Neurological: She is alert and oriented to person, place, and time. No cranial nerve deficit. She exhibits normal muscle tone. Coordination normal.  Skin: Skin is warm.    ED Course  Procedures (including critical care time) Labs Review Labs Reviewed  CBC WITH DIFFERENTIAL - Abnormal; Notable for the following:    Hemoglobin 11.6 (*)    HCT 34.5 (*)    All other components within normal limits  BASIC METABOLIC PANEL - Abnormal;  Notable for the following:    GFR calc non Af Amer 64 (*)    GFR calc Af Amer 74 (*)    All other components within normal limits  D-DIMER, QUANTITATIVE - Abnormal; Notable for the following:    D-Dimer, Quant 0.50 (*)    All other components within normal limits  I-STAT TROPOININ, ED   Imaging Review Dg Chest 2 View  10/10/2013   CLINICAL DATA:  Chest pain and shortness of breath.  EXAM: CHEST  2 VIEW  COMPARISON:  09/05/2013  FINDINGS: The heart size and mediastinal contours are within normal limits. Both lungs are clear. The visualized skeletal structures are unremarkable.  IMPRESSION: No active cardiopulmonary disease.   Electronically Signed   By: Amie Portlandavid  Ormond M.D.   On: 10/10/2013 13:17     EKG Interpretation   Date/Time:  Monday October 10 2013 13:11:57 EDT Ventricular Rate:  93 PR Interval:  165 QRS Duration: 80 QT Interval:  364 QTC Calculation: 453 R Axis:   111 Text Interpretation:  Right and left arm electrode reversal,  interpretation assumes no reversal Sinus rhythm Probable right ventricular  hypertrophy Abnormal T, consider ischemia, lateral leads Nonspecific T  wave abnormality Confirmed by Manus GunningANCOUR  MD, Nyema Hachey [phone]([id number]) on 10/10/2013  1:19:40 PM      MDM   Final diagnoses:  Chest pain on exertion   Substernal chest pain left arm and left jaw that is exertional. Associated with shortness of breath diaphoresis and nausea. EKG is unchanged from previous. No acute ST segment changes. She does have pain to palpation of sternum.   No history of MI despite patient's claim that she has had one. Her story is somewhat concerning for angina and she is given aspirin nitroglycerin. Troponin is negative. Chest x-ray is negative. D-dimer 0.5 but no tachycardic, tachypnea or hypoxia.  Discussed with cardiology who plans to admit for stress testing as her history does both typical and atypical components.    Glynn OctaveStephen Omega Durante, MD 10/10/13 [phone]

## 2013-10-10 NOTE — Discharge Planning (Signed)
[facility] Monique Aguilar, [facility]  Spoke to patient about primary care resources, patient states she does have insurance but doesn't have the card with her at this time. I explained to the patient how to find a provider in her network through her insurance. Resource guide and my contact information given for any future questions or concerns.

## 2013-10-10 NOTE — ED Notes (Signed)
Pt states she is getting dizzier.

## 2013-10-10 NOTE — H&P (Signed)
History and Physical  Patient ID: Nyoka LintCathy A Varney MRN: [id number], DOB: 1966/04/29 Date of Encounter: 10/10/2013, 2:43 PM Primary Physician: No PCP Per Patient Primary Cardiologist: New to cardiology  Chief Complaint: chest pain Reason for Admission: chest pain  HPI:Ms. 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 She has no prior cardiac history, but does have a history of chest pain. She was admitted for obs by internal medicine in 12/2012 for chest discomfort. She ruled out for MI and outpatient stress test was recommended but it does not appear that she went for this. She was evaluated in ER in February for CP deemed not cardiac (tender to palpation), ruled out, and sent home. She was shopping yesterday when she developed substernal chest pain that radiated to her left arm and jaw. It was associated with mild dizziness and SOB. The pain became worse as she walked longer distances. When she stopped to rest during shopping, pain did not resolve. However, when she got home and rested she felt better. Total duration of pain was 1 hour. She went to bed as usual. While getting ready for work this morning, she felt OK. When she got to work she felt dizzy and SOB. She asked the nurse to take her blood pressure which was 159/92. She was sent to the ER for further evaluation. Since 9:30am, she has had persistent chest discomfort rated 8/10. 3 SL NTG did not relieve pain - only gave her headache. She reports recent worsening exertional dyspnea. Denies palpitations, syncope, LEE, orthopnea, bleeding, recent surgery/travel/bedrest. Pain is not worse with inspiration or meals. She does have increased discomfort upon palpation. She does not exercise regularly. She has been checking her blood pressure at home recently at the advice of colleagues and BP has been running 150-190s systolic. She does not  smoke, drink, use drugs, or take regularly prescribed medicine. She takes rare Excedrine. Troponin neg x 1. Hgb 11.6 (stable). D-dimer 0.50. She is not tachycardic, tachypnic or hypoxic.  Past Medical History  Diagnosis Date  . Microcytic anemia 12/16/2012  . Migraines   . Low back pain      Most Recent Cardiac Studies: None   Surgical History:  Past Surgical History  Procedure Laterality Date  . Tonsillectomy  1992?  [name]. Cesarean section  1988  . Tubal ligation  1995     Home Meds: Prior to Admission medications   Medication Sig Start Date End Date Taking? Authorizing Provider  aspirin-acetaminophen-caffeine (EXCEDRIN MIGRAINE) [phone] 7617250-250-65 MG per tablet Take 1 tablet by mouth every 6 (six) hours as needed for migraine.   Yes Historical Provider, MD    Allergies: No Known Allergies  History   Social History  . Marital Status: Single    Spouse Name: N/A    Number of Children: N/A  . Years of Education: N/A   Occupational History  . Not on file.   Social History Main Topics  . Smoking status: Never Smoker   . Smokeless tobacco: Never Used  . Alcohol Use: No  . Drug Use: No  . Sexual Activity: Not Currently    Birth Control/ Protection: Surgical   Other Topics Concern  . Not on file   Social History Narrative  . No narrative on file     Family History  Problem Relation Age of Onset  . Heart disease Father     Dx early  40's (? heart valve), died of MI at 38  . Congestive Heart Failure Mother     Died in her 88s - CHF     Review of Systems: General: negative for chills, fever, night sweats or weight changes.  Cardiovascular: see above Dermatological: negative for rash Respiratory: negative for cough or wheezing Urologic: negative for hematuria Abdominal: negative for nausea, vomiting, diarrhea, bright red blood per rectum, melena, or hematemesis Neurologic: negative for visual changes, syncope All other systems reviewed and are otherwise negative except as  noted above.  Labs:   Lab Results  Component Value Date   WBC 5.6 10/10/2013   HGB 11.6* 10/10/2013   HCT 34.5* 10/10/2013   MCV 80.6 10/10/2013   PLT 298 10/10/2013    Recent Labs Lab 10/10/13 1236  NA 138  K 4.3  CL 103  CO2 23  BUN 10  CREATININE 1.02  CALCIUM 9.2  GLUCOSE 87   Troponin neg x1   Lab Results  Component Value Date   CHOL 131 12/17/2012   HDL 33* 12/17/2012   LDLCALC 69 12/17/2012   TRIG 147 12/17/2012   Lab Results  Component Value Date   DDIMER 0.50* 10/10/2013    Radiology/Studies:  Dg Chest 2 View 10/10/2013   CLINICAL DATA:  Chest pain and shortness of breath.  EXAM: CHEST  2 VIEW  COMPARISON:  09/05/2013  FINDINGS: The heart size and mediastinal contours are within normal limits. Both lungs are clear. The visualized skeletal structures are unremarkable.  IMPRESSION: No active cardiopulmonary disease.   Electronically Signed   By: Amie Portland M.D.   On: 10/10/2013 13:17     EKG: NSR 84bpm no acute ST-T changes, unchanged from 09/2013  Physical Exam: Blood pressure 130/68, pulse 84, temperature 98.1 F (36.7 C), temperature source Oral, resp. rate 13, height 5\' 6"  (1.676 m), weight 189 lb (85.73 kg), last menstrual period 10/06/2013, SpO2 98.00%. General: Well developed, well nourished AAF, in no acute distress. Head: Normocephalic, atraumatic, sclera non-icteric, no xanthomas, nares are without discharge.  Neck: Negative for carotid bruits. JVD not elevated. Lungs: Clear bilaterally to auscultation without wheezes, rales, or rhonchi. Breathing is unlabored. Heart: RRR with S1 S2. Soft SEM at LUSB. No rubs or gallops appreciated. Abdomen: Soft, non-tender, non-distended with normoactive bowel sounds. No hepatomegaly. No rebound/guarding. No obvious abdominal masses. Msk:  Strength and tone appear normal for age. Extremities: No clubbing or cyanosis. No edema.  Distal pedal pulses are 2+ and equal bilaterally. Radial pulses equal bilaterally. Neuro: Alert and  oriented X 3. No focal deficit. No facial asymmetry. Moves all extremities spontaneously. Psych:  Responds to questions appropriately with a normal affect.    ASSESSMENT AND PLAN:  1. Chest pain/dyspnea on exertion/dizziness 2. Soft systolic murmur 3. Likely new diagnosis of HTN  Symptoms with typical and atypical features. No objective evidence of ischemia thus far. Admit, cycle enzymes, add daily aspirin, check echocardiogram. Add beta blocker for improved blood pressure control as this may be contributing to symptoms. Doubt PE as there are no clinical signs by vitals or physical exam. Mediastinal contours normal on CXR. If she rules out, plan exercise nuclear stress test in AM. If rules in, will need cardiac catheterization. Hold off heparin unless enzymes turn positive. Check sed rate, check lipid panel. Trial of Toradol in ER.  Signed, Ronie Spies PA-C 10/10/2013, 2:43 PM

## 2013-10-11 ENCOUNTER — Observation Stay (HOSPITAL_COMMUNITY): Payer: 59

## 2013-10-11 ENCOUNTER — Encounter: Payer: Self-pay | Admitting: Physician Assistant

## 2013-10-11 DIAGNOSIS — R079 Chest pain, unspecified: Secondary | ICD-10-CM

## 2013-10-11 DIAGNOSIS — I10 Essential (primary) hypertension: Secondary | ICD-10-CM

## 2013-10-11 DIAGNOSIS — I517 Cardiomegaly: Secondary | ICD-10-CM

## 2013-10-11 DIAGNOSIS — R0789 Other chest pain: Secondary | ICD-10-CM

## 2013-10-11 LAB — BASIC METABOLIC PANEL
BUN: 14 mg/dL (ref 6–23)
CO2: 20 mEq/L (ref 19–32)
Calcium: 8.8 mg/dL (ref 8.4–10.5)
Chloride: 102 mEq/L (ref 96–112)
Creatinine, Ser: 0.99 mg/dL (ref 0.50–1.10)
GFR, EST AFRICAN AMERICAN: 77 mL/min — AB (ref >90)
GFR, EST NON AFRICAN AMERICAN: 66 mL/min — AB (ref >90)
Glucose, Bld: 91 mg/dL (ref 70–99)
Potassium: 5.1 mEq/L (ref 3.7–5.3)
Sodium: 135 mEq/L — ABNORMAL LOW (ref 137–147)

## 2013-10-11 LAB — CBC
HEMATOCRIT: 33 % — AB (ref 36.0–46.0)
Hemoglobin: 11.2 g/dL — ABNORMAL LOW (ref 12.0–15.0)
MCH: 27.5 pg (ref 26.0–34.0)
MCHC: 33.9 g/dL (ref 30.0–36.0)
MCV: 81.1 fL (ref 78.0–100.0)
Platelets: 326 10*3/uL (ref 150–400)
RBC: 4.07 MIL/uL (ref 3.87–5.11)
RDW: 14.6 % (ref 11.5–15.5)
WBC: 6.5 10*3/uL (ref 4.0–10.5)

## 2013-10-11 LAB — LIPID PANEL
Cholesterol: 151 mg/dL (ref 0–200)
HDL: 48 mg/dL (ref >39)
LDL CALC: 71 mg/dL (ref 0–99)
TRIGLYCERIDES: 162 mg/dL — AB (ref <150)
Total CHOL/HDL Ratio: 3.1 RATIO
VLDL: 32 mg/dL (ref 0–40)

## 2013-10-11 LAB — TROPONIN I
Troponin I: <0.3 ng/mL (ref <0.30)
Troponin I: <0.3 ng/mL (ref <0.30)

## 2013-10-11 LAB — TSH: TSH: 3.88 u[IU]/mL (ref 0.350–4.500)

## 2013-10-11 LAB — SEDIMENTATION RATE: Sed Rate: 30 mm/hr — ABNORMAL HIGH (ref 0–22)

## 2013-10-11 MED ORDER — TECHNETIUM TC 99M SESTAMIBI GENERIC - CARDIOLITE
10.0000 | Freq: Once | INTRAVENOUS | Status: AC | PRN
  Administered 2013-10-11: 10 via INTRAVENOUS

## 2013-10-11 MED ORDER — ACETAMINOPHEN 500 MG PO TABS
1000.0000 mg | ORAL_TABLET | Freq: Four times a day (QID) | ORAL | Status: DC | PRN
  Administered 2013-10-11: 1000 mg via ORAL
  Filled 2013-10-11: qty 2

## 2013-10-11 MED ORDER — TECHNETIUM TC 99M SESTAMIBI GENERIC - CARDIOLITE
30.0000 | Freq: Once | INTRAVENOUS | Status: AC | PRN
  Administered 2013-10-11: 30 via INTRAVENOUS

## 2013-10-11 MED ORDER — IOHEXOL 350 MG/ML SOLN
100.0000 mL | Freq: Once | INTRAVENOUS | Status: AC | PRN
  Administered 2013-10-11: 100 mL via INTRAVENOUS

## 2013-10-11 MED ORDER — ONDANSETRON HCL 4 MG/2ML IJ SOLN
INTRAMUSCULAR | Status: AC
  Administered 2013-10-11: 4 mg via INTRAVENOUS
  Filled 2013-10-11: qty 2

## 2013-10-11 MED ORDER — REGADENOSON 0.4 MG/5ML IV SOLN
0.4000 mg | Freq: Once | INTRAVENOUS | Status: AC
  Administered 2013-10-11: 0.4 mg via INTRAVENOUS
  Filled 2013-10-11: qty 5

## 2013-10-11 MED ORDER — ONDANSETRON HCL 4 MG/2ML IJ SOLN
4.0000 mg | Freq: Once | INTRAMUSCULAR | Status: AC
  Administered 2013-10-11: 4 mg via INTRAVENOUS

## 2013-10-11 MED ORDER — ACETAMINOPHEN 325 MG PO TABS
650.0000 mg | ORAL_TABLET | Freq: Four times a day (QID) | ORAL | Status: DC | PRN
  Administered 2013-10-11: 650 mg via ORAL
  Filled 2013-10-11: qty 2

## 2013-10-11 MED ORDER — PANTOPRAZOLE SODIUM 40 MG PO TBEC
40.0000 mg | DELAYED_RELEASE_TABLET | Freq: Every day | ORAL | Status: DC
  Administered 2013-10-12: 40 mg via ORAL
  Filled 2013-10-11: qty 1

## 2013-10-11 MED ORDER — REGADENOSON 0.4 MG/5ML IV SOLN
INTRAVENOUS | Status: AC
  Administered 2013-10-11: 0.4 mg via INTRAVENOUS
  Filled 2013-10-11: qty 5

## 2013-10-11 NOTE — Progress Notes (Signed)
         Subjective: Episodic SOB, no pain currently  Objective: Vital signs in last 24 hours: Temp:  [97.9 F (36.6 C)-99 F (37.2 C)] 98.4 F (36.9 C) (03/10 0740) Pulse Rate:  [62-88] 73 (03/10 0800) Resp:  [9-20] 11 (03/10 0800) BP: (87-152)/(42-96) 133/76 mmHg (03/10 1019) SpO2:  [97 %-100 %] 98 % (03/10 0800) Weight:  [189 lb (85.73 kg)-227 lb 4.7 oz (103.1 kg)] 227 lb 4.7 oz (103.1 kg) (03/09 1754) Weight change:    Intake/Output from previous day: 03/09 0701 - 03/10 0700 In: 360 [P.O.:360] Out: -  Intake/Output this shift:    PE: General:Pleasant affect, NAD Skin:Warm and dry, brisk capillary refill HEENT:normocephalic, sclera clear, mucus membranes moist Heart:S1S2 RRR without murmur, gallup, rub or click Lungs:clear without rales, rhonchi, or wheezes ZOX:WRUEAbd:soft, non tender, + BS, do not palpate liver spleen or masses Ext:no lower ext edema, 2+ pedal pulses, 2+ radial pulses Neuro:alert and oriented, MAE, follows commands, + facial symmetry   Lab Results:  Recent Labs  10/10/13 1236 10/11/13 0125  WBC 5.6 6.5  HGB 11.6* 11.2*  HCT 34.5* 33.0*  PLT 298 326   BMET  Recent Labs  10/10/13 1236 10/11/13 0125  NA 138 135*  K 4.3 5.1  CL 103 102  CO2 23 20  GLUCOSE 87 91  BUN 10 14  CREATININE 1.02 0.99  CALCIUM 9.2 8.8    Recent Labs  10/11/13 10/11/13 0607  TROPONINI <0.30 <0.30    Lab Results  Component Value Date   CHOL 151 10/11/2013   HDL 48 10/11/2013   LDLCALC 71 10/11/2013   TRIG 162* 10/11/2013   CHOLHDL 3.1 10/11/2013   Lab Results  Component Value Date   HGBA1C 5.2 12/16/2012     Lab Results  Component Value Date   TSH 3.880 10/10/2013    Hepatic Function Panel  Recent Labs  10/10/13 2031  PROT 7.0  ALBUMIN 3.3*  AST 16  ALT 13  ALKPHOS 67  BILITOT 0.2*  BILIDIR <0.2  IBILI NOT CALCULATED    Recent Labs  10/11/13 0125  CHOL 151   No results found for this basename: PROTIME,  in the last 72  hours     Studies/Results: Dg Chest 2 View  10/10/2013   CLINICAL DATA:  Chest pain and shortness of breath.  EXAM: CHEST  2 VIEW  COMPARISON:  09/05/2013  FINDINGS: The heart size and mediastinal contours are within normal limits. Both lungs are clear. The visualized skeletal structures are unremarkable.  IMPRESSION: No active cardiopulmonary disease.   Electronically Signed   By: Amie Portlandavid  Ormond M.D.   On: 10/10/2013 13:17    Medications: I have reviewed meds Scheduled Meds: . aspirin EC  81 mg Oral Daily  . heparin  5,000 Units Subcutaneous 3 times per day  . irbesartan  150 mg Oral Daily  . sodium chloride  3 mL Intravenous Q12H   Continuous Infusions:  PRN Meds:.sodium chloride, HYDROmorphone (DILAUDID) injection, sodium chloride  Assessment/Plan: Active Problems:   Chest pain   HTN (hypertension), uncontrolled  PLAN: negative MI, episodic SOB this AM.   Lexiscan cardiolite completed without complications.  Echo pending  LOS: 1 day    [facility] LLC[facility]OLD,LAURA R  Nurse Practitioner Certified Pager [phone] or after 5pm and on weekends call [phone] 10/11/2013, 10:25 AM

## 2013-10-11 NOTE — Progress Notes (Signed)
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  She was just ruled out for cardiac ischemia  Dr Mayford Knifeurner just started Protonix now, not dosed yet Continue this at discharge, it may rake a few days to become effective. [name].    LFTS, Bmet, CBC normal.    I set pt up for outpt appt (will be on 3/16 at 3 pm at GI office with [facility])  Call us back if this is not satisfactory  Maralyn SagoSarah gribbin  [phone].  I have discussed the case with Jennye MoccasinSarah Gribbin PA who set up pt for an outpatient GI eval next Mon 10/17/2013. I agree with the plan .

## 2013-10-11 NOTE — Progress Notes (Signed)
Patient seen and examined.  Agree with note as stated by Nada BoozerLaura Ingold NP.  Aytpical and typical CP with normal cardiac enzymes and EKG.  Await results of nuclear stress test a echo.

## 2013-10-11 NOTE — Progress Notes (Addendum)
Nuclear stress test showed no ischemia and 2D echo with low normal LVF and no evidence of pericardial effusion.  Still having 4/10 CP and SOB and nausea with some dry heaves.  Will ask GI to see.  Transfer to floor bed.  Will hold on discharge today since still not feeling well.  Will stop ASA and start Protonix 40mg  daily.  Slightly elevated d-dimer.  With SOB will get a chest CT to rule out PE.

## 2013-10-11 NOTE — Progress Notes (Signed)
  Echocardiogram 2D Echocardiogram has been performed.  Arvil ChacoFoster, Jedadiah Abdallah 10/11/2013, 12:01 PM

## 2013-10-12 ENCOUNTER — Other Ambulatory Visit: Payer: Self-pay | Admitting: Physician Assistant

## 2013-10-12 ENCOUNTER — Encounter (HOSPITAL_COMMUNITY): Payer: Self-pay | Admitting: Physician Assistant

## 2013-10-12 DIAGNOSIS — I10 Essential (primary) hypertension: Secondary | ICD-10-CM

## 2013-10-12 MED ORDER — OMEPRAZOLE 20 MG PO CPDR: 20.0000 mg | DELAYED_RELEASE_CAPSULE | Freq: Every day | ORAL | Status: DC

## 2013-10-12 MED ORDER — IRBESARTAN 150 MG PO TABS: 150.0000 mg | ORAL_TABLET | Freq: Every day | ORAL | Status: DC

## 2013-10-12 NOTE — Progress Notes (Signed)
Pt. Seen and examined. Agree with the NP/PA-C note as written.  Workup for ischemia, PE negative. Suspect GI etiology. GI recommends outpatient evaluation. Plan d/c home once arrangements are made.  Chrystie NoseKenneth C. Dior Stepter, MD, [facility] Attending Cardiologist [facility]

## 2013-10-12 NOTE — Care Management Note (Signed)
    Page 1 of 1   10/12/2013     12:40:26 PM   CARE MANAGEMENT NOTE 10/12/2013  Patient:  Monique Aguilar,Monique Aguilar   Account Number:  [id number]  Date Initiated:  10/12/2013  Documentation initiated by:  GRAVES-BIGELOW,Pegah Segel  Subjective/Objective Assessment:   Pt admitted for chest pain.     Action/Plan:   CM received referrals for mammogram and PCP. Both appointments set up and CM to fax Rx over to [facility].   Anticipated DC Date:  10/12/2013   Anticipated DC Plan:  HOME/SELF CARE      DC Planning Services  CM consult  Follow-up appt scheduled      Choice offered to / List presented to:             Status of service:  Completed, signed off Medicare Important Message given?   (If response is "NO", the following Medicare IM given date fields will be blank) Date Medicare IM given:   Date Additional Medicare IM given:    Discharge Disposition:  HOME/SELF CARE  Per UR Regulation:  Reviewed for med. necessity/level of care/duration of stay  If discussed at Long Length of Stay Meetings, dates discussed:    Comments:

## 2013-10-12 NOTE — Discharge Summary (Signed)
Discharge Summary   Patient ID: Monique Aguilar MRN: [id number], DOB/AGE: 1966-03-22 48 y.o. Admit date: 10/10/2013 D/C date:     10/12/2013  Primary Care Provider: No PCP Per Patient Primary Cardiologist: Hilty  Primary Discharge Diagnoses:  1. Atypical chest pain, suspect GI etiology 2. Shortness of breath, may be due to HTN 3. Hypertension 4. Systolic murmur without significant valvular disease on echocardiogram 5. 4mm RML pulmonary nodule on CT scan 6. Axillary lymphadenopathy on CT scan  Secondary Discharge Diagnoses:  1. Chronic anemia 2. Occasional migraines 3. 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 She has no prior cardiac history, but does have a history of chest pain. She was admitted for obs by internal medicine in 12/2012 for chest discomfort. She ruled out for MI and outpatient stress test was recommended but it does not appear that she went for this. She was evaluated in ER in February for CP deemed not cardiac (tender to palpation), ruled out, and sent home. She was shopping on day of admission when she developed substernal chest pain that radiated to her left arm and jaw. It was associated with mild dizziness and SOB. The pain became worse as she walked longer distances. When she stopped to rest during shopping, pain did not resolve. However, when she got home and rested she felt better. Total duration of pain was 1 hour. She went to bed as usual. While getting ready for work the morning of admission, she felt fine. When she got to work she felt dizzy and SOB. She asked the nurse to take her blood pressure which was 159/92. She was sent to the ER for further evaluation where she continued to have persistent CP for several hours. This was unrelieved by SL NTG. She denied palpitations, syncope, LEE, orthopnea, bleeding, recent  surgery/travel/bedrest. The pain was not worse with inspiration or meals. She does have increased discomfort upon palpation. She also reported recent exertional dyspnea. BP at home has been running in the 150s-190s recently. EKG was nonacute and initial troponin was negative. CXR nonacute. She was admitted for further evaluation.   Troponins returned normal. She was placed on irbesartan for new diagnosis of HTN which helped control her BP. She underwent nuclear stress test showing no evidence of ischemia or infarction. Calculated EF was 39% but visually appeared somewhat higher than that. She had 2D echo to clarify which showed EF 50-55%, no RWMA, normal diastolic function, mild LVH. PPI was started. GI saw the patient who recommended to continue PPI and will have her followup in the office. LFTs were normal.   Due to persistence of sx, she went on to have CTA which showed no evidence of PE. She did have 2 other findings noted - 1) 4 mm right middle lobe pulmonary nodule (if at high risk for bronchogenic carcinoma, follow-up chest CT at 1 year, if low risk, no followup is needed), 2) multiple prominent bilateral axillary and right submental lymph node, mammography suggested. She was informed of these findings and asked to follow up with PCP for further discussion. The patient has not had a mammogram recently. She was set up for one tomorrow with the help of care management, who also arranged follow up with a primary care doctor. Will repeat BMET in a 1 week given ARB initiation and have her seen in the  office for one followup, then likely only PRN from then on. Dr. Rennis GoldenHilty has seen and examined the patient today and feels she is stable for discharge.   Discharge Vitals: Blood pressure 110/66, pulse 65, temperature 98.4 F (36.9 C), temperature source Oral, resp. rate 16, height 5\' 6"  (1.676 m), weight 227 lb 4.7 oz (103.1 kg), last menstrual period 10/06/2013, SpO2 99.00%.  Labs: Lab Results  Component  Value Date   WBC 6.5 10/11/2013   HGB 11.2* 10/11/2013   HCT 33.0* 10/11/2013   MCV 81.1 10/11/2013   PLT 326 10/11/2013     Recent Labs Lab 10/10/13 2031 10/11/13 0125  NA  --  135*  K  --  5.1  CL  --  102  CO2  --  20  BUN  --  14  CREATININE  --  0.99  CALCIUM  --  8.8  PROT 7.0  --   BILITOT 0.2*  --   ALKPHOS 67  --   ALT 13  --   AST 16  --   GLUCOSE  --  91    Recent Labs  10/10/13 2031 10/11/13 10/11/13 0607  TROPONINI <0.30 <0.30 <0.30   Lab Results  Component Value Date   CHOL 151 10/11/2013   HDL 48 10/11/2013   LDLCALC 71 10/11/2013   TRIG 162* 10/11/2013   Lab Results  Component Value Date   DDIMER 0.50* 10/10/2013    Diagnostic Studies/Procedures   Dg Chest 2 View  10/10/2013   CLINICAL DATA:  Chest pain and shortness of breath.  EXAM: CHEST  2 VIEW  COMPARISON:  09/05/2013  FINDINGS: The heart size and mediastinal contours are within normal limits. Both lungs are clear. The visualized skeletal structures are unremarkable.  IMPRESSION: No active cardiopulmonary disease.   Electronically Signed   By: Amie Portlandavid  Ormond M.D.   On: 10/10/2013 13:17   Ct Angio Chest Pe W/cm &/or Wo Cm  10/11/2013   CLINICAL DATA Shortness of breath.  Elevated D-dimer.  EXAM CT ANGIOGRAPHY CHEST WITH CONTRAST  TECHNIQUE Multidetector CT imaging of the chest was performed using the standard protocol during bolus administration of intravenous contrast. Multiplanar CT image reconstructions and MIPs were obtained to evaluate the vascular anatomy.  CONTRAST 100mL OMNIPAQUE IOHEXOL 350 MG/ML SOLN  COMPARISON Chest x-ray 10/10/2013.  FINDINGS Thoracic aorta normal caliber. No aneurysm. No dissection. Pulmonary arteries are normal. Heart size normal. No pericardial effusion.  No significant mediastinal adenopathy. Thoracic esophagus normal. Adrenals normal. Splenosis.  Large airways are patent. An ill-defined density is noted in the right middle lobe, image number 70 /series 6. This has a nodular  appearance. This measures approximately 4 mm. No other pulmonary nodules are noted.  Multiple bilateral axillary lymph nodes are present. Largest measures 12.5 mm in the left axilla. A 1.2 cm right submental lymph node is present. Clinical correlation suggested. If patient has not had a recent mammogram, this is also suggested. Thyroid appears normal. No acute bony abnormality identified.  Review of the MIP images confirms the above findings.  IMPRESSION 1. No evidence of pulmonary embolus. 2. Multiple prominent bilateral axillary and right submental lymph nodes. Clinical correlation suggested. If patient has not had a recent mammogram, mammography is suggested. 3. 4 mm right middle lobe pulmonary nodule. If the patient is at high risk for bronchogenic carcinoma, follow-up chest CT at 1 year is recommended. If the patient is at low risk, no follow-up is needed. This recommendation follows the consensus statement: Guidelines  for Management of Small Pulmonary Nodules Detected on CT Scans: A Statement from the Fleischner Society as published in Radiology 2005; 237:395-400.  SIGNATURE  Electronically Signed   By: Maisie Fus  Register   On: 10/11/2013 16:50   Nm Myocar Multi W/spect W/wall Motion / Massie Maroon  10/11/2013   EXAM MYOCARDIAL IMAGING WITH SPECT (REST AND PHARMACOLOGIC-STRESS)  GATED LEFT VENTRICULAR WALL MOTION STUDY  LEFT VENTRICULAR EJECTION FRACTION  TECHNIQUE Standard myocardial SPECT imaging was performed after resting intravenous injection of 10 mCi Tc-23m sestamibi. Subsequently, intravenous infusion of Lexiscan was performed under the supervision of the Cardiology staff. At peak effect of the drug, 30 mCi Tc-82m sestamibi was injected intravenously and standard myocardial SPECT imaging was performed. Quantitative gated imaging was also performed to evaluate left ventricular wall motion, and estimate left ventricular ejection fraction.  COMPARISON None.  FINDINGS The patient underwent stress testing with Lexi  scan protocol under the direction of [facility] staff. The patient tolerated the procedure well with minimal chest discomfort and dyspnea. The electrocardiogram with stress does not show any ischemic changes. The quality of the images is satisfactory. Perfusion imaging shows no evidence of ischemia or scar. The end systolic volume is 80 mL. The end diastolic volume is 49 mL. The calculated ejection fraction is 39% although visually appears somewhat higher than that. There are no segmental wall motion abnormalities.  IMPRESSION No evidence of ischemia or infarction by perfusion imaging. Mildly depressed overall left ventricular systolic function without segmental wall motion abnormalities. Consider evaluation of left ventricular function with echocardiogram to evaluate left ventricular function further.  SIGNATURE  Electronically Signed   By: Cassell Clement M.D.   On: 10/11/2013 13:31    Discharge Medications   Discharge Medication List as of 10/12/2013  2:12 PM    START taking these medications   Details  irbesartan (AVAPRO) 150 MG tablet Take 1 tablet (150 mg total) by mouth daily., Starting 10/12/2013, Until Discontinued, Normal    omeprazole (PRILOSEC) 20 MG capsule Take 1 capsule (20 mg total) by mouth daily., Starting 10/12/2013, Until Discontinued, Normal      CONTINUE these medications which have NOT CHANGED   Details  aspirin-acetaminophen-caffeine (EXCEDRIN MIGRAINE) 250-250-65 MG per tablet Take 1 tablet by mouth every 6 (six) hours as needed for migraine., Until Discontinued, Historical Med        Disposition   The patient will be discharged in stable condition to home. Discharge Orders   Future Appointments Provider Department Dept Phone   10/17/2013 3:00 PM Amy Sydell Axon [facility] [phone]   11/10/2013 9:30 AM [facility] Covering Provider 2 [facility] [phone]   Future Orders Complete By Expires   Diet -  low sodium heart healthy  As directed    Increase activity slowly  As directed    Scheduling Instructions:     You may return to work Monday 10/17/13.     Follow-up Information   Follow up with [facility] On 10/13/2013. ( @ 9:30 for  Diagnostic Bilateral Mammogram)    Specialty:  Diagnostic Radiology   Contact information:   [address] [place] [address] [phone]       Follow up with [facility] On 11/10/2013. (@ 9:30 for Primary Care Provider. )    Contact information:   [address] [place] [address] [phone]      Follow up with [facility]. (Please go to [place] labs on  either 3/18 or 3/19 to have your bloodwork rechecked since you were started on a new blood pressure medicine. It is on the main floor of the building your heart doctor is in Psychologist, occupational building over in [facility]).)    Contact information:   [address] [place], [place] [address] [phone] (Phone) (Fax) 8:00am-5:00pm      Follow up with Ronie Spies PA-C. ([facility] - Our office will call you for a follow-up appointment. Please call the office if you have not heard from [place] within 3 days.)    Contact information:   [address] [place] [address] [phone]      Follow up with Mike Gip, PA-C. (10/17/13 at 3pm - gastroenterology office)    Specialty:  Gastroenterology   Contact information:   [address] [place] [address] [phone]         Duration of Discharge Encounter: Greater than 30 minutes including physician and PA time.  Signed, Ronie Spies PA-C 10/12/2013, 2:44 PM

## 2013-10-12 NOTE — Progress Notes (Signed)
Patient: Monique Aguilar / Admit Date: 10/10/2013 / Date of Encounter: 10/12/2013, 11:11 AM  Subjective  Feels well. No complaints. BP much better controlled. CTA neg for PE but 4mm RML nodule and axillary lymph nodes. No recent mammogram.  Objective   Telemetry: NSR/SA  Physical Exam: Blood pressure 110/66, pulse 65, temperature 98.4 F (36.9 C), temperature source Oral, resp. rate 16, height 5\' 6"  (1.676 m), weight 227 lb 4.7 oz (103.1 kg), last menstrual period 10/06/2013, SpO2 99.00%. General: Well developed, well nourished AAF in no acute distress. Head: Normocephalic, atraumatic, sclera non-icteric, no xanthomas, nares are without discharge. Neck: Negative for carotid bruits. JVP not elevated. Lungs: Clear bilaterally to auscultation without wheezes, rales, or rhonchi. Breathing is unlabored. Heart: RRR S1 S2 without murmurs, rubs, or gallops.  Abdomen: Soft, non-tender, non-distended with normoactive bowel sounds. No rebound/guarding. Extremities: No clubbing or cyanosis. No edema. Distal pedal pulses are 2+ and equal bilaterally. Neuro: Alert and oriented X 3. Moves all extremities spontaneously. Psych:  Responds to questions appropriately with a normal affect.   Intake/Output Summary (Last 24 hours) at 10/12/13 1111 Last data filed at 10/11/13 1300  Gross per 24 hour  Intake    360 ml  Output      0 ml  Net    360 ml    Inpatient Medications:  . heparin  5,000 Units Subcutaneous 3 times per day  . irbesartan  150 mg Oral Daily  . pantoprazole  40 mg Oral Q0600  . sodium chloride  3 mL Intravenous Q12H   Infusions:    Labs:  Recent Labs  10/10/13 1236 10/11/13 0125  NA 138 135*  K 4.3 5.1  CL 103 102  CO2 23 20  GLUCOSE 87 91  BUN 10 14  CREATININE 1.02 0.99  CALCIUM 9.2 8.8    Recent Labs  10/10/13 2031  AST 16  ALT 13  ALKPHOS 67  BILITOT 0.2*  PROT 7.0  ALBUMIN 3.3*    Recent Labs  10/10/13 1236 10/11/13 0125  WBC 5.6 6.5  NEUTROABS 2.5   --   HGB 11.6* 11.2*  HCT 34.5* 33.0*  MCV 80.6 81.1  PLT 298 326    Recent Labs  10/10/13 2031 10/11/13 10/11/13 0607  TROPONINI <0.30 <0.30 <0.30   No components found with this basename: POCBNP,  No results found for this basename: HGBA1C,  in the last 72 hours   Radiology/Studies:  Dg Chest 2 View  10/10/2013   CLINICAL DATA:  Chest pain and shortness of breath.  EXAM: CHEST  2 VIEW  COMPARISON:  09/05/2013  FINDINGS: The heart size and mediastinal contours are within normal limits. Both lungs are clear. The visualized skeletal structures are unremarkable.  IMPRESSION: No active cardiopulmonary disease.   Electronically Signed   By: Amie Portland M.D.   On: 10/10/2013 13:17   Ct Angio Chest Pe W/cm &/or Wo Cm  10/11/2013   CLINICAL DATA Shortness of breath.  Elevated D-dimer.  EXAM CT ANGIOGRAPHY CHEST WITH CONTRAST  TECHNIQUE Multidetector CT imaging of the chest was performed using the standard protocol during bolus administration of intravenous contrast. Multiplanar CT image reconstructions and MIPs were obtained to evaluate the vascular anatomy.  CONTRAST OMNIPAQUE IOHEXOL 350 MG/ML SOLN  COMPARISON Chest x-ray 10/10/2013.  FINDINGS Thoracic aorta normal caliber. No aneurysm. No dissection. Pulmonary arteries are normal. Heart size normal. No pericardial effusion.  No significant mediastinal adenopathy. Thoracic esophagus normal. Adrenals normal. Splenosis.  Large airways are  patent. An ill-defined density is noted in the right middle lobe, image number 70 /series 6. This has a nodular appearance. This measures approximately 4 mm. No other pulmonary nodules are noted.  Multiple bilateral axillary lymph nodes are present. Largest measures 12.5 mm in the left axilla. A 1.2 cm right submental lymph node is present. Clinical correlation suggested. If patient has not had a recent mammogram, this is also suggested. Thyroid appears normal. No acute bony abnormality identified.  Review of  the MIP images confirms the above findings.  IMPRESSION 1. No evidence of pulmonary embolus. 2. Multiple prominent bilateral axillary and right submental lymph nodes. Clinical correlation suggested. If patient has not had a recent mammogram, mammography is suggested. 3. 4 mm right middle lobe pulmonary nodule. If the patient is at high risk for bronchogenic carcinoma, follow-up chest CT at 1 year is recommended. If the patient is at low risk, no follow-up is needed. This recommendation follows the consensus statement: Guidelines for Management of Small Pulmonary Nodules Detected on CT Scans: A Statement from the Fleischner Society as published in Radiology 2005; 237:395-400.  SIGNATURE  Electronically Signed   By: Maisie Fushomas  Register   On: 10/11/2013 16:50   Nm Myocar Multi W/spect W/wall Motion / Massie MaroonEf  10/11/2013   EXAM MYOCARDIAL IMAGING WITH SPECT (REST AND PHARMACOLOGIC-STRESS)  GATED LEFT VENTRICULAR WALL MOTION STUDY  LEFT VENTRICULAR EJECTION FRACTION  TECHNIQUE Standard myocardial SPECT imaging was performed after resting intravenous injection of 10 mCi Tc-694m sestamibi. Subsequently, intravenous infusion of Lexiscan was performed under the supervision of the Cardiology staff. At peak effect of the drug, 30 mCi Tc-534m sestamibi was injected intravenously and standard myocardial SPECT imaging was performed. Quantitative gated imaging was also performed to evaluate left ventricular wall motion, and estimate left ventricular ejection fraction.  COMPARISON None.  FINDINGS The patient underwent stress testing with Lexi scan protocol under the direction of [facility] staff. The patient tolerated the procedure well with minimal chest discomfort and dyspnea. The electrocardiogram with stress does not show any ischemic changes. The quality of the images is satisfactory. Perfusion imaging shows no evidence of ischemia or scar. The end systolic volume is 80 mL. The end diastolic volume is 49 mL. The calculated  ejection fraction is 39% although visually appears somewhat higher than that. There are no segmental wall motion abnormalities.  IMPRESSION No evidence of ischemia or infarction by perfusion imaging. Mildly depressed overall left ventricular systolic function without segmental wall motion abnormalities. Consider evaluation of left ventricular function with echocardiogram to evaluate left ventricular function further.  SIGNATURE  Electronically Signed   By: Cassell Clementhomas  Brackbill M.D.   On: 10/11/2013 13:31     Assessment and Plan  1. Chest pain/dyspnea on exertion - echo with mild LVH, EF 50-55%, normal diastolic parameters. CTA without evidence of PE. Suspect atypical CP, possibly GI in nature - PPI started, has f/u with GI as outpatient. Dyspnea may be related to deconditioning or elevated BP which is now controlled. No arrhythmia on tele. 2. Soft systolic murmur - no significant valvular dz on echo. 3. HTN - well controlled on new ARB. 4. Abnormalities on chest CT - 4mm RML pulm nodule - may not require f/u as she is not high risk for bronchogenic carcinoma. However she does have "Multiple prominent bilateral axillary and right submental lymph nodes. " - radiology recommending mammogram. Spoke with care management who will assist in setting up a PCP appt and [facility].   DC today  if OK with MD once PCP arrangements are made.  Signed, Ronie Spies PA-C

## 2013-10-17 ENCOUNTER — Ambulatory Visit: Payer: Self-pay | Admitting: Physician Assistant

## 2013-10-20 ENCOUNTER — Ambulatory Visit: Payer: Self-pay | Admitting: Nurse Practitioner

## 2013-10-25 ENCOUNTER — Telehealth: Payer: Self-pay | Admitting: *Deleted

## 2013-10-25 NOTE — Telephone Encounter (Signed)
Faxed to [facility] mammography - low risk for breast biopsy

## 2013-10-26 ENCOUNTER — Other Ambulatory Visit: Payer: Self-pay | Admitting: Radiology

## 2013-10-26 ENCOUNTER — Ambulatory Visit: Payer: 59 | Admitting: Physician Assistant

## 2013-11-10 ENCOUNTER — Encounter: Payer: Self-pay | Admitting: Internal Medicine

## 2013-11-10 ENCOUNTER — Ambulatory Visit: Payer: 59 | Attending: Internal Medicine | Admitting: Internal Medicine

## 2013-11-10 VITALS — BP 134/82 | HR 78 | Temp 98.3°F | Resp 16 | Ht 67.0 in | Wt 234.0 lb

## 2013-11-10 DIAGNOSIS — I10 Essential (primary) hypertension: Secondary | ICD-10-CM | POA: Insufficient documentation

## 2013-11-10 MED ORDER — IRBESARTAN 150 MG PO TABS: 150.0000 mg | ORAL_TABLET | Freq: Every day | ORAL | Status: DC

## 2013-11-10 NOTE — Patient Instructions (Signed)

## 2013-11-10 NOTE — Progress Notes (Signed)
Patient ID: Monique Aguilar, female   DOB: September 16, 1965, 48 y.o.   MRN: [id number]   CC: hospital follow up  HPI: Pt is 48 yo female who presents to clinic for hospital follow up. Feels well this AM, no concerns, no chest pain or shortness of breath, no abdominal or urinary concerns.   No Known Allergies Past Medical History  Diagnosis Date  . Microcytic anemia     a. No prior workup.  . Migraines   . Low back pain   . Atypical chest pain     a. 10/2013: suspected GI etiology. Nuc - nonischemic, EF 39% but visually higher - f/u echo to clarify showed EF 50-55% with only mild LVH, no significant valvuar disease.  [name] HTN (hypertension)   . Abnormal CT of the chest     a. 10/2013: 4mm RML pulm nodule, axillary lymphadenopathy. Instructed to f/u PCP.   Current Outpatient Prescriptions on File Prior to Visit  Medication Sig Dispense Refill  . aspirin-acetaminophen-caffeine (EXCEDRIN MIGRAINE) 250-250-65 MG per tablet Take 1 tablet by mouth every 6 (six) hours as needed for migraine.      [name] omeprazole (PRILOSEC) 20 MG capsule Take 1 capsule (20 mg total) by mouth daily.  30 capsule  1   No current facility-administered medications on file prior to visit.   Family History  Problem Relation Age of Onset  . Heart disease Father     Dx early 26's (? heart valve), died of MI at 85  . Congestive Heart Failure Mother     Died in her 24s - CHF   . Other      Siblings - No CAD   History   Social History  . Marital Status: Single    Spouse Name: N/A    Number of Children: N/A  . Years of Education: N/A   Occupational History  . Not on file.   Social History Main Topics  . Smoking status: Never Smoker   . Smokeless tobacco: Never Used  . Alcohol Use: No  . Drug Use: No  . Sexual Activity: Not Currently    Birth Control/ Protection: Surgical   Other Topics Concern  . Not on file   Social History Narrative  . No narrative on file    Review of Systems  Constitutional: Negative for  fever, chills, diaphoresis, activity change, appetite change and fatigue.  HENT: Negative for ear pain, nosebleeds, congestion, facial swelling, rhinorrhea, neck pain, neck stiffness and ear discharge.   Eyes: Negative for pain, discharge, redness, itching and visual disturbance.  Respiratory: Negative for cough, choking, chest tightness, shortness of breath, wheezing and stridor.   Cardiovascular: Negative for chest pain, palpitations and leg swelling.  Gastrointestinal: Negative for abdominal distention.  Genitourinary: Negative for dysuria, urgency, frequency, hematuria, flank pain, decreased urine volume, difficulty urinating and dyspareunia.  Musculoskeletal: Negative for back pain, joint swelling, arthralgias and gait problem.  Neurological: Negative for dizziness, tremors, seizures, syncope, facial asymmetry, speech difficulty, weakness, light-headedness, numbness and headaches.  Hematological: Negative for adenopathy. Does not bruise/bleed easily.  Psychiatric/Behavioral: Negative for hallucinations, behavioral problems, confusion, dysphoric mood, decreased concentration and agitation.    Objective:   Filed Vitals:   11/10/13 0939  BP: 134/82  Pulse: 78  Temp: 98.3 F (36.8 C)  Resp: 16    Physical Exam  Constitutional: Appears well-developed and well-nourished. No distress.  CVS: RRR, S1/S2 +, no murmurs, no gallops, no carotid bruit.  Pulmonary: Effort and breath sounds normal, no stridor,  rhonchi, wheezes, rales.  Abdominal: Soft. BS +,  no distension, tenderness, rebound or guarding.    Lab Results  Component Value Date   WBC 6.5 10/11/2013   HGB 11.2* 10/11/2013   HCT 33.0* 10/11/2013   MCV 81.1 10/11/2013   PLT 326 10/11/2013   Lab Results  Component Value Date   CREATININE 0.99 10/11/2013   BUN 14 10/11/2013   NA 135* 10/11/2013   K 5.1 10/11/2013   CL 102 10/11/2013   CO2 20 10/11/2013    Lab Results  Component Value Date   HGBA1C 5.2 12/16/2012   Lipid Panel      Component Value Date/Time   CHOL 151 10/11/2013 0125   TRIG 162* 10/11/2013 0125   HDL 48 10/11/2013 0125   CHOLHDL 3.1 10/11/2013 0125   VLDL 32 10/11/2013 0125   LDLCALC 71 10/11/2013 0125       Assessment and plan:   Patient Active Problem List   Diagnosis Date Noted  . HTN (hypertension)- appears to be well controlled, continue Avapro and provide refill today, come back in 3 months recommended for blood work, BMP to ensure that kidney function and electrolytes are stable  10/11/2013

## 2013-11-10 NOTE — Progress Notes (Signed)
Pt here HFU of CP abd HTN Taking prescribed medication daily Denies cp today vss

## 2013-11-15 ENCOUNTER — Encounter: Payer: Self-pay | Admitting: Internal Medicine

## 2014-02-09 ENCOUNTER — Ambulatory Visit: Payer: 59 | Admitting: Internal Medicine

## 2014-02-15 ENCOUNTER — Encounter: Payer: Self-pay | Admitting: Internal Medicine

## 2014-03-09 ENCOUNTER — Emergency Department (HOSPITAL_COMMUNITY): Payer: Self-pay

## 2014-03-09 ENCOUNTER — Encounter (HOSPITAL_COMMUNITY): Payer: Self-pay | Admitting: Emergency Medicine

## 2014-03-09 ENCOUNTER — Emergency Department (HOSPITAL_COMMUNITY): Payer: 59

## 2014-03-09 ENCOUNTER — Emergency Department (HOSPITAL_COMMUNITY)
Admission: EM | Admit: 2014-03-09 | Discharge: 2014-03-10 | Disposition: A | Discharge status: Home / Self Care | Payer: Self-pay | Attending: Emergency Medicine | Admitting: Emergency Medicine

## 2014-03-09 DIAGNOSIS — M7989 Other specified soft tissue disorders: Secondary | ICD-10-CM | POA: Insufficient documentation

## 2014-03-09 DIAGNOSIS — R0789 Other chest pain: Secondary | ICD-10-CM | POA: Insufficient documentation

## 2014-03-09 DIAGNOSIS — Z7982 Long term (current) use of aspirin: Secondary | ICD-10-CM | POA: Insufficient documentation

## 2014-03-09 DIAGNOSIS — G43909 Migraine, unspecified, not intractable, without status migrainosus: Secondary | ICD-10-CM | POA: Insufficient documentation

## 2014-03-09 DIAGNOSIS — R079 Chest pain, unspecified: Secondary | ICD-10-CM | POA: Insufficient documentation

## 2014-03-09 DIAGNOSIS — I10 Essential (primary) hypertension: Secondary | ICD-10-CM | POA: Insufficient documentation

## 2014-03-09 DIAGNOSIS — Z79899 Other long term (current) drug therapy: Secondary | ICD-10-CM | POA: Insufficient documentation

## 2014-03-09 DIAGNOSIS — J40 Bronchitis, not specified as acute or chronic: Secondary | ICD-10-CM | POA: Insufficient documentation

## 2014-03-09 DIAGNOSIS — Z862 Personal history of diseases of the blood and blood-forming organs and certain disorders involving the immune mechanism: Secondary | ICD-10-CM | POA: Insufficient documentation

## 2014-03-09 LAB — I-STAT TROPONIN, ED
TROPONIN I, POC: 0 ng/mL (ref 0.00–0.08)
Troponin i, poc: 0 ng/mL (ref 0.00–0.08)

## 2014-03-09 LAB — CBC
HEMATOCRIT: 32.6 % — AB (ref 36.0–46.0)
HEMOGLOBIN: 10.6 g/dL — AB (ref 12.0–15.0)
MCH: 26.6 pg (ref 26.0–34.0)
MCHC: 32.5 g/dL (ref 30.0–36.0)
MCV: 81.9 fL (ref 78.0–100.0)
Platelets: 274 10*3/uL (ref 150–400)
RBC: 3.98 MIL/uL (ref 3.87–5.11)
RDW: 13.6 % (ref 11.5–15.5)
WBC: 6 10*3/uL (ref 4.0–10.5)

## 2014-03-09 LAB — BASIC METABOLIC PANEL
Anion gap: 12 (ref 5–15)
BUN: 12 mg/dL (ref 6–23)
CO2: 22 meq/L (ref 19–32)
Calcium: 8.8 mg/dL (ref 8.4–10.5)
Chloride: 105 mEq/L (ref 96–112)
Creatinine, Ser: 1.11 mg/dL — ABNORMAL HIGH (ref 0.50–1.10)
GFR calc Af Amer: 67 mL/min — ABNORMAL LOW (ref >90)
GFR calc non Af Amer: 58 mL/min — ABNORMAL LOW (ref >90)
GLUCOSE: 79 mg/dL (ref 70–99)
POTASSIUM: 4.4 meq/L (ref 3.7–5.3)
Sodium: 139 mEq/L (ref 137–147)

## 2014-03-09 LAB — PRO B NATRIURETIC PEPTIDE: PRO B NATRI PEPTIDE: 152.1 pg/mL — AB (ref 0–125)

## 2014-03-09 LAB — D-DIMER, QUANTITATIVE (NOT AT ARMC): D-Dimer, Quant: 0.51 ug/mL-FEU — ABNORMAL HIGH (ref 0.00–0.48)

## 2014-03-09 MED ORDER — IOHEXOL 350 MG/ML SOLN
100.0000 mL | Freq: Once | INTRAVENOUS | Status: AC | PRN
  Administered 2014-03-09: 100 mL via INTRAVENOUS

## 2014-03-09 MED ORDER — KETOROLAC TROMETHAMINE 60 MG/2ML IM SOLN
60.0000 mg | Freq: Once | INTRAMUSCULAR | Status: AC
  Administered 2014-03-09: 60 mg via INTRAMUSCULAR
  Filled 2014-03-09: qty 2

## 2014-03-09 MED ORDER — ALBUTEROL SULFATE HFA 108 (90 BASE) MCG/ACT IN AERS
2.0000 | INHALATION_SPRAY | Freq: Once | RESPIRATORY_TRACT | Status: AC
  Administered 2014-03-10: 2 via RESPIRATORY_TRACT
  Filled 2014-03-09: qty 6.7

## 2014-03-09 NOTE — ED Notes (Signed)
Pt states that her heartrate was 137 when the nurse at her work checked it today. BP was reported as  elevated as well

## 2014-03-09 NOTE — ED Notes (Signed)
Phlebotomy at the bedside  

## 2014-03-09 NOTE — ED Notes (Signed)
MArissa, PA made aware of the patient's concerns about her BP and HR.

## 2014-03-09 NOTE — ED Notes (Signed)
Pt states that she has had constant central chest pain for 3 days. States that she was at work walking when she noticed it. Pt states that pain is worse with movement and deep breathing. States that pain will occasionally shoot into her left arm.

## 2014-03-09 NOTE — ED Notes (Signed)
PA at the bedside.

## 2014-03-09 NOTE — ED Provider Notes (Signed)
CSN: [id number]     Arrival date & time 03/09/14  1601 History   First MD Initiated Contact with Patient 03/09/14 1838     Chief Complaint  Patient presents with  . Chest Pain     (Consider location/radiation/quality/duration/timing/severity/associated sxs/prior Treatment) HPI Monique Aguilar is a 48 y.o. female who presents to emergency department complaining of chest pain. 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 States it has been constant since then. It does not radiate. Describes as sharp, and in the center of her chest worsened with deep breathing. She states they measured her vital signs at work today, and she was told her blood pressure was high, states it was 126/101, and states her heart rate at that time was 137. Patient was also told by nurses at work that she was retaining fluid in her legs. She was told she probably need to be checked by an emergency department. Patient denies any nausea, vomiting, abdominal pain, dizziness, pain in her back. She denies any recent illnesses. She denies any cough. She denies fever chills. She denies any recent travel or surgeries. She's not a smoker. No history of clots. Reports history of hypertension, otherwise no medical problems. Patient did not take anything for her pain. Past Medical History  Diagnosis Date  . Microcytic anemia     a. No prior workup.  . Migraines   . Low back pain   . Atypical chest pain     a. 10/2013: suspected GI etiology. Nuc - nonischemic, EF 39% but visually higher - f/u echo to clarify showed EF 50-55% with only mild LVH, no significant valvuar disease.  [name]. HTN (hypertension)   . Abnormal CT of the chest     a. 10/2013: 4mm RML pulm nodule, axillary lymphadenopathy. Instructed to f/u PCP.   Past Surgical History  Procedure Laterality Date  . Tonsillectomy  1992?  [name]. Cesarean section  1988  . Tubal ligation  1995   Family History  Problem Relation Age  of Onset  . Heart disease Father     Dx early 7140's (? heart valve), died of MI at 8471  . Congestive Heart Failure Mother     Died in her 1770s - CHF   . Other      Siblings - No CAD   History  Substance Use Topics  . Smoking status: Never Smoker   . Smokeless tobacco: Never Used  . Alcohol Use: No   OB History   Grav Para Term Preterm Abortions TAB SAB Ect Mult Living                 Review of Systems  Constitutional: Positive for fatigue. Negative for fever and chills.  Respiratory: Positive for chest tightness and shortness of breath. Negative for cough.   Cardiovascular: Positive for chest pain and leg swelling. Negative for palpitations.  Gastrointestinal: Negative for nausea, vomiting, abdominal pain and diarrhea.  Genitourinary: Negative for dysuria, flank pain, vaginal bleeding, vaginal discharge, vaginal pain and pelvic pain.  Musculoskeletal: Negative for arthralgias, myalgias, neck pain and neck stiffness.  Skin: Negative for rash.  Neurological: Negative for dizziness, weakness and headaches.  All other systems reviewed and are negative.     Allergies  Review of patient's allergies indicates no known allergies.  Home Medications   Prior to Admission medications   Medication Sig Start Date End Date Taking? Authorizing Provider  aspirin-acetaminophen-caffeine (EXCEDRIN MIGRAINE) [phone] MG  per tablet Take 1 tablet by mouth every 6 (six) hours as needed for migraine.    Historical Provider, MD  irbesartan (AVAPRO) 150 MG tablet Take 1 tablet (150 mg total) by mouth daily. 11/10/13   Dorothea Ogle, MD  omeprazole (PRILOSEC) 20 MG capsule Take 1 capsule (20 mg total) by mouth daily. 10/12/13   Dayna N Dunn, PA-C   BP 139/76  Pulse 87  Temp(Src) 98.9 F (37.2 C) (Oral)  Resp 18  Ht 5\' 6"  (1.676 m)  SpO2 99%  LMP 02/06/2014 Physical Exam  Nursing note and vitals reviewed. Constitutional: She is oriented to person, place, and time. She appears well-developed and  well-nourished. No distress.  HENT:  Head: Normocephalic.  Eyes: Conjunctivae are normal.  Neck: Neck supple.  Cardiovascular: Normal rate, regular rhythm and normal heart sounds.   Pulmonary/Chest: Effort normal and breath sounds normal. No respiratory distress. She has no wheezes. She has no rales. She exhibits tenderness.  Tenderness along the sternum.  Abdominal: Soft. Bowel sounds are normal. She exhibits no distension. There is no tenderness. There is no rebound.  Musculoskeletal:  non pitting lower extremity edema bilaterally, dorsal pedal pulses intact bilaterally  Neurological: She is alert and oriented to person, place, and time.  Skin: Skin is warm and dry.  Psychiatric: She has a normal mood and affect. Her behavior is normal.    ED Course  Procedures (including critical care time) Labs Review Labs Reviewed  CBC - Abnormal; Notable for the following:    Hemoglobin 10.6 (*)    HCT 32.6 (*)    All other components within normal limits  BASIC METABOLIC PANEL - Abnormal; Notable for the following:    Creatinine, Ser 1.11 (*)    GFR calc non Af Amer 58 (*)    GFR calc Af Amer 67 (*)    All other components within normal limits  D-DIMER, QUANTITATIVE  PRO B NATRIURETIC PEPTIDE  I-STAT TROPOININ, ED    Imaging Review Dg Chest 2 View  03/09/2014   CLINICAL DATA:  Chest pain  EXAM: CHEST  2 VIEW  COMPARISON:  3/10/ 2015  FINDINGS: The heart size and mediastinal contours are within normal limits. Both lungs are clear. The visualized skeletal structures are unremarkable.  IMPRESSION: No active cardiopulmonary disease.   Electronically Signed   By: Alcide Clever M.D.   On: 03/09/2014 16:44     EKG Interpretation None      MDM   Final diagnoses:  None    Patient with atypical chest pain, recent workup in March of this past year, including admission by cardiology, and Myoview stress test which was negative. At that time her chest pain was thought to be GI related, she  was supposed to followup with gastroenterologist. Patient states she cannot remember if she followed up. She states however she has not had any issues since then. He should reports elevated blood pressure and heart rate at work, currently her heart rate is in the 80s, blood pressure 139/76. EKG appears to be normal for possible LVH. Will check labs, chest x-ray, will add d-dimer given pleuritic component in constant pain with shortness of breath, will get a BNP given lower extremity swelling, shortness of breath, chest pain.    8:20 PM D dimer elevated. Will get CT angio. If negative, pt ok to be dc home. Most likely musculoskeletal pain. Follow up with PCP.   Lottie Mussel, PA-C 03/13/14 1559

## 2014-03-09 NOTE — ED Provider Notes (Signed)
Discussed case with Jaynie Crumble, PA-C. Transfer of care from [facility], PA-C at change of shift.   Monique Aguilar is a 48 year old female with past medical history of migraines, atypical chest pain presenting to the ED with chest pain. Reported that the chest has been ongoing for the past 3 days. Patient had cardiac workup performed in March 2015 with negative findings.  Plan: CT angiogram of chest. If negative patient can be discharged home with outpatient followup.  Results for orders placed during the hospital encounter of 03/09/14  CBC      Result Value Ref Range   WBC 6.0  4.0 - 10.5 K/uL   RBC 3.98  3.87 - 5.11 MIL/uL   Hemoglobin 10.6 (*) 12.0 - 15.0 g/dL   HCT 16.1 (*) 09.6 - 04.5 %   MCV 81.9  78.0 - 100.0 fL   MCH 26.6  26.0 - 34.0 pg   MCHC 32.5  30.0 - 36.0 g/dL   RDW 40.9  81.1 - 91.4 %   Platelets 274  150 - 400 K/uL  BASIC METABOLIC PANEL      Result Value Ref Range   Sodium 139  137 - 147 mEq/L   Potassium 4.4  3.7 - 5.3 mEq/L   Chloride 105  96 - 112 mEq/L   CO2 22  19 - 32 mEq/L   Glucose, Bld 79  70 - 99 mg/dL   BUN 12  6 - 23 mg/dL   Creatinine, Ser 7.82 (*) 0.50 - 1.10 mg/dL   Calcium 8.8  8.4 - 95.6 mg/dL   GFR calc non Af Amer 58 (*) >90 mL/min   GFR calc Af Amer 67 (*) >90 mL/min   Anion gap 12  5 - 15  D-DIMER, QUANTITATIVE      Result Value Ref Range   D-Dimer, Quant 0.51 (*) 0.00 - 0.48 ug/mL-FEU  PRO B NATRIURETIC PEPTIDE      Result Value Ref Range   Pro B Natriuretic peptide (BNP) 152.1 (*) 0 - 125 pg/mL  I-STAT TROPOININ, ED      Result Value Ref Range   Troponin i, poc 0.00  0.00 - 0.08 ng/mL   Comment 3             Dg Chest 2 View  03/09/2014   CLINICAL DATA:  Chest pain  EXAM: CHEST  2 VIEW  COMPARISON:  3/10/ 2015  FINDINGS: The heart size and mediastinal contours are within normal limits. Both lungs are clear. The visualized skeletal structures are unremarkable.  IMPRESSION: No active cardiopulmonary disease.    Electronically Signed   By: Alcide Clever M.D.   On: 03/09/2014 16:44   Ct Angio Chest W/cm &/or Wo Cm  03/09/2014   CLINICAL DATA:  Mid chest pain, shortness of breath on exertion.  EXAM: CT ANGIOGRAPHY CHEST WITH CONTRAST  TECHNIQUE: Multidetector CT imaging of the chest was performed using the standard protocol during bolus administration of intravenous contrast. Multiplanar CT image reconstructions and MIPs were obtained to evaluate the vascular anatomy.  CONTRAST:  OMNIPAQUE IOHEXOL 350 MG/ML SOLN  COMPARISON:  Chest radiograph March 09, 2014 and CT angiogram of the chest October 11, 2013  FINDINGS: Adequate contrast opacification of the pulmonary artery's. Main pulmonary artery is not enlarged. No pulmonary arterial filling defects to the level of the subsegmental branches.  Heart and pericardium are unremarkable, no right heart strain. Thoracic aorta is normal course and caliber, unremarkable. 15 mm right hilar lymph node,  12 mm short access left axillary lymph nodes, 10 mm aortopulmonary window lymph node are unchanged. Tracheobronchial tree is patent, no pneumothorax. Diffuse mild bronchial wall thickening. 3 mm right middle lobe sub solid pulmonary nodule, below size surveillance recommendations. No pleural effusions, focal consolidations, pulmonary masses.  Included view of the abdomen is unremarkable. Visualized soft tissues and included osseous structures appear normal.  Review of the MIP images confirms the above findings.  IMPRESSION: No acute pulmonary embolism.  Mild bronchial wall thickening may reflect bronchitis/ reactive airway disease without focal consolidation.  Similar lymphadenopathy, prior recommendation for mammogram and clinical correlation stands.   Electronically Signed   By: Awilda Metroourtnay  Bloomer   On: 03/09/2014 22:21   CBC negative findings. BMP unremarkable - mild elevation in creatinine of 1.11. Troponin negative elevation. BNP mildly elevated at 152.1. D-dimer elevated at  0.51. Chest x-ray unremarkable. CT angiogram of chest negative for acute pulmonary embolism. Mild bronchial wall thickening may reflect bronchitis or reactive airway disease without focal consolidation.  11:54 PM Patient seen and assessed by this provider. Patient sitting comfortably in bed with no signs of respiratory distress. Heart rate and rhythm normal. Lungs clear to auscultation to upper and lower lobes bilaterally. Good lung expansion. Radial and DP pulses 2+ bilaterally. Cap refill less than 3 seconds. Negative swelling or pitting edema identified to the lower extremities bilaterally. Full range of motion to upper and lower tremors bilaterally without difficulty noted. Negative focal neurological deficits. GCS 15. Alert and oriented x3. Discussed labs and plan for discharge in great detail. Patient understood and agreed to plan of care.  Doubt PE. Doubt ACS. Doubt CHF exacerbation. Doubt pneumonia. Negative findings of fluid overload. Suspicion to be bronchitis as noted on CT angiogram of chest. Patient stable, afebrile. Patient not septic appearing. Negative hypoxia. Discharged patient. Discharge patient with Z-Pak and albuterol. Referred to PCP. Discussed with patient to rest and stay hydrated. Discussed with patient to closely monitor symptoms and if symptoms are to worsen or change to report back to the ED - strict return instructions given.  Patient agreed to plan of care, understood, all questions answered.   Raymon MuttonMarissa Covey Baller, PA-C 03/10/14 1909

## 2014-03-09 NOTE — ED Notes (Signed)
Patient returned from CT. Placed back on the monitor.  

## 2014-03-10 MED ORDER — AZITHROMYCIN 250 MG PO TABS: 250.0000 mg | ORAL_TABLET | Freq: Every day | ORAL | Status: DC

## 2014-03-10 NOTE — Discharge Instructions (Signed)
Please call your doctor for a followup appointment within 24-48 hours. When you talk to your doctor please let them know that you were seen in the emergency department and have them acquire all of your records so that they can discuss the findings with you and formulate a treatment plan to fully care for your new and ongoing problems. Please call and set-up an appointment with your primary care provider to be seen and re-assessed  Please rest and stay hydrated  Please take medications as prescribed Please use albuterol as needed for shortness of breath Please continue to monitor symptoms closely and if symptoms are to worsen or change (fever greater than 101, chills, sweating, nausea, vomiting, chest pain, shortness of breath, difficulty breathing, worsening or changes to pain pattern, dizziness, weakness, numbness, tingling) please report back to the ED immediately    Chest Pain (Nonspecific) It is often hard to give a specific diagnosis for the cause of chest pain. There is always a chance that your pain could be related to something serious, such as a heart attack or a blood clot in the lungs. You need to follow up with your health care provider for further evaluation. CAUSES   Heartburn.  Pneumonia or bronchitis.  Anxiety or stress.  Inflammation around your heart (pericarditis) or lung (pleuritis or pleurisy).  A blood clot in the lung.  A collapsed lung (pneumothorax). It can develop suddenly on its own (spontaneous pneumothorax) or from trauma to the chest.  Shingles infection (herpes zoster virus). The chest wall is composed of bones, muscles, and cartilage. Any of these can be the source of the pain.  The bones can be bruised by injury.  The muscles or cartilage can be strained by coughing or overwork.  The cartilage can be affected by inflammation and become sore (costochondritis). DIAGNOSIS  Lab tests or other studies may be needed to find the cause of your pain. Your  health care provider may have you take a test called an ambulatory electrocardiogram (ECG). An ECG records your heartbeat patterns over a 24-hour period. You may also have other tests, such as:  Transthoracic echocardiogram (TTE). During echocardiography, sound waves are used to evaluate how blood flows through your heart.  Transesophageal echocardiogram (TEE).  Cardiac monitoring. This allows your health care provider to monitor your heart rate and rhythm in real time.  Holter monitor. This is a portable device that records your heartbeat and can help diagnose heart arrhythmias. It allows your health care provider to track your heart activity for several days, if needed.  Stress tests by exercise or by giving medicine that makes the heart beat faster. TREATMENT   Treatment depends on what may be causing your chest pain. Treatment may include:  Acid blockers for heartburn.  Anti-inflammatory medicine.  Pain medicine for inflammatory conditions.  Antibiotics if an infection is present.  You may be advised to change lifestyle habits. This includes stopping smoking and avoiding alcohol, caffeine, and chocolate.  You may be advised to keep your head raised (elevated) when sleeping. This reduces the chance of acid going backward from your stomach into your esophagus. Most of the time, nonspecific chest pain will improve within 2-3 days with rest and mild pain medicine.  HOME CARE INSTRUCTIONS   If antibiotics were prescribed, take them as directed. Finish them even if you start to feel better.  For the next few days, avoid physical activities that bring on chest pain. Continue physical activities as directed.  Do not use any  tobacco products, including cigarettes, chewing tobacco, or electronic cigarettes.  Avoid drinking alcohol.  Only take medicine as directed by your health care provider.  Follow your health care provider's suggestions for further testing if your chest pain does  not go away.  Keep any follow-up appointments you made. If you do not go to an appointment, you could develop lasting (chronic) problems with pain. If there is any problem keeping an appointment, call to reschedule. SEEK MEDICAL CARE IF:   Your chest pain does not go away, even after treatment.  You have a rash with blisters on your chest.  You have a fever. SEEK IMMEDIATE MEDICAL CARE IF:   You have increased chest pain or pain that spreads to your arm, neck, jaw, back, or abdomen.  You have shortness of breath.  You have an increasing cough, or you cough up blood.  You have severe back or abdominal pain.  You feel nauseous or vomit.  You have severe weakness.  You faint.  You have chills. This is an emergency. Do not wait to see if the pain will go away. Get medical help at once. Call your local emergency services (911 in [place] Do not drive yourself to the hospital. MAKE SURE YOU:   Understand these instructions.  Will watch your condition.  Will get help right away if you are not doing well or get worse. Document Released: 04/30/2005 Document Revised: 07/26/2013 Document Reviewed: 02/24/2008 [facility] Patient Information 2015 [place], [place]. This information is not intended to replace advice given to you by your health care provider. Make sure you discuss any questions you have with your health care provider. Acute Bronchitis Bronchitis is inflammation of the airways that extend from the windpipe into the lungs (bronchi). The inflammation often causes mucus to develop. This leads to a cough, which is the most common symptom of bronchitis.  In acute bronchitis, the condition usually develops suddenly and goes away over time, usually in a couple weeks. Smoking, allergies, and asthma can make bronchitis worse. Repeated episodes of bronchitis may cause further lung problems.  CAUSES Acute bronchitis is most often caused by the same virus that causes a cold. The virus can  spread from person to person (contagious) through coughing, sneezing, and touching contaminated objects. SIGNS AND SYMPTOMS   Cough.   Fever.   Coughing up mucus.   Body aches.   Chest congestion.   Chills.   Shortness of breath.   Sore throat.  DIAGNOSIS  Acute bronchitis is usually diagnosed through a physical exam. Your health care provider will also ask you questions about your medical history. Tests, such as chest X-rays, are sometimes done to rule out other conditions.  TREATMENT  Acute bronchitis usually goes away in a couple weeks. Oftentimes, no medical treatment is necessary. Medicines are sometimes given for relief of fever or cough. Antibiotic medicines are usually not needed but may be prescribed in certain situations. In some cases, an inhaler may be recommended to help reduce shortness of breath and control the cough. A cool mist vaporizer may also be used to help thin bronchial secretions and make it easier to clear the chest.  HOME CARE INSTRUCTIONS  Get plenty of rest.   Drink enough fluids to keep your urine clear or pale yellow (unless you have a medical condition that requires fluid restriction). Increasing fluids may help thin your respiratory secretions (sputum) and reduce chest congestion, and it will prevent dehydration.   Take medicines only as directed by your health  care provider.  If you were prescribed an antibiotic medicine, finish it all even if you start to feel better.  Avoid smoking and secondhand smoke. Exposure to cigarette smoke or irritating chemicals will make bronchitis worse. If you are a smoker, consider using nicotine gum or skin patches to help control withdrawal symptoms. Quitting smoking will help your lungs heal faster.   Reduce the chances of another bout of acute bronchitis by washing your hands frequently, avoiding people with cold symptoms, and trying not to touch your hands to your mouth, nose, or eyes.   Keep all  follow-up visits as directed by your health care provider.  SEEK MEDICAL CARE IF: Your symptoms do not improve after 1 week of treatment.  SEEK IMMEDIATE MEDICAL CARE IF:  You develop an increased fever or chills.   You have chest pain.   You have severe shortness of breath.  You have bloody sputum.   You develop dehydration.  You faint or repeatedly feel like you are going to pass out.  You develop repeated vomiting.  You develop a severe headache. MAKE SURE YOU:   Understand these instructions.  Will watch your condition.  Will get help right away if you are not doing well or get worse. Document Released: 08/28/2004 Document Revised: 12/05/2013 Document Reviewed: 01/11/2013 [facility] Patient Information 2015 [place]. This information is not intended to replace advice given to you by your health care provider. Make sure you discuss any questions you have with your health care provider.

## 2014-03-13 NOTE — ED Provider Notes (Signed)
History/physical exam/procedure(s) were performed by non-physician practitioner and as supervising physician I was immediately available for consultation/collaboration. I have reviewed all notes and am in agreement with care and plan.   Hilario Quarryanielle S Gladiola Madore, MD 03/13/14 1230

## 2014-03-13 NOTE — ED Provider Notes (Signed)
Medical screening examination/treatment/procedure(s) were performed by non-physician practitioner and as supervising physician I was immediately available for consultation/collaboration.   EKG Interpretation   Date/Time:  Thursday March 09 2014 16:04:57 EDT Ventricular Rate:  96 PR Interval:  154 QRS Duration: 80 QT Interval:  356 QTC Calculation: 449 R Axis:   69 Text Interpretation:  Normal sinus rhythm Normal ECG ED PHYSICIAN  INTERPRETATION AVAILABLE IN [facility] Confirmed by TEST, Record  ([id number]) on 03/11/2014 11:04:35 AM        Richardean Canalavid H Ritamarie Arkin, MD 03/13/14 [phone]

## 2014-03-20 ENCOUNTER — Encounter (HOSPITAL_COMMUNITY): Payer: Self-pay | Admitting: Emergency Medicine

## 2014-03-20 ENCOUNTER — Emergency Department (INDEPENDENT_AMBULATORY_CARE_PROVIDER_SITE_OTHER)
Admission: EM | Admit: 2014-03-20 | Discharge: 2014-03-20 | Disposition: A | Discharge status: Home / Self Care | Payer: 59 | Source: Home / Self Care

## 2014-03-20 DIAGNOSIS — K5901 Slow transit constipation: Secondary | ICD-10-CM

## 2014-03-20 DIAGNOSIS — J45909 Unspecified asthma, uncomplicated: Secondary | ICD-10-CM

## 2014-03-20 DIAGNOSIS — J4520 Mild intermittent asthma, uncomplicated: Secondary | ICD-10-CM

## 2014-03-20 MED ORDER — METHYLPREDNISOLONE SODIUM SUCC 125 MG IJ SOLR
INTRAMUSCULAR | Status: AC
  Filled 2014-03-20: qty 2

## 2014-03-20 MED ORDER — IPRATROPIUM-ALBUTEROL 0.5-2.5 (3) MG/3ML IN SOLN
3.0000 mL | Freq: Once | RESPIRATORY_TRACT | Status: AC
  Administered 2014-03-20: 3 mL via RESPIRATORY_TRACT

## 2014-03-20 MED ORDER — METHYLPREDNISOLONE SODIUM SUCC 125 MG IJ SOLR: 80.0000 mg | Freq: Once | INTRAMUSCULAR | Status: DC

## 2014-03-20 MED ORDER — METHYLPREDNISOLONE SODIUM SUCC 125 MG IJ SOLR
80.0000 mg | Freq: Once | INTRAMUSCULAR | Status: AC
  Administered 2014-03-20: 80 mg via INTRAMUSCULAR

## 2014-03-20 MED ORDER — IPRATROPIUM-ALBUTEROL 0.5-2.5 (3) MG/3ML IN SOLN
RESPIRATORY_TRACT | Status: AC
  Filled 2014-03-20: qty 3

## 2014-03-20 MED ORDER — PREDNISONE 20 MG PO TABS: ORAL_TABLET | ORAL | Status: DC

## 2014-03-20 NOTE — ED Notes (Signed)
Peak  Flo    Prior  To  Breathing  tx  200

## 2014-03-20 NOTE — ED Provider Notes (Addendum)
CSN: [id number]     Arrival date & time 03/20/14  1140 History   First MD Initiated Contact with Patient 03/20/14 1153     Chief Complaint  Patient presents with  . Constipation   (Consider location/radiation/quality/duration/timing/severity/associated sxs/prior Treatment) HPI Comments: 48 year old female presents with 2 complaints. First complaint is that of shortness of breath this persisted after evaluation in the emergency department approximately 10 days ago. She underwent several studies including a CT angiogram and lab work. The CT showed some inflammation of the upper airways and interpreted as either reactive airway disease or bronchitis. She was treated with an albuterol HFA and a Z-Pak. She has completed the Z-Pak. She continues to use the albuterol HFA but does not seem to work for her. When she was asked how she uses the albuterol HFA she describes an incorrect method.  The second complaint is that of constipation for at least 10 days. He has not had a bowel movement in 10 days. She occasionally takes a narcotic for back pain. 3 days ago she has been taking Correctol 2 pain tablets daily, drinking milk and it self administering an enema. This has not helped.   Past Medical History  Diagnosis Date  . Microcytic anemia     a. No prior workup.  . Migraines   . Low back pain   . Atypical chest pain     a. 10/2013: suspected GI etiology. Nuc - nonischemic, EF 39% but visually higher - f/u echo to clarify showed EF 50-55% with only mild LVH, no significant valvuar disease.  [name]. HTN (hypertension)   . Abnormal CT of the chest     a. 10/2013: 4mm RML pulm nodule, axillary lymphadenopathy. Instructed to f/u PCP.   Past Surgical History  Procedure Laterality Date  . Tonsillectomy  1992?  [name]. Cesarean section  1988  . Tubal ligation  1995   Family History  Problem Relation Age of Onset  . Heart disease Father     Dx early 7740's (? heart valve), died of MI at 1971  . Congestive Heart  Failure Mother     Died in her 2170s - CHF   . Other      Siblings - No CAD   History  Substance Use Topics  . Smoking status: Never Smoker   . Smokeless tobacco: Never Used  . Alcohol Use: No   OB History   Grav Para Term Preterm Abortions TAB SAB Ect Mult Living                 Review of Systems  Constitutional: Positive for activity change. Negative for fever and fatigue.  HENT: Negative for congestion, postnasal drip and sore throat.   Respiratory: Positive for shortness of breath. Negative for chest tightness and wheezing.   Cardiovascular: Negative for chest pain and palpitations.  Gastrointestinal: Positive for constipation. Negative for abdominal pain, diarrhea and rectal pain.  Genitourinary: Negative.   Musculoskeletal: Negative.   Skin: Negative.   Neurological: Negative.     Allergies  Review of patient's allergies indicates no known allergies.  Home Medications   Prior to Admission medications   Medication Sig Start Date End Date Taking? Authorizing Provider  aspirin-acetaminophen-caffeine (EXCEDRIN MIGRAINE) [id number] MG per tablet Take 2 tablets by mouth every 6 (six) hours as needed for migraine.     Historical Provider, MD  irbesartan (AVAPRO) 150 MG tablet Take 1 tablet (150 mg total) by mouth daily. 11/10/13   Dorothea OgleIskra M Myers, MD  predniSONE (  DELTASONE) 20 MG tablet Take 3 tabs po on first day, 2 tabs second day, 2 tabs third day, 1 tab fourth day, 1 tab 5th day. Take with food. Start 03/21/14. 03/20/14   Hayden Rasmussen, NP   BP 130/96  Pulse 73  Temp(Src) 98.6 F (37 C) (Oral)  Resp 14  SpO2 100%  LMP 02/06/2014 Physical Exam  Nursing note and vitals reviewed. Constitutional: She is oriented to person, place, and time. She appears well-developed and well-nourished. No distress.  HENT:  Mouth/Throat: Oropharynx is clear and moist. No oropharyngeal exudate.  Eyes: Conjunctivae are normal.  Neck: Normal range of motion. Neck supple.  Cardiovascular: Normal  rate, regular rhythm and normal heart sounds.   Pulmonary/Chest:  Mildly diminished breath sounds bilaterally. No wheezes or crackles.  Musculoskeletal: She exhibits no edema.  Neurological: She is alert and oriented to person, place, and time. She exhibits normal muscle tone.  Skin: Skin is warm and dry.    ED Course  Procedures (including critical care time) Labs Review Labs Reviewed - No data to display  Imaging Review No results found.   MDM   1. RAD (reactive airway disease) with wheezing, mild intermittent, uncomplicated   2. Slow transit constipation     Pre peak flow 200 and 250.  Duoneb adm. Post duoneb 375 peak flow. Retrained on albuterol HFA use Start claritin or allegra Dulcolax x 4 tabs and miralax 1/2 bottle tonight. Solumedrol 80 mg IM  Take 4 Ducolax this afternoon, when you get home. Start drinking Miralax solution. Use half the bottle of 225 gms over the course of the afternoon and evening. Can still use Fleets enema if needed if you feel urge to go but difficult to start.   Hayden Rasmussen, NP 03/20/14 1256  Hayden Rasmussen, NP 06/30/14 2121

## 2014-03-20 NOTE — Discharge Instructions (Signed)
Bronchospasm Add Claritin or allegra for next 2-3 weeks Short course of Prednisone  A bronchospasm is a spasm or tightening of the airways going into the lungs. During a bronchospasm breathing becomes more difficult because the airways get smaller. When this happens there can be coughing, a whistling sound when breathing (wheezing), and difficulty breathing. Bronchospasm is often associated with asthma, but not all patients who experience a bronchospasm have asthma. CAUSES  A bronchospasm is caused by inflammation or irritation of the airways. The inflammation or irritation may be triggered by:   Allergies (such as to animals, pollen, food, or mold). Allergens that cause bronchospasm may cause wheezing immediately after exposure or many hours later.   Infection. Viral infections are believed to be the most common cause of bronchospasm.   Exercise.   Irritants (such as pollution, cigarette smoke, strong odors, aerosol sprays, and paint fumes).   Weather changes. Winds increase molds and pollens in the air. Rain refreshes the air by washing irritants out. Cold air may cause inflammation.   Stress and emotional upset.  SIGNS AND SYMPTOMS   Wheezing.   Excessive nighttime coughing.   Frequent or severe coughing with a simple cold.   Chest tightness.   Shortness of breath.  DIAGNOSIS  Bronchospasm is usually diagnosed through a history and physical exam. Tests, such as chest X-rays, are sometimes done to look for other conditions. TREATMENT   Inhaled medicines can be given to open up your airways and help you breathe. The medicines can be given using either an inhaler or a nebulizer machine.  Corticosteroid medicines may be given for severe bronchospasm, usually when it is associated with asthma. HOME CARE INSTRUCTIONS   Always have a plan prepared for seeking medical care. Know when to call your health care provider and local emergency services (911 in the [place] Know  where you can access local emergency care.  Only take medicines as directed by your health care provider.  If you were prescribed an inhaler or nebulizer machine, ask your health care provider to explain how to use it correctly. Always use a spacer with your inhaler if you were given one.  It is necessary to remain calm during an attack. Try to relax and breathe more slowly.  Control your home environment in the following ways:   Change your heating and air conditioning filter at least once a month.   Limit your use of fireplaces and wood stoves.  Do not smoke and do not allow smoking in your home.   Avoid exposure to perfumes and fragrances.   Get rid of pests (such as roaches and mice) and their droppings.   Throw away plants if you see mold on them.   Keep your house clean and dust free.   Replace carpet with wood, tile, or vinyl flooring. Carpet can trap dander and dust.   Use allergy-proof pillows, mattress covers, and box spring covers.   Wash bed sheets and blankets every week in hot water and dry them in a dryer.   Use blankets that are made of polyester or cotton.   Wash hands frequently. SEEK MEDICAL CARE IF:   You have muscle aches.   You have chest pain.   The sputum changes from clear or white to yellow, green, gray, or bloody.   The sputum you cough up gets thicker.   There are problems that may be related to the medicine you are given, such as a rash, itching, swelling, or trouble breathing.  SEEK  IMMEDIATE MEDICAL CARE IF:   You have worsening wheezing and coughing even after taking your prescribed medicines.   You have increased difficulty breathing.   You develop severe chest pain. MAKE SURE YOU:   Understand these instructions.  Will watch your condition.  Will get help right away if you are not doing well or get worse. Document Released: 07/24/2003 Document Revised: 07/26/2013 Document Reviewed: 01/10/2013 [facility]  Patient Information 2015 [place], [place]. This information is not intended to replace advice given to you by your health care provider. Make sure you discuss any questions you have with your health care provider.  Constipation Take 4 Ducolax this afternoon, when you get home. Start drinking Miralax solution. Use half the bottle of 225 gms over the course of the afternoon and evening. Can still use Fleets enema if needed if you feel urge to go but difficult to start. Constipation is when a person has fewer than three bowel movements a week, has difficulty having a bowel movement, or has stools that are dry, hard, or larger than normal. As people grow older, constipation is more common. If you try to fix constipation with medicines that make you have a bowel movement (laxatives), the problem may get worse. Long-term laxative use may cause the muscles of the colon to become weak. A low-fiber diet, not taking in enough fluids, and taking certain medicines may make constipation worse.  CAUSES   Certain medicines, such as antidepressants, pain medicine, iron supplements, antacids, and water pills.   Certain diseases, such as diabetes, irritable bowel syndrome (IBS), thyroid disease, or depression.   Not drinking enough water.   Not eating enough fiber-rich foods.   Stress or travel.   Lack of physical activity or exercise.   Ignoring the urge to have a bowel movement.   Using laxatives too much.  SIGNS AND SYMPTOMS   Having fewer than three bowel movements a week.   Straining to have a bowel movement.   Having stools that are hard, dry, or larger than normal.   Feeling full or bloated.   Pain in the lower abdomen.   Not feeling relief after having a bowel movement.  DIAGNOSIS  Your health care provider will take a medical history and perform a physical exam. Further testing may be done for severe constipation. Some tests may include:  A barium enema X-ray to examine  your rectum, colon, and, sometimes, your small intestine.   A sigmoidoscopy to examine your lower colon.   A colonoscopy to examine your entire colon. TREATMENT  Treatment will depend on the severity of your constipation and what is causing it. Some dietary treatments include drinking more fluids and eating more fiber-rich foods. Lifestyle treatments may include regular exercise. If these diet and lifestyle recommendations do not help, your health care provider may recommend taking over-the-counter laxative medicines to help you have bowel movements. Prescription medicines may be prescribed if over-the-counter medicines do not work.  HOME CARE INSTRUCTIONS   Eat foods that have a lot of fiber, such as fruits, vegetables, whole grains, and beans.  Limit foods high in fat and processed sugars, such as french fries, hamburgers, cookies, candies, and soda.   A fiber supplement may be added to your diet if you cannot get enough fiber from foods.   Drink enough fluids to keep your urine clear or pale yellow.   Exercise regularly or as directed by your health care provider.   Go to the restroom when you have the urge to  go. Do not hold it.   Only take over-the-counter or prescription medicines as directed by your health care provider. Do not take other medicines for constipation without talking to your health care provider first.  SEEK IMMEDIATE MEDICAL CARE IF:   You have bright red blood in your stool.   Your constipation lasts for more than 4 days or gets worse.   You have abdominal or rectal pain.   You have thin, pencil-like stools.   You have unexplained weight loss. MAKE SURE YOU:   Understand these instructions.  Will watch your condition.  Will get help right away if you are not doing well or get worse. Document Released: 04/18/2004 Document Revised: 07/26/2013 Document Reviewed: 05/02/2013 [facility] Patient Information 2015 [place], [place]. This information is  not intended to replace advice given to you by your health care provider. Make sure you discuss any questions you have with your health care provider.  How to Use an Inhaler Using your inhaler correctly is very important. Good technique will make sure that the medicine reaches your lungs.  HOW TO USE AN INHALER: 1. Take the cap off the inhaler. 2. If this is the first time using your inhaler, you need to prime it. Shake the inhaler for 5 seconds. Release four puffs into the air, away from your face. Ask your doctor for help if you have questions. 3. Shake the inhaler for 5 seconds. 4. Turn the inhaler so the bottle is above the mouthpiece. 5. Put your pointer finger on top of the bottle. Your thumb holds the bottom of the inhaler. 6. Open your mouth. 7. Either hold the inhaler away from your mouth (the width of 2 fingers) or place your lips tightly around the mouthpiece. Ask your doctor which way to use your inhaler. 8. Breathe out as much air as possible. 9. Breathe in and push down on the bottle 1 time to release the medicine. You will feel the medicine go in your mouth and throat. 10. Continue to take a deep breath in very slowly. Try to fill your lungs. 11. After you have breathed in completely, hold your breath for 10 seconds. This will help the medicine to settle in your lungs. If you cannot hold your breath for 10 seconds, hold it for as long as you can before you breathe out. 12. Breathe out slowly, through pursed lips. Whistling is an example of pursed lips. 13. If your doctor has told you to take more than 1 puff, wait at least 15-30 seconds between puffs. This will help you get the best results from your medicine. Do not use the inhaler more than your doctor tells you to. 14. Put the cap back on the inhaler. 15. Follow the directions from your doctor or from the inhaler package about cleaning the inhaler. If you use more than one inhaler, ask your doctor which inhalers to use and what  order to use them in. Ask your doctor to help you figure out when you will need to refill your inhaler.  If you use a steroid inhaler, always rinse your mouth with water after your last puff, gargle and spit out the water. Do not swallow the water. GET HELP IF:  The inhaler medicine only partially helps to stop wheezing or shortness of breath.  You are having trouble using your inhaler.  You have some increase in thick spit (phlegm). GET HELP RIGHT AWAY IF:  The inhaler medicine does not help your wheezing or shortness of breath or  you have tightness in your chest.  You have dizziness, headaches, or fast heart rate.  You have chills, fever, or night sweats.  You have a large increase of thick spit, or your thick spit is bloody. MAKE SURE YOU:   Understand these instructions.  Will watch your condition.  Will get help right away if you are not doing well or get worse. Document Released: 04/29/2008 Document Revised: 05/11/2013 Document Reviewed: 02/17/2013 [facility] Patient Information 2015 [place]. This information is not intended to replace advice given to you by your health care provider. Make sure you discuss any questions you have with your health care provider.

## 2014-03-20 NOTE — ED Notes (Addendum)
Pt reports  Two  Issues  todat  She reports   abd  Discomfort  Associated  With  Some  Constipation  As  Well  As       Some   Shortness of  Breath       Pt  Reports  Recently  Treated  For  Bronchitis  In the  [facility]  And  Completed  A  z  Pack           -  She is  However  Sitting upright on the  Exam table  And  Appears  In no  Severe  Distress             Speaking in  Complete  sentances    [t  Reports  The  Constipation as  Has  Not resolved  By  otc  remedys

## 2014-03-22 NOTE — ED Provider Notes (Signed)
Medical screening examination/treatment/procedure(s) were performed by non-physician practitioner and as supervising physician I was immediately available for consultation/collaboration.  Leslee Homeavid Yaslene Lindamood, M.D.  Reuben Likesavid C Siddhi Dornbush, MD 03/22/14 0800

## 2014-04-07 ENCOUNTER — Encounter: Payer: Self-pay | Admitting: Internal Medicine

## 2014-04-07 ENCOUNTER — Ambulatory Visit: Payer: 59 | Attending: Internal Medicine | Admitting: Internal Medicine

## 2014-04-07 VITALS — BP 126/82 | HR 81 | Temp 98.8°F | Resp 16 | Ht 66.0 in | Wt 235.0 lb

## 2014-04-07 DIAGNOSIS — F411 Generalized anxiety disorder: Secondary | ICD-10-CM | POA: Diagnosis not present

## 2014-04-07 DIAGNOSIS — R799 Abnormal finding of blood chemistry, unspecified: Secondary | ICD-10-CM

## 2014-04-07 DIAGNOSIS — Z7982 Long term (current) use of aspirin: Secondary | ICD-10-CM | POA: Diagnosis not present

## 2014-04-07 DIAGNOSIS — R7989 Other specified abnormal findings of blood chemistry: Secondary | ICD-10-CM

## 2014-04-07 DIAGNOSIS — Z23 Encounter for immunization: Secondary | ICD-10-CM | POA: Diagnosis not present

## 2014-04-07 DIAGNOSIS — IMO0002 Reserved for concepts with insufficient information to code with codable children: Secondary | ICD-10-CM | POA: Insufficient documentation

## 2014-04-07 DIAGNOSIS — I10 Essential (primary) hypertension: Secondary | ICD-10-CM | POA: Diagnosis not present

## 2014-04-07 DIAGNOSIS — R944 Abnormal results of kidney function studies: Secondary | ICD-10-CM | POA: Insufficient documentation

## 2014-04-07 LAB — COMPLETE METABOLIC PANEL WITH GFR
ALT: 14 U/L (ref 0–35)
AST: 15 U/L (ref 0–37)
Albumin: 3.9 g/dL (ref 3.5–5.2)
Alkaline Phosphatase: 66 U/L (ref 39–117)
BILIRUBIN TOTAL: 0.4 mg/dL (ref 0.2–1.2)
BUN: 12 mg/dL (ref 6–23)
CHLORIDE: 104 meq/L (ref 96–112)
CO2: 26 meq/L (ref 19–32)
CREATININE: 1.06 mg/dL (ref 0.50–1.10)
Calcium: 9.2 mg/dL (ref 8.4–10.5)
GFR, EST AFRICAN AMERICAN: 72 mL/min
GFR, EST NON AFRICAN AMERICAN: 62 mL/min
GLUCOSE: 77 mg/dL (ref 70–99)
Potassium: 4.3 mEq/L (ref 3.5–5.3)
Sodium: 137 mEq/L (ref 135–145)
Total Protein: 7.1 g/dL (ref 6.0–8.3)

## 2014-04-07 MED ORDER — ESCITALOPRAM OXALATE 10 MG PO TABS: 10.0000 mg | ORAL_TABLET | Freq: Every day | ORAL | Status: DC

## 2014-04-07 MED ORDER — IRBESARTAN 150 MG PO TABS: 150.0000 mg | ORAL_TABLET | Freq: Every day | ORAL | Status: DC

## 2014-04-07 MED ORDER — LORAZEPAM 0.5 MG PO TABS: 0.5000 mg | ORAL_TABLET | Freq: Every day | ORAL | Status: DC | PRN

## 2014-04-07 NOTE — Patient Instructions (Signed)
Generalized Anxiety Disorder Generalized anxiety disorder (GAD) is a mental disorder. It interferes with life functions, including relationships, work, and school. GAD is different from normal anxiety, which everyone experiences at some point in their lives in response to specific life events and activities. Normal anxiety actually helps us prepare for and get through these life events and activities. Normal anxiety goes away after the event or activity is over.  GAD causes anxiety that is not necessarily related to specific events or activities. It also causes excess anxiety in proportion to specific events or activities. The anxiety associated with GAD is also difficult to control. GAD can vary from mild to severe. People with severe GAD can have intense waves of anxiety with physical symptoms (panic attacks).  SYMPTOMS The anxiety and worry associated with GAD are difficult to control. This anxiety and worry are related to many life events and activities and also occur more days than not for 6 months or longer. People with GAD also have three or more of the following symptoms (one or more in children):  Restlessness.   Fatigue.  Difficulty concentrating.   Irritability.  Muscle tension.  Difficulty sleeping or unsatisfying sleep. DIAGNOSIS GAD is diagnosed through an assessment by your health care provider. Your health care provider will ask you questions aboutyour mood,physical symptoms, and events in your life. Your health care provider may ask you about your medical history and use of alcohol or drugs, including prescription medicines. Your health care provider may also do a physical exam and blood tests. Certain medical conditions and the use of certain substances can cause symptoms similar to those associated with GAD. Your health care provider may refer you to a mental health specialist for further evaluation. TREATMENT The following therapies are usually used to treat GAD:    Medication. Antidepressant medication usually is prescribed for long-term daily control. Antianxiety medicines may be added in severe cases, especially when panic attacks occur.   Talk therapy (psychotherapy). Certain types of talk therapy can be helpful in treating GAD by providing support, education, and guidance. A form of talk therapy called cognitive behavioral therapy can teach you healthy ways to think about and react to daily life events and activities.  Stress managementtechniques. These include yoga, meditation, and exercise and can be very helpful when they are practiced regularly. A mental health specialist can help determine which treatment is best for you. Some people see improvement with one therapy. However, other people require a combination of therapies. Document Released: 11/15/2012 Document Revised: 12/05/2013 Document Reviewed: 11/15/2012 [employer] Patient Information 2015 [employer]. This information is not intended to replace advice given to you by your health care provider. Make sure you discuss any questions you have with your health care provider.  

## 2014-04-07 NOTE — Progress Notes (Signed)
Patient ID: Monique Aguilar, female   DOB: 12/20/1965, 48 y.o.   MRN: [id number]  CC: HTN, anxiety  HPI:  Patient reports that she has been having episodes of high blood pressure ranging from 180/110.  She states that she gets blurred vision, diaphoretic, and anxious.  She does have a history of anxiety.  She reports that she does not like to drive on the highway or ride with others because it causes her severe anxiety.  She reports that she gets anxious when riding on a escalator.  She reports when she removes her self from the stressful situation she feels better.  She has been on ativan in the past. Blood pressure is within normal limits when she   No Known Allergies Past Medical History  Diagnosis Date  . Microcytic anemia     a. No prior workup.  . Migraines   . Low back pain   . Atypical chest pain     a. 10/2013: suspected GI etiology. Nuc - nonischemic, EF 39% but visually higher - f/u echo to clarify showed EF 50-55% with only mild LVH, no significant valvuar disease.  [name] HTN (hypertension)   . Abnormal CT of the chest     a. 10/2013: 4mm RML pulm nodule, axillary lymphadenopathy. Instructed to f/u PCP.   Current Outpatient Prescriptions on File Prior to Visit  Medication Sig Dispense Refill  . aspirin-acetaminophen-caffeine (EXCEDRIN MIGRAINE) 250-250-65 MG per tablet Take 2 tablets by mouth every 6 (six) hours as needed for migraine.       . irbesartan (AVAPRO) 150 MG tablet Take 1 tablet (150 mg total) by mouth daily.  30 tablet  11  . predniSONE (DELTASONE) 20 MG tablet Take 3 tabs po on first day, 2 tabs second day, 2 tabs third day, 1 tab fourth day, 1 tab 5th day. Take with food. Start 03/21/14.  9 tablet  0   No current facility-administered medications on file prior to visit.   Family History  Problem Relation Age of Onset  . Heart disease Father     Dx early 25's (? heart valve), died of MI at 77  . Congestive Heart Failure Mother     Died in her 41s - CHF   . Other       Siblings - No CAD   History   Social History  . Marital Status: Single    Spouse Name: N/A    Number of Children: N/A  . Years of Education: N/A   Occupational History  . Not on file.   Social History Main Topics  . Smoking status: Never Smoker   . Smokeless tobacco: Never Used  . Alcohol Use: No  . Drug Use: No  . Sexual Activity: Not Currently    Birth Control/ Protection: Surgical   Other Topics Concern  . Not on file   Social History Narrative  . No narrative on file   Review of Systems  Eyes: Positive for blurred vision.  Respiratory: Negative.   Cardiovascular: Negative.   Neurological: Positive for dizziness (episodic), tingling (episodic) and headaches (migraines).      Objective:   Filed Vitals:   04/07/14 1117  BP: 126/82  Pulse: 81  Temp: 98.8 F (37.1 C)  Resp: 16   Physical Exam  Constitutional: She is oriented to person, place, and time.  Cardiovascular: Normal rate, regular rhythm and normal heart sounds.   Pulmonary/Chest: Effort normal and breath sounds normal.  Abdominal: Soft. Bowel sounds are normal.  Musculoskeletal: Normal range of motion.  Neurological: She is alert and oriented to person, place, and time. She has normal reflexes. No cranial nerve deficit.  Skin: Skin is warm and dry.  Psychiatric: She has a normal mood and affect.     Lab Results  Component Value Date   WBC 6.0 03/09/2014   HGB 10.6* 03/09/2014   HCT 32.6* 03/09/2014   MCV 81.9 03/09/2014   PLT 274 03/09/2014   Lab Results  Component Value Date   CREATININE 1.11* 03/09/2014   BUN 12 03/09/2014   NA 139 03/09/2014   K 4.4 03/09/2014   CL 105 03/09/2014   CO2 22 03/09/2014    Lab Results  Component Value Date   HGBA1C 5.2 12/16/2012   Lipid Panel     Component Value Date/Time   CHOL 151 10/11/2013 0125   TRIG 162* 10/11/2013 0125   HDL 48 10/11/2013 0125   CHOLHDL 3.1 10/11/2013 0125   VLDL 32 10/11/2013 0125   LDLCALC 71 10/11/2013 0125       Assessment and plan:    Latysha was seen today for follow-up.  Diagnoses and associated orders for this visit:  Essential hypertension - Continue irbesartan (AVAPRO) 150 MG tablet; Take 1 tablet (150 mg total) by mouth daily. Bp is well controlled on medication  Elevated serum creatinine - COMPLETE METABOLIC PANEL WITH GFR  Anxiety state, unspecified - LORazepam (ATIVAN) 0.5 MG tablet; Take 1 tablet (0.5 mg total) by mouth daily as needed for anxiety. - escitalopram (LEXAPRO) 10 MG tablet; Take 1 tablet (10 mg total) by mouth daily.  Need for prophylactic vaccination and inoculation against influenza Receive influenza vaccine today  Return in about 6 weeks (around 05/19/2014) for anxiety.        Holland Commons, NP-C [facility] [phone] 04/07/2014, 11:50 AM

## 2014-04-07 NOTE — Progress Notes (Signed)
Pt is here following up on her HTN. Pt reports needing medication for her anxiety.

## 2014-04-11 ENCOUNTER — Telehealth: Payer: Self-pay | Admitting: Emergency Medicine

## 2014-04-11 ENCOUNTER — Telehealth: Payer: Self-pay | Admitting: Internal Medicine

## 2014-04-11 NOTE — Telephone Encounter (Signed)
Pt given normal lab results 

## 2014-04-11 NOTE — Telephone Encounter (Signed)
Pt calling for lab work results. Please f/u with pt.  °

## 2014-04-11 NOTE — Telephone Encounter (Signed)
Message copied by Darlis Loan on Tue Apr 11, 2014  1:10 PM ------      Message from: Holland Commons A      Created: Mon Apr 10, 2014 12:48 PM       Kidney function has improved and is back to normal. ------

## 2014-04-12 NOTE — Telephone Encounter (Signed)
LVM informing of below 

## 2014-05-30 ENCOUNTER — Emergency Department (HOSPITAL_COMMUNITY): Payer: 59

## 2014-05-30 ENCOUNTER — Emergency Department (HOSPITAL_COMMUNITY)
Admission: EM | Admit: 2014-05-30 | Discharge: 2014-05-30 | Disposition: A | Discharge status: Home / Self Care | Payer: 59 | Attending: Emergency Medicine | Admitting: Emergency Medicine

## 2014-05-30 ENCOUNTER — Encounter (HOSPITAL_COMMUNITY): Payer: Self-pay | Admitting: Emergency Medicine

## 2014-05-30 DIAGNOSIS — I10 Essential (primary) hypertension: Secondary | ICD-10-CM | POA: Insufficient documentation

## 2014-05-30 DIAGNOSIS — Z862 Personal history of diseases of the blood and blood-forming organs and certain disorders involving the immune mechanism: Secondary | ICD-10-CM | POA: Insufficient documentation

## 2014-05-30 DIAGNOSIS — R0789 Other chest pain: Secondary | ICD-10-CM | POA: Insufficient documentation

## 2014-05-30 DIAGNOSIS — R079 Chest pain, unspecified: Secondary | ICD-10-CM | POA: Diagnosis present

## 2014-05-30 DIAGNOSIS — Z79899 Other long term (current) drug therapy: Secondary | ICD-10-CM | POA: Insufficient documentation

## 2014-05-30 DIAGNOSIS — G43909 Migraine, unspecified, not intractable, without status migrainosus: Secondary | ICD-10-CM | POA: Insufficient documentation

## 2014-05-30 LAB — CBC
HCT: 35.4 % — ABNORMAL LOW (ref 36.0–46.0)
Hemoglobin: 11.5 g/dL — ABNORMAL LOW (ref 12.0–15.0)
MCH: 26.6 pg (ref 26.0–34.0)
MCHC: 32.5 g/dL (ref 30.0–36.0)
MCV: 81.8 fL (ref 78.0–100.0)
Platelets: 305 10*3/uL (ref 150–400)
RBC: 4.33 MIL/uL (ref 3.87–5.11)
RDW: 14.3 % (ref 11.5–15.5)
WBC: 5.6 10*3/uL (ref 4.0–10.5)

## 2014-05-30 LAB — PRO B NATRIURETIC PEPTIDE: PRO B NATRI PEPTIDE: 125.6 pg/mL — AB (ref 0–125)

## 2014-05-30 LAB — BASIC METABOLIC PANEL
ANION GAP: 14 (ref 5–15)
BUN: 11 mg/dL (ref 6–23)
CO2: 24 meq/L (ref 19–32)
Calcium: 9.4 mg/dL (ref 8.4–10.5)
Chloride: 100 mEq/L (ref 96–112)
Creatinine, Ser: 1.03 mg/dL (ref 0.50–1.10)
GFR calc Af Amer: 73 mL/min — ABNORMAL LOW (ref >90)
GFR, EST NON AFRICAN AMERICAN: 63 mL/min — AB (ref >90)
Glucose, Bld: 85 mg/dL (ref 70–99)
POTASSIUM: 4.8 meq/L (ref 3.7–5.3)
SODIUM: 138 meq/L (ref 137–147)

## 2014-05-30 LAB — I-STAT TROPONIN, ED: Troponin i, poc: 0 ng/mL (ref 0.00–0.08)

## 2014-05-30 NOTE — Discharge Instructions (Signed)

## 2014-05-30 NOTE — ED Provider Notes (Signed)
CSN: [id number]     Arrival date & time 05/30/14  1203 History   First MD Initiated Contact with Patient 05/30/14 1454     Chief Complaint  Patient presents with  . Chest Pain     (Consider location/radiation/quality/duration/timing/severity/associated sxs/prior Treatment) HPI Monique Aguilar is a 48 y.o. female with hypertension who comes in for evaluation of chest discomfort. Patient states today at noon she was sitting down to eat lunch when she began to "feel funny", she reports mild chest discomfort she characterizes as a sharp intermittent pain in the center of her chest that was fleeting and lasted only seconds. The pain did not radiate anywhere and did not have any associated nausea, vomiting, diaphoresis. She reports having an appointment with [facility] cardiology tomorrow morning for follow-up and to establish care. She denies any previous cardiac history. Reports mother had a heart attack in her late 5940s and ultimately died from congestive heart failure. Nonsmoker  Past Medical History  Diagnosis Date  . Microcytic anemia     a. No prior workup.  . Migraines   . Low back pain   . Atypical chest pain     a. 10/2013: suspected GI etiology. Nuc - nonischemic, EF 39% but visually higher - f/u echo to clarify showed EF 50-55% with only mild LVH, no significant valvuar disease.  [name]. HTN (hypertension)   . Abnormal CT of the chest     a. 10/2013: 4mm RML pulm nodule, axillary lymphadenopathy. Instructed to f/u PCP.   Past Surgical History  Procedure Laterality Date  . Tonsillectomy  1992?  [name]. Cesarean section  1988  . Tubal ligation  1995   Family History  Problem Relation Age of Onset  . Heart disease Father     Dx early 4740's (? heart valve), died of MI at 8171  . Congestive Heart Failure Mother     Died in her 470s - CHF   . Other      Siblings - No CAD   History  Substance Use Topics  . Smoking status: Never Smoker   . Smokeless tobacco: Never Used  . Alcohol Use: No   OB  History   Grav Para Term Preterm Abortions TAB SAB Ect Mult Living                 Review of Systems  Constitutional: Negative for fever.  HENT: Negative for sore throat.   Eyes: Negative for visual disturbance.  Respiratory: Negative for shortness of breath.   Cardiovascular: Positive for chest pain.  Gastrointestinal: Negative for abdominal pain.  Endocrine: Negative for polyuria.  Genitourinary: Negative for dysuria.  Skin: Negative for rash.  Neurological: Negative for headaches.      Allergies  Review of patient's allergies indicates no known allergies.  Home Medications   Prior to Admission medications   Medication Sig Start Date End Date Taking? Authorizing Provider  aspirin-acetaminophen-caffeine (EXCEDRIN MIGRAINE) [phone] MG per tablet Take 2 tablets by mouth every 6 (six) hours as needed for migraine.     Historical Provider, MD  escitalopram (LEXAPRO) 10 MG tablet Take 1 tablet (10 mg total) by mouth daily. 04/07/14   Ambrose FinlandValerie A Keck, NP  irbesartan (AVAPRO) 150 MG tablet Take 1 tablet (150 mg total) by mouth daily. 04/07/14   Ambrose FinlandValerie A Keck, NP  LORazepam (ATIVAN) 0.5 MG tablet Take 1 tablet (0.5 mg total) by mouth daily as needed for anxiety. 04/07/14   Ambrose FinlandValerie A Keck, NP  predniSONE (DELTASONE) 20 MG  tablet Take 3 tabs po on first day, 2 tabs second day, 2 tabs third day, 1 tab fourth day, 1 tab 5th day. Take with food. Start 03/21/14. 03/20/14   Hayden Rasmussenavid Mabe, NP   BP 127/74  Pulse 79  Temp(Src) 98.3 F (36.8 C) (Oral)  Resp 17  SpO2 100%  LMP 05/16/2014 Physical Exam  Nursing note and vitals reviewed. Constitutional: She is oriented to person, place, and time. She appears well-developed and well-nourished.  HENT:  Head: Normocephalic and atraumatic.  Mouth/Throat: Oropharynx is clear and moist.  Eyes: Conjunctivae are normal. Pupils are equal, round, and reactive to light. Right eye exhibits no discharge. Left eye exhibits no discharge. No scleral icterus.   Neck: Neck supple.  Cardiovascular: Normal rate, regular rhythm and normal heart sounds.   Pulmonary/Chest: Effort normal and breath sounds normal. No respiratory distress. She has no wheezes. She has no rales.  Abdominal: Soft. There is no tenderness.  Musculoskeletal: She exhibits no tenderness.  Neurological: She is alert and oriented to person, place, and time.  Cranial Nerves II-XII grossly intact. No focal neuro deficits  Skin: Skin is warm and dry. No rash noted.  Psychiatric: She has a normal mood and affect.    ED Course  Procedures (including critical care time) Labs Review Labs Reviewed  CBC - Abnormal; Notable for the following:    Hemoglobin 11.5 (*)    HCT 35.4 (*)    All other components within normal limits  BASIC METABOLIC PANEL - Abnormal; Notable for the following:    GFR calc non Af Amer 63 (*)    GFR calc Af Amer 73 (*)    All other components within normal limits  PRO B NATRIURETIC PEPTIDE - Abnormal; Notable for the following:    Pro B Natriuretic peptide (BNP) 125.6 (*)    All other components within normal limits  I-STAT TROPOININ, ED    Imaging Review Dg Chest 2 View  05/30/2014   CLINICAL DATA:  Chest pain and difficulty breathing for 1 day  EXAM: CHEST  2 VIEW  COMPARISON:  Chest radiograph and chest CT March 09, 2014  FINDINGS: Lungs are clear. The heart size and pulmonary vascularity are normal. No adenopathy. No pneumothorax. No bone lesions. There are rudimentary cervical ribs bilaterally.  IMPRESSION: No edema or consolidation.   Electronically Signed   By: Bretta BangWilliam  Woodruff M.D.   On: 05/30/2014 13:56     EKG Interpretation None      MDM  Vitals stable - WNL -afebrile Pt resting comfortably in ED. Heart Score 2 PE not concerning for other acute or emergent pathology. No evidence of respiratory distress. Sharp, fleeting chest pain not consistent with ACS picture. Labwork noncontributory. Troponin negative. EKG not concerning for acute  pathology Imaging--chest x-ray shows no sign of acute cardiopulmonary pathology Patient is free to follow up with [facility] cardiology tomorrow for her regularly scheduled appointment Discussed f/u with PCP and return precautions, pt very amenable to plan. Pt stable, in good condition and is appropriate for discharge  Prior to patient discharge, I discussed and reviewed this case with Dr.James    Final diagnoses:  Atypical chest pain        Sharlene MottsBenjamin W Barton Want, PA-C 05/30/14 1647

## 2014-05-30 NOTE — ED Notes (Signed)
PA at bedside.

## 2014-05-30 NOTE — ED Notes (Addendum)
Pt reports sudden onset of 7/10 sharp central chest pain with SOB appx 5 minutes ago. Denies radiation, dizziness, nausea or weakness. Reports similar episodes in the pain. PT in NAD.

## 2014-05-31 ENCOUNTER — Ambulatory Visit (INDEPENDENT_AMBULATORY_CARE_PROVIDER_SITE_OTHER): Payer: 59 | Admitting: Cardiology

## 2014-05-31 ENCOUNTER — Encounter: Payer: Self-pay | Admitting: Cardiology

## 2014-05-31 VITALS — BP 110/84 | HR 83 | Ht 66.0 in | Wt 231.0 lb

## 2014-05-31 DIAGNOSIS — Z8249 Family history of ischemic heart disease and other diseases of the circulatory system: Secondary | ICD-10-CM

## 2014-05-31 DIAGNOSIS — E785 Hyperlipidemia, unspecified: Secondary | ICD-10-CM

## 2014-05-31 MED ORDER — HYDROCHLOROTHIAZIDE 25 MG PO TABS: 25.0000 mg | ORAL_TABLET | Freq: Every day | ORAL | Status: DC

## 2014-05-31 NOTE — Progress Notes (Signed)
Patient ID: Monique Aguilar, female   DOB: 07-05-1966, 48 y.o.   MRN: [id number]    Patient Name: Monique LintCathy A Rodriges Date of Encounter: 05/31/2014  Primary Care Provider:  Holland CommonsKECK, VALERIE, NP Primary Cardiologist:  Lars MassonNELSON, Ardena Gangl H  Problem List   Past Medical History  Diagnosis Date  . Microcytic anemia     a. No prior workup.  . Migraines   . Low back pain   . Atypical chest pain     a. 10/2013: suspected GI etiology. Nuc - nonischemic, EF 39% but visually higher - f/u echo to clarify showed EF 50-55% with only mild LVH, no significant valvuar disease.  [name]. HTN (hypertension)   . Abnormal CT of the chest     a. 10/2013: 4mm RML pulm nodule, axillary lymphadenopathy. Instructed to f/u PCP.   Past Surgical History  Procedure Laterality Date  . Tonsillectomy  1992?  [name]. Cesarean section  1988  . Tubal ligation  1995    Allergies  No Known Allergies  HPI  Monique LintCathy A Wist is a 48 y.o. female with hypertension who comes in for evaluation of chest discomfort. Patient states today at noon she was sitting down to eat lunch when she began to "feel funny", she reports mild chest discomfort she characterizes as a sharp intermittent pain in the center of her chest that was fleeting and lasted only seconds. The pain did not radiate anywhere and did not have any associated nausea, vomiting, diaphoresis. The patient states that this pain gets worse when she lays down in bed and improves when she sits up or lays on her left side. She would have D [place] approximately twice a week. She was hospitalized in March of 204 similar type of pain when before meals as well as ruled out, echocardiogram note showed normal systolic and diastolic function. No valvular abnormality and only mild LVH. [name] has noticed that in the last months her blood pressure has been elevated she has been checking it every day at noon and she has measurements as high as 180/95. She's also complaining of new lower extremity edema and getting  more short of breath on exertion. She has never smoked, her mother had myocardial infarction at age of 49 and ultimately died from congestive heart failure.  . Home Medications  Prior to Admission medications   Medication Sig Start Date End Date Taking? Authorizing Provider  aspirin-acetaminophen-caffeine (EXCEDRIN MIGRAINE) [phone] MG per tablet Take 2 tablets by mouth every 6 (six) hours as needed for migraine.    Yes Historical Provider, MD  irbesartan (AVAPRO) 150 MG tablet Take 1 tablet (150 mg total) by mouth daily. 04/07/14  Yes Ambrose FinlandValerie A Keck, NP  LORazepam (ATIVAN) 0.5 MG tablet  04/07/14  Yes Historical Provider, MD    Family History  Family History  Problem Relation Age of Onset  . Heart disease Father     Dx early 8240's (? heart valve)  . Congestive Heart Failure Mother     Died in her 4770s - CHF   . CAD Neg Hx   . Heart attack Father [id number]    Social History  History   Social History  . Marital Status: Single    Spouse Name: N/A    Number of Children: N/A  . Years of Education: N/A   Occupational History  . Not on file.   Social History Main Topics  . Smoking status: Never Smoker   . Smokeless tobacco: Never Used  . Alcohol Use: No  .  Drug Use: No  . Sexual Activity: Not Currently    Birth Control/ Protection: Surgical   Other Topics Concern  . Not on file   Social History Narrative  . No narrative on file     Review of Systems, as per HPI, otherwise negative General:  No chills, fever, night sweats or weight changes.  Cardiovascular:  No chest pain, dyspnea on exertion, edema, orthopnea, palpitations, paroxysmal nocturnal dyspnea. Dermatological: No rash, lesions/masses Respiratory: No cough, dyspnea Urologic: No hematuria, dysuria Abdominal:   No nausea, vomiting, diarrhea, bright red blood per rectum, melena, or hematemesis Neurologic:  No visual changes, wkns, changes in mental status. All other systems reviewed and are otherwise negative except as  noted above.  Physical Exam  Blood pressure 110/84, pulse 83, height 5\' 6"  (1.676 m), weight 231 lb (104.781 kg), last menstrual period 05/16/2014, SpO2 98.00%.  General: Pleasant, NAD Psych: Normal affect. Neuro: Alert and oriented X 3. Moves all extremities spontaneously. HEENT: Normal  Neck: Supple without bruits or JVD. Lungs:  Resp regular and unlabored, CTA. Heart: RRR no s3, s4, or murmurs. Abdomen: Soft, non-tender, non-distended, BS + x 4.  Extremities: No clubbing, cyanosis or edema. DP/PT/Radials 2+ and equal bilaterally.  Labs:  No results found for this basename: CKTOTAL, CKMB, TROPONINI,  in the last 72 hours Lab Results  Component Value Date   WBC 5.6 05/30/2014   HGB 11.5* 05/30/2014   HCT 35.4* 05/30/2014   MCV 81.8 05/30/2014   PLT 305 05/30/2014    Lab Results  Component Value Date   DDIMER 0.51* 03/09/2014   No components found with this basename: POCBNP,     Component Value Date/Time   NA 138 05/30/2014 1211   K 4.8 05/30/2014 1211   CL 100 05/30/2014 1211   CO2 24 05/30/2014 1211   GLUCOSE 85 05/30/2014 1211   BUN 11 05/30/2014 1211   CREATININE 1.03 05/30/2014 1211   CREATININE 1.06 04/07/2014 1217   CALCIUM 9.4 05/30/2014 1211   PROT 7.1 04/07/2014 1217   ALBUMIN 3.9 04/07/2014 1217   AST 15 04/07/2014 1217   ALT 14 04/07/2014 1217   ALKPHOS 66 04/07/2014 1217   BILITOT 0.4 04/07/2014 1217   GFRNONAA 63* 05/30/2014 1211   GFRNONAA 62 04/07/2014 1217   GFRAA 73* 05/30/2014 1211   GFRAA 72 04/07/2014 1217   Lab Results  Component Value Date   CHOL 151 10/11/2013   HDL 48 10/11/2013   LDLCALC 71 10/11/2013   TRIG 162* 10/11/2013    Accessory Clinical Findings  Echocardiogram - 10/11/2013  Left ventricle: The cavity size was normal. Wall thickness was increased in a pattern of mild LVH. Systolic function was normal. The estimated ejection fraction was in the range of 50% to 55%. Wall motion was normal; there were no regional wall motion abnormalities.  The transmitral flow pattern was normal. The deceleration time of the early transmitral flow velocity was normal. The pulmonary vein flow pattern was normal. The tissue Doppler parameters were normal. Left ventricular diastolic function parameters were normal. ------------------------------------------------------------ Aortic valve: Structurally normal valve. Cusp separation was normal. Doppler: Transvalvular velocity was within the normal range. There was no stenosis. No regurgitation. ------------------------------------------------------------ Aorta: The aorta was normal, not dilated, and non-diseased. ------------------------------------------------------------ Mitral valve: Structurally normal valve. Leaflet separation was normal. Doppler: Transvalvular velocity was within the normal range. There was no evidence for stenosis. No regurgitation. Peak gradient: 3mm Hg (D). ------------------------------------------------------------ Left atrium: The atrium was normal in size. ------------------------------------------------------------ Right ventricle:  The cavity size was normal. Wall thickness was normal. Systolic function was normal. ------------------------------------------------------------ Pulmonic valve: Structurally normal valve. Cusp separation was normal. Doppler: Transvalvular velocity was within the normal range. No regurgitation. ------------------------------------------------------------ Tricuspid valve: Structurally normal valve. Leaflet separation was normal. Doppler: Transvalvular velocity was within the normal range. No regurgitation.  ECG - SR, normal ECG  Lexiscan nuclear stress test:     Assessment & Plan  A very pleasant 48 year old female  1. Chest pain - rather atypical and might represent pericarditis for now we'll focus under good management of blood pressure and if her chest pain continues we'll order cardiac MRI. Patient has multiple risk  factor for coronary artery disease including family history of premature coronary artery disease, hypertension, obesity and physical clinic today. Her stress test was negative in March of this year.  2. Hypertension - with evidence of mild LVH on her echocardiogram, refill at hydrochlorothiazide 25 mg daily to the regiment of tuberous heart and 150 mg daily.  3. Lipids - mildly elevated triglycerides 162, she has plenty of room for improvement in her diet. She was counseled on proper diet for now no medication.  Follow-up in 6 weeks with blood pressure diary.    Lars MassonNELSON, [place] Sanluis H, MD, [facility] 05/31/2014, 11:32 AM

## 2014-05-31 NOTE — Patient Instructions (Signed)
Your physician has recommended you make the following change in your medication:    START TAKING HYDROCHLOROTHIAZIDE 25 MG DAILY    Your physician recommends that you schedule a follow-up appointment in: WITH DR NELSON AT HER NEXT AVAILABLE APPOINTMENT

## 2014-06-05 ENCOUNTER — Telehealth: Payer: Self-pay | Admitting: Cardiology

## 2014-06-05 MED ORDER — ATENOLOL 25 MG PO TABS: 25.0000 mg | ORAL_TABLET | Freq: Every day | ORAL | Status: DC

## 2014-06-05 NOTE — Telephone Encounter (Signed)
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 dhe is taking her meds each morning before work. Per verbal order of Dr. Delton SeeNelson, add Atenolol 25mg  daily. Patient advised of this. She will return to work tomorrow.

## 2014-06-05 NOTE — Telephone Encounter (Signed)
New message     Update on bp Wed 29th 186/102 pulse 115; thurs 168/110 pulse 122; fri 182/116 pulse 128; sat 116/101 pulse 106; sun 184/108 pulse 121; mon 180/108 pulse 114---sent home from work today because of high bp. Pt states she is not peeing a lot and ankles are still swollen.  She is out of breath if she exert herself any.  Please call because her job will not let her work as long as her bp is high.

## 2014-07-10 ENCOUNTER — Ambulatory Visit (INDEPENDENT_AMBULATORY_CARE_PROVIDER_SITE_OTHER): Payer: 59 | Admitting: Cardiology

## 2014-07-10 ENCOUNTER — Encounter: Payer: Self-pay | Admitting: Cardiology

## 2014-07-10 VITALS — BP 128/74 | HR 72 | Ht 66.0 in | Wt 232.0 lb

## 2014-07-10 DIAGNOSIS — R0609 Other forms of dyspnea: Secondary | ICD-10-CM

## 2014-07-10 DIAGNOSIS — R079 Chest pain, unspecified: Secondary | ICD-10-CM

## 2014-07-10 DIAGNOSIS — R0600 Dyspnea, unspecified: Secondary | ICD-10-CM

## 2014-07-10 DIAGNOSIS — I10 Essential (primary) hypertension: Secondary | ICD-10-CM

## 2014-07-10 DIAGNOSIS — E785 Hyperlipidemia, unspecified: Secondary | ICD-10-CM

## 2014-07-10 NOTE — Patient Instructions (Signed)
Your physician recommends that you continue on your current medications as directed. Please refer to the Current Medication list given to you today.    DR NELSON HAS ORDERED FOR YOU TO HAVE A CORONARY CT TO BE DONE NEXT Monday OR Tuesday 07/17/14, 07/18/14, AND DR Delton SeeNELSON IS TO READ THIS     Your physician recommends that you schedule a follow-up appointment in: 3 MONTHS WITH DR Delton SeeNELSON

## 2014-07-10 NOTE — Progress Notes (Signed)
Patient ID: Monique Aguilar, female   DOB: 08-14-1965, 48 y.o.   MRN: [id number]    Patient Name: Monique Aguilar Date of Encounter: 07/10/2014  Primary Care Provider:  Holland CommonsKECK, VALERIE, NP Primary Cardiologist:  Lars MassonNELSON, Haidar Muse H  Problem List   Past Medical History  Diagnosis Date  . Microcytic anemia     a. No prior workup.  . Migraines   . Low back pain   . Atypical chest pain     a. 10/2013: suspected GI etiology. Nuc - nonischemic, EF 39% but visually higher - f/u echo to clarify showed EF 50-55% with only mild LVH, no significant valvuar disease.  [name]. HTN (hypertension)   . Abnormal CT of the chest     a. 10/2013: 4mm RML pulm nodule, axillary lymphadenopathy. Instructed to f/u PCP.   Past Surgical History  Procedure Laterality Date  . Tonsillectomy  1992?  [name]. Cesarean section  1988  . Tubal ligation  1995    Allergies  No Known Allergies  HPI  Monique Aguilar is a 48 y.o. female with hypertension who comes in for evaluation of chest discomfort. Patient states today at noon she was sitting down to eat lunch when she began to "feel funny", she reports mild chest discomfort she characterizes as a sharp intermittent pain in the center of her chest that was fleeting and lasted only seconds. The pain did not radiate anywhere and did not have any associated nausea, vomiting, diaphoresis. The patient states that this pain gets worse when she lays down in bed and improves when she sits up or lays on her left side. She would have [place] approximately twice a week. She was hospitalized in March of 204 similar type of pain when before meals as well as ruled out, echocardiogram note showed normal systolic and diastolic function. No valvular abnormality and only mild LVH. [name] has noticed that in the last months her blood pressure has been elevated she has been checking it every day at noon and she has measurements as high as 180/95. She's also complaining of new lower extremity edema and getting  more short of breath on exertion. She has never smoked, her mother had myocardial infarction at age of 147 and ultimately died from congestive heart failure.   07/10/2014 - the patient continues to have chest pain that is exertional associated with SOB. No palpitations or syncope. She gets SOB after walking short distances, like walking here from the parking lot. Her BP is better since starting HCTZ and atenolol. Some dizziness, but otherwise tolerated well. LE edema has improved somewhat but still persists.  . Home Medications  Prior to Admission medications   Medication Sig Start Date End Date Taking? Authorizing Provider  aspirin-acetaminophen-caffeine (EXCEDRIN MIGRAINE) [phone] MG per tablet Take 2 tablets by mouth every 6 (six) hours as needed for migraine.    Yes Historical Provider, MD  irbesartan (AVAPRO) 150 MG tablet Take 1 tablet (150 mg total) by mouth daily. 04/07/14  Yes Ambrose FinlandValerie A Keck, NP  LORazepam (ATIVAN) 0.5 MG tablet  04/07/14  Yes Historical Provider, MD    Family History  Family History  Problem Relation Age of Onset  . Heart disease Father     Dx early 5740's (? heart valve)  . Congestive Heart Failure Mother     Died in her 6570s - CHF   . CAD Neg Hx   . Heart attack Father [id number]    Social History  History   Social  History  . Marital Status: Single    Spouse Name: N/A    Number of Children: N/A  . Years of Education: N/A   Occupational History  . Not on file.   Social History Main Topics  . Smoking status: Never Smoker   . Smokeless tobacco: Never Used  . Alcohol Use: No  . Drug Use: No  . Sexual Activity: Not Currently    Birth Control/ Protection: Surgical   Other Topics Concern  . Not on file   Social History Narrative     Review of Systems, as per HPI, otherwise negative General:  No chills, fever, night sweats or weight changes.  Cardiovascular:  No chest pain, dyspnea on exertion, edema, orthopnea, palpitations, paroxysmal nocturnal  dyspnea. Dermatological: No rash, lesions/masses Respiratory: No cough, dyspnea Urologic: No hematuria, dysuria Abdominal:   No nausea, vomiting, diarrhea, bright red blood per rectum, melena, or hematemesis Neurologic:  No visual changes, wkns, changes in mental status. All other systems reviewed and are otherwise negative except as noted above.  Physical Exam  Blood pressure 128/74, pulse 72, height 5\' 6"  (1.676 m), weight 232 lb (105.235 kg).  General: Pleasant, NAD Psych: Normal affect. Neuro: Alert and oriented X 3. Moves all extremities spontaneously. HEENT: Normal  Neck: Supple without bruits or JVD. Lungs:  Resp regular and unlabored, CTA. Heart: RRR no s3, s4, or murmurs. Abdomen: Soft, non-tender, non-distended, BS + x 4.  Extremities: No clubbing, cyanosis or edema. DP/PT/Radials 2+ and equal bilaterally.  Labs:  No results for input(s): CKTOTAL, CKMB, TROPONINI in the last 72 hours. Lab Results  Component Value Date   WBC 5.6 05/30/2014   HGB 11.5* 05/30/2014   HCT 35.4* 05/30/2014   MCV 81.8 05/30/2014   PLT 305 05/30/2014    Lab Results  Component Value Date   DDIMER 0.51* 03/09/2014   Invalid input(s): POCBNP    Component Value Date/Time   NA 138 05/30/2014 1211   K 4.8 05/30/2014 1211   CL 100 05/30/2014 1211   CO2 24 05/30/2014 1211   GLUCOSE 85 05/30/2014 1211   BUN 11 05/30/2014 1211   CREATININE 1.03 05/30/2014 1211   CREATININE 1.06 04/07/2014 1217   CALCIUM 9.4 05/30/2014 1211   PROT 7.1 04/07/2014 1217   ALBUMIN 3.9 04/07/2014 1217   AST 15 04/07/2014 1217   ALT 14 04/07/2014 1217   ALKPHOS 66 04/07/2014 1217   BILITOT 0.4 04/07/2014 1217   GFRNONAA 63* 05/30/2014 1211   GFRNONAA 62 04/07/2014 1217   GFRAA 73* 05/30/2014 1211   GFRAA 72 04/07/2014 1217   Lab Results  Component Value Date   CHOL 151 10/11/2013   HDL 48 10/11/2013   LDLCALC 71 10/11/2013   TRIG 162* 10/11/2013    Accessory Clinical Findings  Echocardiogram -  10/11/2013  Left ventricle: The cavity size was normal. Wall thickness was increased in a pattern of mild LVH. Systolic function was normal. The estimated ejection fraction was in the range of 50% to 55%. Wall motion was normal; there were no regional wall motion abnormalities. The transmitral flow pattern was normal. The deceleration time of the early transmitral flow velocity was normal. The pulmonary vein flow pattern was normal. The tissue Doppler parameters were normal. Left ventricular diastolic function parameters were normal. ------------------------------------------------------------ Aortic valve: Structurally normal valve. Cusp separation was normal. Doppler: Transvalvular velocity was within the normal range. There was no stenosis. No regurgitation. ------------------------------------------------------------ Aorta: The aorta was normal, not dilated, and non-diseased. ------------------------------------------------------------ Mitral valve:  Structurally normal valve. Leaflet separation was normal. Doppler: Transvalvular velocity was within the normal range. There was no evidence for stenosis. No regurgitation. Peak gradient: 3mm Hg (D). ------------------------------------------------------------ Left atrium: The atrium was normal in size. ------------------------------------------------------------ Right ventricle: The cavity size was normal. Wall thickness was normal. Systolic function was normal. ------------------------------------------------------------ Pulmonic valve: Structurally normal valve. Cusp separation was normal. Doppler: Transvalvular velocity was within the normal range. No regurgitation. ------------------------------------------------------------ Tricuspid valve: Structurally normal valve. Leaflet separation was normal. Doppler: Transvalvular velocity was within the normal range. No regurgitation.  ECG - SR, normal ECG  Lexiscan nuclear stress  test:     Assessment & Plan  A very pleasant 48 year old female  1. Chest pain - exertional, negative stress test, we will order a coronary CT to evaluate for plaque and possible obstructive CAD. Patient has multiple risk factor for coronary artery disease including family history of premature coronary artery disease, hypertension, obesity and physical inactivity.   2. Hypertension - with evidence of mild LVH on her echocardiogram, controlled after adding HCTZ and atenolol.  3. Lipids - mildly elevated triglycerides 162, she has plenty of room for improvement in her diet. She was counseled on proper diet for now no medication. We will start statin if evidence of plaque on coronary CT.  Follow-up in 3 months.    Lars Masson, MD, [facility] 07/10/2014, 4:15 PM

## 2014-07-13 ENCOUNTER — Encounter: Payer: Self-pay | Admitting: Cardiology

## 2014-07-17 ENCOUNTER — Ambulatory Visit (HOSPITAL_COMMUNITY)
Admission: RE | Admit: 2014-07-17 | Discharge: 2014-07-17 | Disposition: A | Discharge status: Home / Self Care | Payer: 59 | Source: Ambulatory Visit | Attending: Cardiology | Admitting: Cardiology

## 2014-07-17 DIAGNOSIS — R599 Enlarged lymph nodes, unspecified: Secondary | ICD-10-CM | POA: Diagnosis not present

## 2014-07-17 DIAGNOSIS — Q245 Malformation of coronary vessels: Secondary | ICD-10-CM | POA: Insufficient documentation

## 2014-07-17 DIAGNOSIS — R911 Solitary pulmonary nodule: Secondary | ICD-10-CM | POA: Insufficient documentation

## 2014-07-17 DIAGNOSIS — I771 Stricture of artery: Secondary | ICD-10-CM | POA: Diagnosis not present

## 2014-07-17 DIAGNOSIS — R079 Chest pain, unspecified: Secondary | ICD-10-CM | POA: Insufficient documentation

## 2014-07-17 MED ORDER — NITROGLYCERIN 0.4 MG SL SUBL
0.4000 mg | SUBLINGUAL_TABLET | SUBLINGUAL | Status: DC | PRN
  Administered 2014-07-17: 0.4 mg via SUBLINGUAL
  Filled 2014-07-17: qty 25

## 2014-07-17 MED ORDER — IOHEXOL 350 MG/ML SOLN
80.0000 mL | Freq: Once | INTRAVENOUS | Status: AC | PRN
  Administered 2014-07-17: 80 mL via INTRAVENOUS

## 2014-07-17 MED ORDER — METOPROLOL TARTRATE 1 MG/ML IV SOLN
5.0000 mg | Freq: Once | INTRAVENOUS | Status: AC
  Administered 2014-07-17: 5 mg via INTRAVENOUS
  Filled 2014-07-17: qty 5

## 2014-07-17 MED ORDER — NITROGLYCERIN 0.4 MG SL SUBL
SUBLINGUAL_TABLET | SUBLINGUAL | Status: AC
  Administered 2014-07-17: 0.4 mg via SUBLINGUAL
  Filled 2014-07-17: qty 2

## 2014-07-17 MED ORDER — METOPROLOL TARTRATE 1 MG/ML IV SOLN
INTRAVENOUS | Status: AC
  Filled 2014-07-17: qty 10

## 2014-07-18 ENCOUNTER — Telehealth: Payer: Self-pay | Admitting: Cardiology

## 2014-07-18 DIAGNOSIS — Q245 Malformation of coronary vessels: Secondary | ICD-10-CM

## 2014-07-18 NOTE — Telephone Encounter (Signed)
Called patient back. Advised for need for GXT. Will send message Dr.Nelson concerning if she needs to hold Atenolol prior to GXT.

## 2014-07-18 NOTE — Telephone Encounter (Signed)
Follow Up    Pt returning call regarding test results. Please call.

## 2014-07-19 NOTE — Telephone Encounter (Signed)
Yes, please let her hold atenolol that day. Also is it possible for her to have GXT the day I am in the clinic?

## 2014-07-19 NOTE — Telephone Encounter (Signed)
Called patient, left message to call back. Sent staff message to have GXT schedule on a day Dr. Delton SeeNelson is in the office.

## 2014-07-24 NOTE — Telephone Encounter (Signed)
Pt has been scheduled for GXT 08/09/2014.

## 2014-08-08 ENCOUNTER — Encounter: Payer: Self-pay | Admitting: Cardiology

## 2014-08-29 ENCOUNTER — Encounter: Payer: 59 | Admitting: Nurse Practitioner

## 2014-09-18 ENCOUNTER — Ambulatory Visit: Payer: 59 | Admitting: Internal Medicine

## 2014-09-22 ENCOUNTER — Ambulatory Visit: Payer: Managed Care, Other (non HMO) | Attending: Internal Medicine | Admitting: Internal Medicine

## 2014-09-22 ENCOUNTER — Encounter: Payer: Self-pay | Admitting: Internal Medicine

## 2014-09-22 VITALS — BP 108/71 | HR 66 | Temp 99.0°F | Resp 16 | Ht 67.0 in | Wt 230.0 lb

## 2014-09-22 DIAGNOSIS — I10 Essential (primary) hypertension: Secondary | ICD-10-CM | POA: Diagnosis present

## 2014-09-22 DIAGNOSIS — Z79899 Other long term (current) drug therapy: Secondary | ICD-10-CM | POA: Diagnosis not present

## 2014-09-22 DIAGNOSIS — R5383 Other fatigue: Secondary | ICD-10-CM | POA: Diagnosis not present

## 2014-09-22 NOTE — Progress Notes (Signed)
Patient ID: Monique Aguilar, female   DOB: [birth year], 49 y.o.   MRN: [id number]  CC: f/u  HPI: Monique Aguilar is a 49 y.o. female here today for a follow up visit.  Patient has past medical history of anemia, atypical chest pain, and HTN.  Patient presents for a 3 month follow up of hypertension and worsening fatigue. She states that since she began taking atenolol for her atrial fibrillation she has had severe fatigue. Patient states that she has discussed this issue with her cardiologist but has been told that she may not come of the medication because it is doing well controlling her heart rate. She has tried taking the medication after work or around dinner time but reports that she wakes up extremely fatigued in the morning. She takes iron supplements.   Patient has No headache, No chest pain, No abdominal pain - No Nausea, No new weakness tingling or numbness, No Cough - SOB.  No Known Allergies Past Medical History  Diagnosis Date  . Microcytic anemia     a. No prior workup.  . Migraines   . Low back pain   . Atypical chest pain     a. 10/2013: suspected GI etiology. Nuc - nonischemic, EF 39% but visually higher - f/u echo to clarify showed EF 50-55% with only mild LVH, no significant valvuar disease.  [name] HTN (hypertension)   . Abnormal CT of the chest     a. 10/2013: 4mm RML pulm nodule, axillary lymphadenopathy. Instructed to f/u PCP.   Current Outpatient Prescriptions on File Prior to Visit  Medication Sig Dispense Refill  . atenolol (TENORMIN) 25 MG tablet Take 1 tablet (25 mg total) by mouth daily. 90 tablet 3  . hydrochlorothiazide (HYDRODIURIL) 25 MG tablet Take 1 tablet (25 mg total) by mouth daily. 90 tablet 3  . irbesartan (AVAPRO) 150 MG tablet Take 1 tablet (150 mg total) by mouth daily. 30 tablet 2  . aspirin-acetaminophen-caffeine (EXCEDRIN MIGRAINE) 250-250-65 MG per tablet Take 2 tablets by mouth every 6 (six) hours as needed for migraine.     [name] LORazepam (ATIVAN) 0.5 MG  tablet      No current facility-administered medications on file prior to visit.   Family History  Problem Relation Age of Onset  . Heart disease Father     Dx early 71's (? heart valve)  . Congestive Heart Failure Mother     Died in her 37s - CHF   . CAD Neg Hx   . Heart attack Father 55   History   Social History  . Marital Status: Single    Spouse Name: N/A  . Number of Children: N/A  . Years of Education: N/A   Occupational History  . Not on file.   Social History Main Topics  . Smoking status: Never Smoker   . Smokeless tobacco: Never Used  . Alcohol Use: No  . Drug Use: No  . Sexual Activity: Not Currently    Birth Control/ Protection: Surgical   Other Topics Concern  . Not on file   Social History Narrative    Review of Systems: See HPI.    Objective:   Filed Vitals:   09/22/14 1525  BP: 108/71  Pulse: 66  Temp: 99 F (37.2 C)  Resp: 16    Physical Exam  Constitutional: She is oriented to person, place, and time.  Neck: No JVD present.  Cardiovascular: Normal rate, regular rhythm and normal heart sounds.   Pulmonary/Chest: Effort normal  and breath sounds normal.  Musculoskeletal: She exhibits no edema.  Neurological: She is alert and oriented to person, place, and time.     Lab Results  Component Value Date   WBC 5.6 05/30/2014   HGB 11.5* 05/30/2014   HCT 35.4* 05/30/2014   MCV 81.8 05/30/2014   PLT 305 05/30/2014   Lab Results  Component Value Date   CREATININE 1.03 05/30/2014   BUN 11 05/30/2014   NA 138 05/30/2014   K 4.8 05/30/2014   CL 100 05/30/2014   CO2 24 05/30/2014    Lab Results  Component Value Date   HGBA1C 5.2 12/16/2012   Lipid Panel     Component Value Date/Time   CHOL 151 10/11/2013 0125   TRIG 162* 10/11/2013 0125   HDL 48 10/11/2013 0125   CHOLHDL 3.1 10/11/2013 0125   VLDL 32 10/11/2013 0125   LDLCALC 71 10/11/2013 0125       Assessment and plan:   Monique Aguilar was seen today for  follow-up.  Diagnoses and all orders for this visit:  Essential hypertension Patient may try to split the tablet in half, taking half in the morning and half in the evening to see if that improves her functioning  Return if symptoms worsen or fail to improve.        Holland CommonsKECK, Jazel Nimmons, NP-C [facility] [phone] 09/22/2014, 4:23 PM

## 2014-09-22 NOTE — Progress Notes (Signed)
Pt is here following up on her HTN and anemia. Pt is requesting lab work b/c she is exhausted and tired all the time.

## 2014-09-22 NOTE — Patient Instructions (Addendum)
Please begin taking 81 mg aspirin    Atenolol tablets What is this medicine? ATENOLOL (a TEN oh lole) is a beta-blocker. Beta-blockers reduce the workload on the heart and help it to beat more regularly. This medicine is used to treat high blood pressure and to prevent chest pain. It is also used to protect the heart during a heart attack and to prevent an additional heart attack from occurring. This medicine may be used for other purposes; ask your health care provider or pharmacist if you have questions. COMMON BRAND NAME(S): Tenormin What should I tell my health care provider before I take this medicine? They need to know if you have any of these conditions: -diabetes -heart or vessel disease like slow heart rate, worsening heart failure, heart block, sick sinus syndrome or Raynaud's disease -kidney disease -lung or breathing disease, like asthma or emphysema -pheochromocytoma -thyroid disease -an unusual or allergic reaction to atenolol, other beta-blockers, medicines, foods, dyes, or preservatives -pregnant or trying to get pregnant -breast-feeding How should I use this medicine? Take this medicine by mouth with a drink of water. Follow the directions on the prescription label. This medicine may be taken with or without food. Take your medicine at regular intervals. Do not take more medicine than directed. Do not stop taking this medicine suddenly. This could lead to serious heart-related effects. Talk to your pediatrician regarding the use of this medicine in children. Special care may be needed. Overdosage: If you think you have taken too much of this medicine contact a poison control center or emergency room at once. NOTE: This medicine is only for you. Do not share this medicine with others. What if I miss a dose? If you miss a dose, take it as soon as you can. If it is almost time for your next dose, take only that dose. Do not take double or extra doses. What may interact with  this medicine? This medicine may interact with the following medications: -certain medicines for blood pressure, heart disease, irregular heart beat -clonidine -digoxin -diuretics -dobutamine -epinephrine -isoproterenol -NSAIDs, medicines for pain and inflammation, like ibuprofen or naproxen -reserpine This list may not describe all possible interactions. Give your health care provider a list of all the medicines, herbs, non-prescription drugs, or dietary supplements you use. Also tell them if you smoke, drink alcohol, or use illegal drugs. Some items may interact with your medicine. What should I watch for while using this medicine? Visit your doctor or health care professional for regular check ups. Check your blood pressure and pulse rate regularly. Ask your health care professional what your blood pressure and pulse rate should be, and when you should contact him or her. You may get drowsy or dizzy. Do not drive, use machinery, or do anything that needs mental alertness until you know how this medicine affects you. Do not stand or sit up quickly. Alcohol may interfere with the effect of this medicine. Avoid alcoholic drinks. This medicine can affect blood sugar levels. If you have diabetes, check with your doctor or health care professional before you change your diet or the dose of your diabetic medicine. Do not treat yourself for coughs, colds, or pain while you are taking this medicine without asking your doctor or health care professional for advice. Some ingredients may increase your blood pressure. What side effects may I notice from receiving this medicine? Side effects that you should report to your doctor or health care professional as soon as possible: -allergic reactions like skin  rash, itching or hives, swelling of the face, lips, or tongue -breathing problems -changes in vision -chest pain -cold, tingling, or numb hands or feet -depression -fast, irregular  heartbeat -feeling faint or lightheaded, falls -fever with sore throat -rapid weight gain -swollen ankles, legs Side effects that usually do not require medical attention (report to your doctor or health care professional if they continue or are bothersome): -anxiety, nervous -diarrhea -dry skin -change in sex drive or performance -headache -nightmares or trouble sleeping -short term memory loss -stomach upset -unusually tired This list may not describe all possible side effects. Call your doctor for medical advice about side effects. You may report side effects to FDA at 1-800-FDA-1088. Where should I keep my medicine? Keep out of the reach of children. Store at room temperature between 20 and 25 degrees C (68 and 77 degrees F). Close tightly and protect from light. Throw away any unused medicine after the expiration date. NOTE: This sheet is a summary. It may not cover all possible information. If you have questions about this medicine, talk to your doctor, pharmacist, or health care provider.  2015, Elsevier/Gold Standard. (2013-03-27 14:21:28)

## 2014-09-26 ENCOUNTER — Encounter (HOSPITAL_COMMUNITY): Payer: Self-pay | Admitting: Emergency Medicine

## 2014-09-26 ENCOUNTER — Emergency Department (INDEPENDENT_AMBULATORY_CARE_PROVIDER_SITE_OTHER)
Admission: EM | Admit: 2014-09-26 | Discharge: 2014-09-26 | Disposition: A | Discharge status: Home / Self Care | Payer: Managed Care, Other (non HMO) | Source: Home / Self Care | Attending: Family Medicine | Admitting: Family Medicine

## 2014-09-26 DIAGNOSIS — J069 Acute upper respiratory infection, unspecified: Secondary | ICD-10-CM

## 2014-09-26 LAB — POCT RAPID STREP A: Streptococcus, Group A Screen (Direct): NEGATIVE

## 2014-09-26 MED ORDER — IPRATROPIUM BROMIDE 0.06 % NA SOLN: 2.0000 | Freq: Four times a day (QID) | NASAL | Status: DC

## 2014-09-26 MED ORDER — PREDNISONE 10 MG PO TABS: 30.0000 mg | ORAL_TABLET | Freq: Every day | ORAL | Status: DC

## 2014-09-26 NOTE — ED Provider Notes (Signed)
Monique Aguilar is a 49 y.o. female who presents to Urgent Care today for sore throat nausea vomiting and chills. Symptoms present for 3 days. Patient denies any cough or congestion. No abdominal pain or chest pain. Patient has tried TheraFlu and hot tea which have helped a little.   Past Medical History  Diagnosis Date  . Microcytic anemia     a. No prior workup.  . Migraines   . Low back pain   . Atypical chest pain     a. 10/2013: suspected GI etiology. Nuc - nonischemic, EF 39% but visually higher - f/u echo to clarify showed EF 50-55% with only mild LVH, no significant valvuar disease.  [name]. HTN (hypertension)   . Abnormal CT of the chest     a. 10/2013: 4mm RML pulm nodule, axillary lymphadenopathy. Instructed to f/u PCP.   Past Surgical History  Procedure Laterality Date  . Tonsillectomy  1992?  [name]. Cesarean section  1988  . Tubal ligation  1995   History  Substance Use Topics  . Smoking status: Never Smoker   . Smokeless tobacco: Never Used  . Alcohol Use: No   ROS as above Medications: No current facility-administered medications for this encounter.   Current Outpatient Prescriptions  Medication Sig Dispense Refill  . aspirin-acetaminophen-caffeine (EXCEDRIN MIGRAINE) 250-250-65 MG per tablet Take 2 tablets by mouth every 6 (six) hours as needed for migraine.     [name]. atenolol (TENORMIN) 25 MG tablet Take 1 tablet (25 mg total) by mouth daily. 90 tablet 3  . hydrochlorothiazide (HYDRODIURIL) 25 MG tablet Take 1 tablet (25 mg total) by mouth daily. 90 tablet 3  . ipratropium (ATROVENT) 0.06 % nasal spray Place 2 sprays into both nostrils 4 (four) times daily. 15 mL 1  . irbesartan (AVAPRO) 150 MG tablet Take 1 tablet (150 mg total) by mouth daily. 30 tablet 2  . LORazepam (ATIVAN) 0.5 MG tablet     . predniSONE (DELTASONE) 10 MG tablet Take 3 tablets (30 mg total) by mouth daily. 15 tablet 0   No Known Allergies   Exam:  BP 120/78 mmHg  Pulse 74  Temp(Src) 98.1 F (36.7 C)  (Oral)  Resp 16  SpO2 100%  LMP 09/05/2014 Gen: Well NAD nontoxic appearing HEENT: EOMI,  MMM . Pharynx is mildly erythematous. Cervical lymphadenopathy is present bilaterally and mild. Lungs: Normal work of breathing. CTABL Heart: RRR no MRG Abd: NABS, Soft. Nondistended, Nontender Exts: Brisk capillary refill, warm and well perfused.   Results for orders placed or performed during the hospital encounter of 09/26/14 (from the past 24 hour(s))  POCT rapid strep A [facility])     Status: None   Collection Time: 09/26/14 10:43 AM  Result Value Ref Range   Streptococcus, Group A Screen (Direct) NEGATIVE NEGATIVE   No results found.  Assessment and Plan: 49 y.o. female with viral URI. Patient is quite symptomatic. Treatment with prednisone and Atrovent nasal spray. Work note provided. Return as needed. Throat culture pending.  Discussed warning signs or symptoms. Please see discharge instructions. Patient expresses understanding.     Monique BongEvan S Mabrey Howland, MD 09/26/14 [phone]

## 2014-09-26 NOTE — Discharge Instructions (Signed)
Thank you for coming in today. °Call or go to the emergency room if you get worse, have trouble breathing, have chest pains, or palpitations.  ° ° °Upper Respiratory Infection, Adult °An upper respiratory infection (URI) is also sometimes known as the common cold. The upper respiratory tract includes the nose, sinuses, throat, trachea, and bronchi. Bronchi are the airways leading to the lungs. Most people improve within 1 week, but symptoms can last up to 2 weeks. A residual cough may last even longer.  °CAUSES °Many different viruses can infect the tissues lining the upper respiratory tract. The tissues become irritated and inflamed and often become very moist. Mucus production is also common. A cold is contagious. You can easily spread the virus to others by oral contact. This includes kissing, sharing a glass, coughing, or sneezing. Touching your mouth or nose and then touching a surface, which is then touched by another person, can also spread the virus. °SYMPTOMS  °Symptoms typically develop 1 to 3 days after you come in contact with a cold virus. Symptoms vary from person to person. They may include: °· Runny nose. °· Sneezing. °· Nasal congestion. °· Sinus irritation. °· Sore throat. °· Loss of voice (laryngitis). °· Cough. °· Fatigue. °· Muscle aches. °· Loss of appetite. °· Headache. °· Low-grade fever. °DIAGNOSIS  °You might diagnose your own cold based on familiar symptoms, since most people get a cold 2 to 3 times a year. Your caregiver can confirm this based on your exam. Most importantly, your caregiver can check that your symptoms are not due to another disease such as strep throat, sinusitis, pneumonia, asthma, or epiglottitis. Blood tests, throat tests, and X-rays are not necessary to diagnose a common cold, but they may sometimes be helpful in excluding other more serious diseases. Your caregiver will decide if any further tests are required. °RISKS AND COMPLICATIONS  °You may be at risk for a more  severe case of the common cold if you smoke cigarettes, have chronic heart disease (such as heart failure) or lung disease (such as asthma), or if you have a weakened immune system. The very young and very old are also at risk for more serious infections. Bacterial sinusitis, middle ear infections, and bacterial pneumonia can complicate the common cold. The common cold can worsen asthma and chronic obstructive pulmonary disease (COPD). Sometimes, these complications can require emergency medical care and may be life-threatening. °PREVENTION  °The best way to protect against getting a cold is to practice good hygiene. Avoid oral or hand contact with people with cold symptoms. Wash your hands often if contact occurs. There is no clear evidence that vitamin C, vitamin E, echinacea, or exercise reduces the chance of developing a cold. However, it is always recommended to get plenty of rest and practice good nutrition. °TREATMENT  °Treatment is directed at relieving symptoms. There is no cure. Antibiotics are not effective, because the infection is caused by a virus, not by bacteria. Treatment may include: °· Increased fluid intake. Sports drinks offer valuable electrolytes, sugars, and fluids. °· Breathing heated mist or steam (vaporizer or shower). °· Eating chicken soup or other clear broths, and maintaining good nutrition. °· Getting plenty of rest. °· Using gargles or lozenges for comfort. °· Controlling fevers with ibuprofen or acetaminophen as directed by your caregiver. °· Increasing usage of your inhaler if you have asthma. °Zinc gel and zinc lozenges, taken in the first 24 hours of the common cold, can shorten the duration and lessen the severity   of symptoms. Pain medicines may help with fever, muscle aches, and throat pain. A variety of non-prescription medicines are available to treat congestion and runny nose. Your caregiver can make recommendations and may suggest nasal or lung inhalers for other symptoms.    °HOME CARE INSTRUCTIONS  °· Only take over-the-counter or prescription medicines for pain, discomfort, or fever as directed by your caregiver. °· Use a warm mist humidifier or inhale steam from a shower to increase air moisture. This may keep secretions moist and make it easier to breathe. °· Drink enough water and fluids to keep your urine clear or pale yellow. °· Rest as needed. °· Return to work when your temperature has returned to normal or as your caregiver advises. You may need to stay home longer to avoid infecting others. You can also use a face mask and careful hand washing to prevent spread of the virus. °SEEK MEDICAL CARE IF:  °· After the first few days, you feel you are getting worse rather than better. °· You need your caregiver's advice about medicines to control symptoms. °· You develop chills, worsening shortness of breath, or brown or red sputum. These may be signs of pneumonia. °· You develop yellow or brown nasal discharge or pain in the face, especially when you bend forward. These may be signs of sinusitis. °· You develop a fever, swollen neck glands, pain with swallowing, or white areas in the back of your throat. These may be signs of strep throat. °SEEK IMMEDIATE MEDICAL CARE IF:  °· You have a fever. °· You develop severe or persistent headache, ear pain, sinus pain, or chest pain. °· You develop wheezing, a prolonged cough, cough up blood, or have a change in your usual mucus (if you have chronic lung disease). °· You develop sore muscles or a stiff neck. °Document Released: 01/14/2001 Document Revised: 10/13/2011 Document Reviewed: 10/26/2013 °ExitCare® Patient Information ©2015 [employer]. This information is not intended to replace advice given to you by your health care provider. Make sure you discuss any questions you have with your health care provider. ° °

## 2014-09-26 NOTE — ED Notes (Signed)
Sore throat, nausea, chills, onset 3 days ago

## 2014-09-28 LAB — CULTURE, GROUP A STREP: Strep A Culture: NEGATIVE

## 2014-10-16 ENCOUNTER — Ambulatory Visit (INDEPENDENT_AMBULATORY_CARE_PROVIDER_SITE_OTHER): Payer: Managed Care, Other (non HMO) | Admitting: Cardiology

## 2014-10-16 ENCOUNTER — Encounter: Payer: Self-pay | Admitting: Cardiology

## 2014-10-16 DIAGNOSIS — I209 Angina pectoris, unspecified: Secondary | ICD-10-CM

## 2014-10-16 DIAGNOSIS — Q245 Malformation of coronary vessels: Secondary | ICD-10-CM

## 2014-10-16 DIAGNOSIS — I10 Essential (primary) hypertension: Secondary | ICD-10-CM

## 2014-10-16 DIAGNOSIS — E785 Hyperlipidemia, unspecified: Secondary | ICD-10-CM

## 2014-10-16 NOTE — Patient Instructions (Signed)
Your physician recommends that you continue on your current medications as directed. Please refer to the Current Medication list given to you today.    Your physician has requested that you have an exercise tolerance test. For further information please visit [email or web address]. Please also follow instruction sheet, as given.     Your physician wants you to follow-up in: 6 MONTHS WITH DR Johnell ComingsNELSON You will receive a reminder letter in the mail two months in advance. If you don't receive a letter, please call our office to schedule the follow-up appointment.

## 2014-10-16 NOTE — Progress Notes (Signed)
Patient ID: Monique Lintathy A Markwood, female   DOB: 01-05-1966, 49 y.o.   MRN: [id number]    Patient Name: Monique Aguilar Date of Encounter: 10/16/2014  Primary Care Provider:  Holland CommonsKECK, VALERIE, NP Primary Cardiologist:  Lars MassonNELSON, Kaena Santori H  Problem List   Past Medical History  Diagnosis Date  . Microcytic anemia     a. No prior workup.  . Migraines   . Low back pain   . Atypical chest pain     a. 10/2013: suspected GI etiology. Nuc - nonischemic, EF 39% but visually higher - f/u echo to clarify showed EF 50-55% with only mild LVH, no significant valvuar disease.  [name]. HTN (hypertension)   . Abnormal CT of the chest     a. 10/2013: 4mm RML pulm nodule, axillary lymphadenopathy. Instructed to f/u PCP.   Past Surgical History  Procedure Laterality Date  . Tonsillectomy  1992?  [name]. Cesarean section  1988  . Tubal ligation  1995    Allergies  No Known Allergies  HPI  Monique Aguilar is a 49 y.o. female with hypertension who comes in for evaluation of chest discomfort. Patient states today at noon she was sitting down to eat lunch when she began to "feel funny", she reports mild chest discomfort she characterizes as a sharp intermittent pain in the center of her chest that was fleeting and lasted only seconds. The pain did not radiate anywhere and did not have any associated nausea, vomiting, diaphoresis. The patient states that this pain gets worse when she lays down in bed and improves when she sits up or lays on her left side. She would have D [place] approximately twice a week. She was hospitalized in March of 204 similar type of pain when before meals as well as ruled out, echocardiogram note showed normal systolic and diastolic function. No valvular abnormality and only mild LVH. [name] has noticed that in the last months her blood pressure has been elevated she has been checking it every day at noon and she has measurements as high as 180/95. She's also complaining of new lower extremity edema and getting  more short of breath on exertion. She has never smoked, her mother had myocardial infarction at age of 50 and ultimately died from congestive heart failure.   07/10/2014 - the patient continues to have chest pain that is exertional associated with SOB. No palpitations or syncope. She gets SOB after walking short distances, like walking here from the parking lot. Her BP is better since starting HCTZ and atenolol. Some dizziness, but otherwise tolerated well. LE edema has improved somewhat but still persists.  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 The patient was advised to undergo an exercise treadmill stress test, but wanted to talk to me first. She continues to have exertional chest pain and SOB.  . Home Medications  Prior to Admission medications   Medication Sig Start Date End Date Taking? Authorizing Provider  aspirin-acetaminophen-caffeine (EXCEDRIN MIGRAINE) [id number] MG per tablet Take 2 tablets by mouth every 6 (six) hours as needed for migraine.    Yes Historical Provider, MD  irbesartan (AVAPRO) 150 MG tablet Take 1 tablet (150 mg total) by mouth daily. 04/07/14  Yes Ambrose FinlandValerie A Keck, NP  LORazepam (ATIVAN) 0.5 MG tablet  04/07/14  Yes Historical Provider, MD  Family History  Family History  Problem Relation Age of Onset  . Heart disease Father     Dx early 63's (? heart valve)  . Congestive Heart Failure Mother   . CAD Neg Hx   . Heart attack Father 48    Social History  History   Social History  . Marital Status: Single    Spouse Name: N/A  . Number of Children: N/A  . Years of Education: N/A   Occupational History  . Not on file.   Social History Main Topics  . Smoking status: Never Smoker   . Smokeless tobacco: Never Used  . Alcohol Use: No  . Drug Use: No  .  Sexual Activity: Not Currently    Birth Control/ Protection: Surgical   Other Topics Concern  . Not on file   Social History Narrative     Review of Systems, as per HPI, otherwise negative General:  No chills, fever, night sweats or weight changes.  Cardiovascular:  No chest pain, dyspnea on exertion, edema, orthopnea, palpitations, paroxysmal nocturnal dyspnea. Dermatological: No rash, lesions/masses Respiratory: No cough, dyspnea Urologic: No hematuria, dysuria Abdominal:   No nausea, vomiting, diarrhea, bright red blood per rectum, melena, or hematemesis Neurologic:  No visual changes, wkns, changes in mental status. All other systems reviewed and are otherwise negative except as noted above.  Physical Exam  Last menstrual period 09/08/2014.  General: Pleasant, NAD Psych: Normal affect. Neuro: Alert and oriented X 3. Moves all extremities spontaneously. HEENT: Normal  Neck: Supple without bruits or JVD. Lungs:  Resp regular and unlabored, CTA. Heart: RRR no s3, s4, or murmurs. Abdomen: Soft, non-tender, non-distended, BS + x 4.  Extremities: No clubbing, cyanosis or edema. DP/PT/Radials 2+ and equal bilaterally.  Labs:  No results for input(s): CKTOTAL, CKMB, TROPONINI in the last 72 hours. Lab Results  Component Value Date   WBC 5.6 05/30/2014   HGB 11.5* 05/30/2014   HCT 35.4* 05/30/2014   MCV 81.8 05/30/2014   PLT 305 05/30/2014    Lab Results  Component Value Date   DDIMER 0.51* 03/09/2014   Invalid input(s): POCBNP    Component Value Date/Time   NA 138 05/30/2014 1211   K 4.8 05/30/2014 1211   CL 100 05/30/2014 1211   CO2 24 05/30/2014 1211   GLUCOSE 85 05/30/2014 1211   BUN 11 05/30/2014 1211   CREATININE 1.03 05/30/2014 1211   CREATININE 1.06 04/07/2014 1217   CALCIUM 9.4 05/30/2014 1211   PROT 7.1 04/07/2014 1217   ALBUMIN 3.9 04/07/2014 1217   AST 15 04/07/2014 1217   ALT 14 04/07/2014 1217   ALKPHOS 66 04/07/2014 1217   BILITOT 0.4  04/07/2014 1217   GFRNONAA 63* 05/30/2014 1211   GFRNONAA 62 04/07/2014 1217   GFRAA 73* 05/30/2014 1211   GFRAA 72 04/07/2014 1217   Lab Results  Component Value Date   CHOL 151 10/11/2013   HDL 48 10/11/2013   LDLCALC 71 10/11/2013   TRIG 162* 10/11/2013    Accessory Clinical Findings  Echocardiogram - 10/11/2013  Left ventricle: The cavity size was normal. Wall thickness was increased in a pattern of mild LVH. Systolic function was normal. The estimated ejection fraction was in the range of 50% to 55%. Wall motion was normal; there were no regional wall motion abnormalities. The transmitral flow pattern was normal. The deceleration time of the early transmitral flow velocity was normal. The pulmonary vein flow pattern was normal. The tissue Doppler parameters were normal. Left  ventricular diastolic function parameters were normal. ------------------------------------------------------------ Aortic valve: Structurally normal valve. Cusp separation was normal. Doppler: Transvalvular velocity was within the normal range. There was no stenosis. No regurgitation. ------------------------------------------------------------ Aorta: The aorta was normal, not dilated, and non-diseased. ------------------------------------------------------------ Mitral valve: Structurally normal valve. Leaflet separation was normal. Doppler: Transvalvular velocity was within the normal range. There was no evidence for stenosis. No regurgitation. Peak gradient: 3mm Hg (D). ------------------------------------------------------------ Left atrium: The atrium was normal in size. ------------------------------------------------------------ Right ventricle: The cavity size was normal. Wall thickness was normal. Systolic function was normal. ------------------------------------------------------------ Pulmonic valve: Structurally normal valve. Cusp separation was normal. Doppler: Transvalvular  velocity was within the normal range. No regurgitation. ------------------------------------------------------------ Tricuspid valve: Structurally normal valve. Leaflet separation was normal. Doppler: Transvalvular velocity was within the normal range. No regurgitation.  ECG - SR, normal ECG  Coronary CTA: 07/17/2014  Coronary Arteries: Right dominance. 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  Left main is a large caliber vessel that originates in the left sinus of Valsalva. There is no plaque. LM gives rise to LAD and LCX artery. LAD is a large caliber vessel that wraps around the apex and give rise to one moderate caliber diagonal branch. There is no plaque in LAD/D1 territory.  LCX is a moderate caliber non-dominant vessel that gives rise to a large tortuous obtuse marginal branch. No plaque was seen in LCX/OM1.  RCA is a large caliber dominant vessel that originates high above the left sinus of Valsalva (24 mm above the aortic annulus). RCA runs to the right in between aorta and main pulmonary artery just slightly above the pulmonary valve. There is no stenosis seen in the ostial portion in diastole when the images were acquired. However, this was a prospective study and images in systole are not available.  The remaining portion of the RCA has normal course and no plaque. PDA and PLVB have no plaque.  IMPRESSION: 1. Coronary calcium score of 0. This was 0 percentile for age and sex matched control.  2. 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 Systolic inter-arterial compression can't be excluded on this study.  3. Right dominance. No evidence of CAD.  An exercise treadmill stress test will be scheduled to evaluate for exertional ischemia - ECG changes and symptoms suggestive of systolic inter-arterial compression.  Devin Going nuclear stress test:     Assessment & Plan  A very pleasant 49 year old female  1. Chest pain - coronary CT showed anomalous RCA originating high above the left sinus of Valsalva and running between aorta and main pulmonary artery. The patient will be scheduled for an exercise treadmill stress test to rule out ischemia caused by inter-arterial compression.   2. Hypertension - with evidence of mild LVH on her echocardiogram, controlled after adding HCTZ and atenolol.  3. Lipids - mildly elevated triglycerides 162, she has plenty of room for improvement in her diet. She was counseled on proper diet for now no medication. We will start statin if evidence of plaque on coronary CT.  Follow-up in after GXT. [name]    Lars Masson, MD, [facility] 10/16/2014, 4:32 PM

## 2014-11-22 ENCOUNTER — Other Ambulatory Visit: Payer: Self-pay | Admitting: Internal Medicine

## 2014-12-01 ENCOUNTER — Other Ambulatory Visit: Payer: Self-pay | Admitting: Internal Medicine

## 2014-12-01 ENCOUNTER — Ambulatory Visit (INDEPENDENT_AMBULATORY_CARE_PROVIDER_SITE_OTHER): Payer: Managed Care, Other (non HMO) | Admitting: Physician Assistant

## 2014-12-01 DIAGNOSIS — Q245 Malformation of coronary vessels: Secondary | ICD-10-CM

## 2014-12-01 DIAGNOSIS — R079 Chest pain, unspecified: Secondary | ICD-10-CM

## 2014-12-01 NOTE — Progress Notes (Signed)
Exercise Treadmill Test  Pre-Exercise Testing Evaluation Rhythm: normal sinus  Rate: 82 bpm     Test  Exercise Tolerance Test Ordering MD: Monique Nelson, MD  Interpreting MD: Monique NTobias AlexanderewcomerScott Marco Adelson, PA-C  Unique Test No: 1  Treadmill:  1  Indication for ETT: chest pain - rule out ischemia  Contraindication to ETT: No   Stress Modality: exercise - treadmill  Cardiac Imaging Performed: non   Protocol: standard Bruce - maximal  Max BP:  141/84 **BP cuff error during exercise  Max MPHR (bpm):  171 85% MPR (bpm):  145  MPHR obtained (bpm):  160 % MPHR obtained:  93  Reached 85% MPHR (min:sec):  2:29 Total Exercise Time (min-sec):  3:44  Workload in METS:  5.4 Borg Scale: 17  Reason ETT Terminated:  fatigue; dyspnea    ST Segment Analysis At Rest: normal ST segments - no evidence of significant ST depression With Exercise: no evidence of significant ST depression  Other Information Arrhythmia:  No Angina during ETT:  absent (0) Quality of ETT:  diagnostic  ETT Interpretation:  normal - no evidence of ischemia by ST analysis  Comments: Poor exercise capacity. No chest pain.  Patient did complain of dyspnea. Normal BP response to exercise.  BP cuff error during exercise - BP taken immediately post exercise. No ST changes to suggest ischemia.  O2 sat 99% at peak exercise.  Recommendations: FU with Dr. Tobias AlexanderKatarina Aguilar as directed. Signed,  Monique NewcomerScott Vollie Brunty, PA-C   12/01/2014 11:02 AM

## 2014-12-05 LAB — EXERCISE TOLERANCE TEST
Estimated workload: 5.4 METS
Peak HR: 160 {beats}/min
Percent of predicted max HR: 93 %
Stage 1 DBP: 75 mmHg
Stage 1 Grade: 0 %
Stage 1 HR: 108 {beats}/min
Stage 1 SBP: 122 mmHg
Stage 1 Speed: 0 mph
Stage 2 Grade: 0 %
Stage 2 HR: 122 {beats}/min
Stage 2 Speed: 1 mph
Stage 3 Grade: 0 %
Stage 3 HR: 123 {beats}/min
Stage 3 Speed: 1 mph
Stage 4 Grade: 10 %
Stage 4 HR: 146 {beats}/min
Stage 4 Speed: 1.7 mph
Stage 5 Grade: 12 %
Stage 5 HR: 160 {beats}/min
Stage 5 Speed: 2.5 mph
Stage 6 DBP: 96 mmHg
Stage 6 Grade: 0 %
Stage 6 HR: 137 {beats}/min
Stage 6 SBP: 134 mmHg
Stage 6 Speed: 0 mph
Stage 7 DBP: 78 mmHg
Stage 7 Grade: 0 %
Stage 7 HR: 92 {beats}/min
Stage 7 SBP: 113 mmHg
Stage 7 Speed: 0 mph

## 2014-12-11 ENCOUNTER — Telehealth: Payer: Self-pay | Admitting: Cardiology

## 2014-12-11 NOTE — Telephone Encounter (Signed)
New message ° ° ° ° °Returning a nurses call to get lab results °

## 2014-12-11 NOTE — Telephone Encounter (Signed)
Returned call to patient stress test results given.Advised to call back if she has any chest pain,sob,syncope.Advised to follow up with Dr.Nelson in 6 months.

## 2014-12-12 ENCOUNTER — Ambulatory Visit: Payer: Managed Care, Other (non HMO) | Admitting: Internal Medicine

## 2014-12-18 ENCOUNTER — Ambulatory Visit: Payer: Managed Care, Other (non HMO) | Admitting: Internal Medicine

## 2015-01-03 ENCOUNTER — Encounter: Payer: Self-pay | Admitting: Internal Medicine

## 2015-01-03 ENCOUNTER — Ambulatory Visit: Payer: Managed Care, Other (non HMO) | Attending: Internal Medicine | Admitting: Internal Medicine

## 2015-01-03 VITALS — BP 109/76 | HR 99 | Temp 98.8°F | Resp 18 | Ht 66.0 in | Wt 227.8 lb

## 2015-01-03 DIAGNOSIS — Z7982 Long term (current) use of aspirin: Secondary | ICD-10-CM | POA: Diagnosis not present

## 2015-01-03 DIAGNOSIS — F411 Generalized anxiety disorder: Secondary | ICD-10-CM

## 2015-01-03 DIAGNOSIS — F419 Anxiety disorder, unspecified: Secondary | ICD-10-CM | POA: Diagnosis not present

## 2015-01-03 DIAGNOSIS — I10 Essential (primary) hypertension: Secondary | ICD-10-CM | POA: Insufficient documentation

## 2015-01-03 MED ORDER — SERTRALINE HCL 25 MG PO TABS: 25.0000 mg | ORAL_TABLET | Freq: Every day | ORAL | Status: DC

## 2015-01-03 NOTE — Progress Notes (Signed)
Patient here for follow up for hypertension. Patient denies any pain. Patient would like to talk about her anxiety. Patient reports that it is not getting any better with the use of Ativan. Patient takes Ativan as needed.

## 2015-01-03 NOTE — Patient Instructions (Signed)
Generalized Anxiety Disorder Generalized anxiety disorder (GAD) is a mental disorder. It interferes with life functions, including relationships, work, and school. GAD is different from normal anxiety, which everyone experiences at some point in their lives in response to specific life events and activities. Normal anxiety actually helps us prepare for and get through these life events and activities. Normal anxiety goes away after the event or activity is over.  GAD causes anxiety that is not necessarily related to specific events or activities. It also causes excess anxiety in proportion to specific events or activities. The anxiety associated with GAD is also difficult to control. GAD can vary from mild to severe. People with severe GAD can have intense waves of anxiety with physical symptoms (panic attacks).  SYMPTOMS The anxiety and worry associated with GAD are difficult to control. This anxiety and worry are related to many life events and activities and also occur more days than not for 6 months or longer. People with GAD also have three or more of the following symptoms (one or more in children):  Restlessness.   Fatigue.  Difficulty concentrating.   Irritability.  Muscle tension.  Difficulty sleeping or unsatisfying sleep. DIAGNOSIS GAD is diagnosed through an assessment by your health care provider. Your health care provider will ask you questions aboutyour mood,physical symptoms, and events in your life. Your health care provider may ask you about your medical history and use of alcohol or drugs, including prescription medicines. Your health care provider may also do a physical exam and blood tests. Certain medical conditions and the use of certain substances can cause symptoms similar to those associated with GAD. Your health care provider may refer you to a mental health specialist for further evaluation. TREATMENT The following therapies are usually used to treat GAD:    Medication. Antidepressant medication usually is prescribed for long-term daily control. Antianxiety medicines may be added in severe cases, especially when panic attacks occur.   Talk therapy (psychotherapy). Certain types of talk therapy can be helpful in treating GAD by providing support, education, and guidance. A form of talk therapy called cognitive behavioral therapy can teach you healthy ways to think about and react to daily life events and activities.  Stress managementtechniques. These include yoga, meditation, and exercise and can be very helpful when they are practiced regularly. A mental health specialist can help determine which treatment is best for you. Some people see improvement with one therapy. However, other people require a combination of therapies. Document Released: 11/15/2012 Document Revised: 12/05/2013 Document Reviewed: 11/15/2012 [employer] Patient Information 2015 [employer]. This information is not intended to replace advice given to you by your health care provider. Make sure you discuss any questions you have with your health care provider.  

## 2015-01-03 NOTE — Progress Notes (Signed)
Patient ID: Monique Aguilar, female   DOB: 1965-08-14, 49 y.o.   MRN: [id number] Subjective:  Monique Aguilar is a 49 y.o. female with hypertension and anxiety.   Current Outpatient Prescriptions  Medication Sig Dispense Refill  . aspirin 81 MG chewable tablet Chew by mouth daily.    [name]. aspirin-acetaminophen-caffeine (EXCEDRIN MIGRAINE) 250-250-65 MG per tablet Take 2 tablets by mouth every 6 (six) hours as needed for migraine.     [name]. atenolol (TENORMIN) 25 MG tablet Take 1 tablet (25 mg total) by mouth daily. 90 tablet 3  . hydrochlorothiazide (HYDRODIURIL) 25 MG tablet Take 1 tablet (25 mg total) by mouth daily. 90 tablet 3  . ipratropium (ATROVENT) 0.06 % nasal spray Place 2 sprays into both nostrils 4 (four) times daily. 15 mL 1  . irbesartan (AVAPRO) 150 MG tablet Take 1 tablet (150 mg total) by mouth daily. 30 tablet 2  . LORazepam (ATIVAN) 0.5 MG tablet Take 0.5 mg by mouth as needed.     . predniSONE (DELTASONE) 10 MG tablet Take 3 tablets (30 mg total) by mouth daily. (Patient not taking: Reported on 01/03/2015) 15 tablet 0   No current facility-administered medications for this visit.    Hypertension ROS: taking medications as instructed, no medication side effects noted, no TIA's, no chest pain on exertion, no dyspnea on exertion and no swelling of ankles.  New concerns: Patient reports that she has continued to suffer from anxiety. She has continued to take ativan as needed but now feels like it is not helping her anxiety. She was prescribed Lexapro by me last year but has not taken in since prescribed. She states that the Lexapro made her feel depressed. She is interested in seeking further treatment. Anxiety mostly noted when driving on the highway or when listening to other people's stress. She has noticed GI and sleep disturbances.   Objective:  BP 109/76 mmHg  Pulse 99  Temp(Src) 98.8 F (37.1 C) (Oral)  Resp 18  Ht 5\' 6"  (1.676 m)  Wt 227 lb 12.8 oz (103.329 kg)  BMI 36.79 kg/m2   SpO2 99%  LMP 12/22/2014  Appearance alert, well appearing, and in no distress, oriented to person, place, and time and overweight. General exam BP noted to be well controlled today in office, S1, S2 normal, no gallop, no murmur, chest clear, no JVD, no HSM, no edema.  Lab review: labs are reviewed, up to date and normal.   Assessment:   Hypertension well controlled.  Anxiety: begin on zoloft 25 mg daily and f/u in 6-8 weeks.   Plan:  Current treatment plan is effective, no change in therapy. Reviewed diet, exercise and weight control. Recommended sodium restriction.[name]. k

## 2015-02-23 ENCOUNTER — Ambulatory Visit: Payer: Managed Care, Other (non HMO) | Admitting: Internal Medicine

## 2015-04-16 ENCOUNTER — Telehealth: Payer: Self-pay | Admitting: Cardiology

## 2015-04-16 ENCOUNTER — Encounter: Payer: Self-pay | Admitting: *Deleted

## 2015-04-16 ENCOUNTER — Ambulatory Visit (INDEPENDENT_AMBULATORY_CARE_PROVIDER_SITE_OTHER): Payer: Managed Care, Other (non HMO) | Admitting: Cardiology

## 2015-04-16 ENCOUNTER — Telehealth: Payer: Self-pay | Admitting: Physician Assistant

## 2015-04-16 VITALS — BP 115/70 | HR 74 | Ht 66.0 in | Wt 227.0 lb

## 2015-04-16 DIAGNOSIS — E785 Hyperlipidemia, unspecified: Secondary | ICD-10-CM

## 2015-04-16 DIAGNOSIS — I509 Heart failure, unspecified: Secondary | ICD-10-CM | POA: Diagnosis not present

## 2015-04-16 DIAGNOSIS — Q245 Malformation of coronary vessels: Secondary | ICD-10-CM

## 2015-04-16 DIAGNOSIS — I10 Essential (primary) hypertension: Secondary | ICD-10-CM | POA: Diagnosis not present

## 2015-04-16 DIAGNOSIS — I209 Angina pectoris, unspecified: Secondary | ICD-10-CM

## 2015-04-16 LAB — TROPONIN I: Troponin I: <0.01 ng/mL (ref <0.06)

## 2015-04-16 MED ORDER — FUROSEMIDE 40 MG PO TABS: 40.0000 mg | ORAL_TABLET | Freq: Every day | ORAL | Status: DC

## 2015-04-16 NOTE — Telephone Encounter (Signed)
49 yo with anamalous RCA takeoff and negative myoview, Dr. Lindaann Slough ordered stat trop which is negative.   Ramond Dial PA Pager: [phone]

## 2015-04-16 NOTE — Progress Notes (Signed)
Patient ID: Monique Aguilar, female   DOB: 1966-07-31, 49 y.o.   MRN: [id number]    Patient Name: Monique Aguilar Date of Encounter: 04/16/2015  Primary Care Provider:  Ambrose Finland, NP Primary Cardiologist:  Lars Masson  Problem List   Past Medical History  Diagnosis Date  . Microcytic anemia     a. No prior workup.  . Migraines   . Low back pain   . Atypical chest pain     a. 10/2013: suspected GI etiology. Nuc - nonischemic, EF 39% but visually higher - f/u echo to clarify showed EF 50-55% with only mild LVH, no significant valvuar disease.  [name] HTN (hypertension)   . Abnormal CT of the chest     a. 10/2013: 4mm RML pulm nodule, axillary lymphadenopathy. Instructed to f/u PCP.   Past Surgical History  Procedure Laterality Date  . Tonsillectomy  1992?  [name] Cesarean section  1988  . Tubal ligation  1995    Allergies  No Known Allergies  HPI  Monique Aguilar is a 49 y.o. female with hypertension who comes in for evaluation of chest discomfort. Patient states today at noon she was sitting down to eat lunch when she began to "feel funny", she reports mild chest discomfort she characterizes as a sharp intermittent pain in the center of her chest that was fleeting and lasted only seconds. The pain did not radiate anywhere and did not have any associated nausea, vomiting, diaphoresis. The patient states that this pain gets worse when she lays down in bed and improves when she sits up or lays on her left side. She would have D [place] approximately twice a week. She was hospitalized in March of 204 similar type of pain when before meals as well as ruled out, echocardiogram note showed normal systolic and diastolic function. No valvular abnormality and only mild LVH. Evette Cristal has noticed that in the last months her blood pressure has been elevated she has been checking it every day at noon and she has measurements as high as 180/95. She's also complaining of new lower extremity edema and getting  more short of breath on exertion. She has never smoked, her mother had myocardial infarction at age of 72 and ultimately died from congestive heart failure.   07/10/2014 - the patient continues to have chest pain that is exertional associated with SOB. No palpitations or syncope. She gets SOB after walking short distances, like walking here from the parking lot. Her BP is better since starting HCTZ and atenolol. Some dizziness, but otherwise tolerated well. LE edema has improved somewhat but still persists.  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 The patient was advised to undergo an exercise treadmill stress test, but wanted to talk to me first. She continues to have exertional chest pain and SOB.  04/16/2015 - 6 months follow up, she complains of worsening exertional dyspnea, exertional left hand numbness, worsening LE edema, weight gain, 3 pillow orthopnea. No palpitations or syncope. No chest pain.  . Home Medications  Prior to Admission medications   Medication Sig Start Date End Date Taking? Authorizing Provider  aspirin-acetaminophen-caffeine (EXCEDRIN MIGRAINE) [phone] MG per tablet Take 2 tablets by mouth every 6 (six) hours as needed for migraine.    Yes Historical Provider, MD  irbesartan (  AVAPRO) 150 MG tablet Take 1 tablet (150 mg total) by mouth daily. 04/07/14  Yes Ambrose Finland, NP  LORazepam (ATIVAN) 0.5 MG tablet  04/07/14  Yes Historical Provider, MD    Family History  Family History  Problem Relation Age of Onset  . Heart disease Father     Dx early 71's (? heart valve)  . Congestive Heart Failure Mother   . CAD Neg Hx   . Heart attack Father 53    Social History  Social History   Social History  . Marital Status: Single    Spouse Name: N/A  . Number of Children:  N/A  . Years of Education: N/A   Occupational History  . Not on file.   Social History Main Topics  . Smoking status: Never Smoker   . Smokeless tobacco: Never Used  . Alcohol Use: No  . Drug Use: No  . Sexual Activity: Not Currently    Birth Control/ Protection: Surgical   Other Topics Concern  . Not on file   Social History Narrative     Review of Systems, as per HPI, otherwise negative General:  No chills, fever, night sweats or weight changes.  Cardiovascular:  No chest pain, dyspnea on exertion, edema, orthopnea, palpitations, paroxysmal nocturnal dyspnea. Dermatological: No rash, lesions/masses Respiratory: No cough, dyspnea Urologic: No hematuria, dysuria Abdominal:   No nausea, vomiting, diarrhea, bright red blood per rectum, melena, or hematemesis Neurologic:  No visual changes, wkns, changes in mental status. All other systems reviewed and are otherwise negative except as noted above.  Physical Exam  Blood pressure 115/70, pulse 74, height 5\' 6"  (1.676 m), weight 227 lb (102.967 kg).  General: Pleasant, NAD Psych: Normal affect. Neuro: Alert and oriented X 3. Moves all extremities spontaneously. HEENT: Normal  Neck: Supple without bruits or JVD. Lungs:  Resp regular and unlabored, CTA. Heart: RRR no s3, s4, or murmurs. Abdomen: Soft, non-tender, non-distended, BS + x 4.  Extremities: No clubbing, cyanosis or edema. DP/PT/Radials 2+ and equal bilaterally.  Labs:  No results for input(s): CKTOTAL, CKMB, TROPONINI in the last 72 hours. Lab Results  Component Value Date   WBC 5.6 05/30/2014   HGB 11.5* 05/30/2014   HCT 35.4* 05/30/2014   MCV 81.8 05/30/2014   PLT 305 05/30/2014    Lab Results  Component Value Date   DDIMER 0.51* 03/09/2014   Invalid input(s): POCBNP    Component Value Date/Time   NA 138 05/30/2014 1211   K 4.8 05/30/2014 1211   CL 100 05/30/2014 1211   CO2 24 05/30/2014 1211   GLUCOSE 85 05/30/2014 1211   BUN 11 05/30/2014 1211     CREATININE 1.03 05/30/2014 1211   CREATININE 1.06 04/07/2014 1217   CALCIUM 9.4 05/30/2014 1211   PROT 7.1 04/07/2014 1217   ALBUMIN 3.9 04/07/2014 1217   AST 15 04/07/2014 1217   ALT 14 04/07/2014 1217   ALKPHOS 66 04/07/2014 1217   BILITOT 0.4 04/07/2014 1217   GFRNONAA 63* 05/30/2014 1211   GFRNONAA 62 04/07/2014 1217   GFRAA 73* 05/30/2014 1211   GFRAA 72 04/07/2014 1217   Lab Results  Component Value Date   CHOL 151 10/11/2013   HDL 48 10/11/2013   LDLCALC 71 10/11/2013   TRIG 162* 10/11/2013    Accessory Clinical Findings  Echocardiogram - 10/11/2013  Left ventricle: The cavity size was normal. Wall thickness was increased in a pattern of mild LVH. Systolic function was normal. The estimated ejection fraction was  in the range of 50% to 55%. Wall motion was normal; there were no regional wall motion abnormalities. The transmitral flow pattern was normal. The deceleration time of the early transmitral flow velocity was normal. The pulmonary vein flow pattern was normal. The tissue Doppler parameters were normal. Left ventricular diastolic function parameters were normal. ------------------------------------------------------------ Aortic valve: Structurally normal valve. Cusp separation was normal. Doppler: Transvalvular velocity was within the normal range. There was no stenosis. No regurgitation. ------------------------------------------------------------ Aorta: The aorta was normal, not dilated, and non-diseased. ------------------------------------------------------------ Mitral valve: Structurally normal valve. Leaflet separation was normal. Doppler: Transvalvular velocity was within the normal range. There was no evidence for stenosis. No regurgitation. Peak gradient: 3mm Hg (D). ------------------------------------------------------------ Left atrium: The atrium was normal in size. ------------------------------------------------------------ Right  ventricle: The cavity size was normal. Wall thickness was normal. Systolic function was normal. ------------------------------------------------------------ Pulmonic valve: Structurally normal valve. Cusp separation was normal. Doppler: Transvalvular velocity was within the normal range. No regurgitation. ------------------------------------------------------------ Tricuspid valve: Structurally normal valve. Leaflet separation was normal. Doppler: Transvalvular velocity was within the normal range. No regurgitation.  ECG - SR, normal ECG  Coronary CTA: 07/17/2014  Coronary Arteries: Right dominance. 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  Left main is a large caliber vessel that originates in the left sinus of Valsalva. There is no plaque. LM gives rise to LAD and LCX artery. LAD is a large caliber vessel that wraps around the apex and give rise to one moderate caliber diagonal branch. There is no plaque in LAD/D1 territory.  LCX is a moderate caliber non-dominant vessel that gives rise to a large tortuous obtuse marginal branch. No plaque was seen in LCX/OM1.  RCA is a large caliber dominant vessel that originates high above the left sinus of Valsalva (24 mm above the aortic annulus). RCA runs to the right in between aorta and main pulmonary artery just slightly above the pulmonary valve. There is no stenosis seen in the ostial portion in diastole when the images were acquired. However, this was a prospective study and images in systole are not available.  The remaining portion of the RCA has normal course and no plaque. PDA and PLVB have no plaque.  IMPRESSION: 1. Coronary calcium score of 0. This was 0 percentile for age and sex matched control.  2. 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 Systolic inter-arterial compression can't be excluded on  this study.  3. Right dominance. No evidence of CAD.  An exercise treadmill stress test will be scheduled to evaluate for exertional ischemia - ECG changes and symptoms suggestive of systolic inter-arterial compression.  Tobias Alexander   ETT: 11/28/2014 Poor exercise capacity. No chest pain.  Patient did complain of dyspnea. Normal BP response to exercise.  BP cuff error during exercise - BP taken immediately post exercise. No ST changes to suggest ischemia.  O2 sat 99% at peak exercise.    Assessment & Plan  A very pleasant 49 year old female  1. Chest pain - coronary CT showed anomalous RCA originating high above the left sinus of Valsalva and running between aorta and main pulmonary artery. An exercise treadmill stress test was negative for ischemia, she was originally advised to monitor symptoms and let us know when they worsen. She is now experiencing worsening exercise tolerance and left hand numbness with exertion. We will refer to CT surgery for consideration of surgery, the concern is inter-arterial compression  of RCA.   2. Acute diastolic CHF - repeat echocardiogram, a year ago normal LVEF and diastolic function. D/C HCTZ, start lasix 40 mg po daily. Check BNP, CMP, troponin today.   3. Hypertension - with evidence of mild LVH on her echocardiogram, controlled after adding HCTZ and atenolol.  4. Lipids - mildly elevated triglycerides 162, she has plenty of room for improvement in her diet. She was counseled on proper diet for now no medication. No statin as there is no plaque on coronary CT.  Follow up after 6 weeks, referral to CT surgery.    Lars Masson, MD, [facility] 04/16/2015, 3:52 PM

## 2015-04-16 NOTE — Telephone Encounter (Signed)
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 Pt with edema to BLE, face States has had 15 # weight gain over last 3 weeks and again states no UOP x 3 DAYS.  At this point I advised that patient needs to be seen emergently at the ED and repeated this. Patient is at work and refuses to go to ED and wants to see Dr. Delton See if possible today.  Appointment provided today with Dr. Delton See.

## 2015-04-16 NOTE — Telephone Encounter (Signed)
Pt c/o swelling: STAT is pt has developed SOB within 24 hours  1. How long have you been experiencing swelling? For about 3 days  2. Where is the swelling located? Face lower extremities, Earlobes and Eyelids   3.  Are you currently taking a "fluid pill"? No she is not on a diruretic. Taking BP meds. Pressure is not taken.   4.  Are you currently SOB? Yes it has gotten worse over the weekend. A little SOB now.   5.  Have you traveled recently? No. 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 Pt is exteremly edemous face and eyelids are swollen. The Rep Lurena Joiner states that per the pt she hasnt voided in three days.

## 2015-04-16 NOTE — Addendum Note (Signed)
Addended by: Tonita Phoenix on: 04/16/2015 04:39 PM   Modules accepted: Orders

## 2015-04-16 NOTE — Telephone Encounter (Signed)
Attempted to inform patient regarding negative stat trop, patient did not pick up the phone.  SignRamond DialPA Pager: [phone]

## 2015-04-16 NOTE — Addendum Note (Signed)
Addended by: Tonita Phoenix on: 04/16/2015 04:38 PM   Modules accepted: Orders

## 2015-04-16 NOTE — Patient Instructions (Addendum)
Medication Instruction  START TAKING LASIX 40 MG ONCE A DAY   STOP HCTZ 25 MG ONCE  DAY    Labwork:  BMET CBC TSH LFT BNP TROPONIN I   Testing/Procedures:  Your physician has requested that you have an echocardiogram. Echocardiography is a painless test that uses sound waves to create images of your heart. It provides your doctor with information about the size and shape of your heart and how well your heart's chambers and valves are working. This procedure takes approximately one hour. There are no restrictions for this procedure.    Follow-Up:  IN 6 WEEKS WITH DR Delton See   You have been referred to the cardiacthoraic surgery center   Any Other Special Instructions Will Be Listed Below (If Applicable).

## 2015-04-16 NOTE — Addendum Note (Signed)
Addended by: Neosha Switalski K on: 04/16/2015 04:39 PM   Modules accepted: Orders  

## 2015-04-17 ENCOUNTER — Telehealth: Payer: Self-pay | Admitting: Cardiology

## 2015-04-17 LAB — HEPATIC FUNCTION PANEL
ALT: 15 U/L (ref 0–35)
AST: 16 U/L (ref 0–37)
Albumin: 4.1 g/dL (ref 3.5–5.2)
Alkaline Phosphatase: 65 U/L (ref 39–117)
Bilirubin, Direct: 0.1 mg/dL (ref 0.0–0.3)
Total Bilirubin: 0.4 mg/dL (ref 0.2–1.2)
Total Protein: 7.4 g/dL (ref 6.0–8.3)

## 2015-04-17 LAB — CBC
HCT: 33.6 % — ABNORMAL LOW (ref 36.0–46.0)
Hemoglobin: 11 g/dL — ABNORMAL LOW (ref 12.0–15.0)
MCHC: 32.7 g/dL (ref 30.0–36.0)
MCV: 80.6 fl (ref 78.0–100.0)
Platelets: 284 10*3/uL (ref 150.0–400.0)
RBC: 4.17 Mil/uL (ref 3.87–5.11)
RDW: 15.1 % (ref 11.5–15.5)
WBC: 7.1 10*3/uL (ref 4.0–10.5)

## 2015-04-17 LAB — BRAIN NATRIURETIC PEPTIDE: Pro B Natriuretic peptide (BNP): 78 pg/mL (ref 0.0–100.0)

## 2015-04-17 LAB — BASIC METABOLIC PANEL
BUN: 15 mg/dL (ref 6–23)
CO2: 28 mEq/L (ref 19–32)
Calcium: 9.3 mg/dL (ref 8.4–10.5)
Chloride: 101 mEq/L (ref 96–112)
Creatinine, Ser: 1.15 mg/dL (ref 0.40–1.20)
GFR: 64.3 mL/min (ref >60.00)
Glucose, Bld: 75 mg/dL (ref 70–99)
Potassium: 3.6 mEq/L (ref 3.5–5.1)
Sodium: 136 mEq/L (ref 135–145)

## 2015-04-17 LAB — TSH: TSH: 1.38 u[IU]/mL (ref 0.35–4.50)

## 2015-04-17 NOTE — Telephone Encounter (Signed)
Spoke with the pt and informed her that per Azalee Course PA, the pts ISTAT troponin was negative.  Also informed the pt that I will follow-up with her when the rest of her results that Dr Delton See had drawn on the pt yesterday are resulted, for they are still pending.  Pt verbalized understanding and agrees with this plan.

## 2015-04-17 NOTE — Telephone Encounter (Signed)
New problem   Pt calling about her lab results from yesterday.

## 2015-04-17 NOTE — Telephone Encounter (Signed)
Attempted to call the pt back in regards to her lab result ordered yesterday.  Pt has no VM set up. Appears that Azalee Course, PA tried contacting the pt yesterday in regards to her negative stat troponin and was unable to reach the pt.  Below mentioned is Azalee Course, PA's note   Azalee Course, [place] at 04/16/2015 8:44 PM     Status: Signed       Expand All Collapse All   Attempted to inform patient regarding negative stat trop, patient did not pick up the phone.

## 2015-04-19 ENCOUNTER — Other Ambulatory Visit: Payer: Self-pay

## 2015-04-19 ENCOUNTER — Ambulatory Visit (HOSPITAL_COMMUNITY): Payer: Managed Care, Other (non HMO) | Attending: Cardiology

## 2015-04-19 DIAGNOSIS — I359 Nonrheumatic aortic valve disorder, unspecified: Secondary | ICD-10-CM | POA: Insufficient documentation

## 2015-04-19 DIAGNOSIS — I509 Heart failure, unspecified: Secondary | ICD-10-CM

## 2015-04-19 DIAGNOSIS — Q245 Malformation of coronary vessels: Secondary | ICD-10-CM

## 2015-04-19 DIAGNOSIS — E785 Hyperlipidemia, unspecified: Secondary | ICD-10-CM

## 2015-04-19 DIAGNOSIS — I10 Essential (primary) hypertension: Secondary | ICD-10-CM | POA: Diagnosis not present

## 2015-04-19 DIAGNOSIS — I209 Angina pectoris, unspecified: Secondary | ICD-10-CM

## 2015-04-23 ENCOUNTER — Encounter: Payer: Self-pay | Admitting: Thoracic Surgery (Cardiothoracic Vascular Surgery)

## 2015-04-23 ENCOUNTER — Institutional Professional Consult (permissible substitution) (INDEPENDENT_AMBULATORY_CARE_PROVIDER_SITE_OTHER): Payer: Managed Care, Other (non HMO) | Admitting: Thoracic Surgery (Cardiothoracic Vascular Surgery)

## 2015-04-23 DIAGNOSIS — Q245 Malformation of coronary vessels: Secondary | ICD-10-CM | POA: Diagnosis not present

## 2015-04-23 DIAGNOSIS — I5032 Chronic diastolic (congestive) heart failure: Secondary | ICD-10-CM | POA: Insufficient documentation

## 2015-04-23 NOTE — Progress Notes (Signed)
[address]       [place] [address]             [phone]     CARDIOTHORACIC SURGERY CONSULTATION REPORT  Referring Provider is Lars Masson, MD PCP is Ambrose Finland, NP  Chief Complaint  Patient presents with  . 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  The patient has been followed for more than one year by Dr. Delton See after she initially was evaluated for atypical symptoms of chest discomfort. Initially symptoms were suspected to be of GI etiology and nuclear stress test performed March 2015 was felt to be low risk for ischemia.  She developed progressive symptoms of exertional shortness of breath, orthopnea, and bilateral lower extremity edema. Cardiac gated CT angiogram of the heart performed in December of 2015 demonstrated a nominal origin of the right coronary artery off of the left sinus of Valsalva.  Exercise treadmill test was performed April 2016. The patient exercised to a maximum heart rate of 171 (85% MPHR) and stopped the test due to fatigue and shortness of breath without chest pain or ST segment depression on EKG.  Medical therapy was recommended.  The patient recently was seen in follow-up by Dr. Delton See at which time she complained of continued progression of symptoms of exertional shortness of breath, weight gain, orthopnea, and lower extremity edema. Follow-up echocardiogram was performed demonstrating normal left ventricular systolic function with ejection fraction estimated at 55-60%. Cardiothoracic surgical consultation was requested.  The patient is  single and lives locally in [place] with her youngest daughter. She has 3 adult children. The patient works full-time as a Education administrator at [employer]. Patient has been moderately obese most of her adult life but reports that she has gained 15 pounds over the last month. She has decreased energy. She denies fevers or chills. She reports symptoms of exertional shortness of breath and fatigue. She is exhausted by the end of the day every day at work. She denies history of resting shortness of breath. She sleeps on 3 pillows at night and is not comfortable breathing when she tries to sleep on one pillow. She has had lower extremity edema. She describes occasional twinges of pain across her chest that may or may not be related to activity.  Past Medical History  Diagnosis Date  . Microcytic anemia     a. No prior workup.  . Migraines   . Low back pain   . Atypical chest pain     a. 10/2013: suspected GI etiology. Nuc - nonischemic, EF 39% but visually higher - f/u echo to clarify showed EF 50-55% with only mild LVH, no significant valvuar disease.  [name] HTN (hypertension)   . Abnormal CT of the chest     a. 10/2013: 4mm RML pulm nodule, axillary lymphadenopathy. Instructed to f/u PCP.  [name] Anomalous right coronary artery     Past Surgical History  Procedure Laterality Date  . Tonsillectomy  1992?  [name] Cesarean section  1988  . Tubal ligation  1995    Family History  Problem Relation Age  of Onset  . Heart disease Father     Dx early 50's (? heart valve)  . Congestive Heart Failure Mother   . CAD Neg Hx   . Heart attack Father 58    Social History   Social History  . Marital Status: Single    Spouse Name: N/A  . Number of Children: N/A  . Years of Education: N/A   Occupational History  . Not on file.   Social History Main Topics  . Smoking status: Never Smoker   . Smokeless tobacco: Never Used  . Alcohol Use: No  . Drug Use: No  . Sexual Activity: Not  Currently    Birth Control/ Protection: Surgical   Other Topics Concern  . Not on file   Social History Narrative    Current Outpatient Prescriptions  Medication Sig Dispense Refill  . aspirin 81 MG chewable tablet Chew by mouth daily.    [name] aspirin-acetaminophen-caffeine (EXCEDRIN MIGRAINE) 250-250-65 MG per tablet Take 2 tablets by mouth every 6 (six) hours as needed for migraine.     [name] atenolol (TENORMIN) 25 MG tablet Take 1 tablet (25 mg total) by mouth daily. 90 tablet 3  . furosemide (LASIX) 40 MG tablet Take 1 tablet (40 mg total) by mouth daily. 30 tablet 6  . ipratropium (ATROVENT) 0.06 % nasal spray Place 2 sprays into both nostrils 4 (four) times daily. 15 mL 1  . irbesartan (AVAPRO) 150 MG tablet Take 1 tablet (150 mg total) by mouth daily. 30 tablet 2  . LORazepam (ATIVAN) 0.5 MG tablet Take 0.5 mg by mouth as needed.     . sertraline (ZOLOFT) 25 MG tablet Take 1 tablet (25 mg total) by mouth daily. (Patient not taking: Reported on 04/23/2015) 30 tablet 3   No current facility-administered medications for this visit.    No Known Allergies    Review of Systems:   General:  normal appetite, decreased energy, + weight gain, no weight loss, no fever  Cardiac:  + chest pain with exertion, + chest pain at rest, +SOB with exertion, no resting SOB, no PND, + orthopnea, no palpitations, no arrhythmia, no atrial fibrillation, + LE edema, - dizzy spells, no syncope  Respiratory:  + shortness of breath, no home oxygen, no productive cough, no dry cough, no bronchitis, no wheezing, no hemoptysis, no asthma, + pain with inspiration or cough, no sleep apnea, no CPAP at night  GI:   no difficulty swallowing, + reflux, no frequent heartburn, no hiatal hernia, no abdominal pain, no constipation, no diarrhea, no hematochezia, no hematemesis, no melena  GU:   no dysuria,  no frequency, no urinary tract infection, no hematuria, no kidney stones, no kidney disease  Vascular:  no pain  suggestive of claudication, no pain in feet, + leg cramps, no varicose veins, no DVT, nono non-healing foot ulcer  Neuro:   no stroke, no TIA's, no seizures, + headaches, no temporary blindness one eye,  no slurred speech, no peripheral neuropathy, no chronic pain, no instability of gait, no memory/cognitive dysfunction  Musculoskeletal: + arthritis in both knees, + joint swelling, no myalgias, no difficulty walking, normal mobility   Skin:   no rash, no itching, no skin infections, no pressure sores or ulcerations  Psych:   + anxiety, no depression, no nervousness, no unusual recent stress  Eyes:   no blurry vision, no floaters, no recent vision changes, + wears glasses or contacts  ENT:   no hearing loss, no loose or  painful teeth, no dentures  Hematologic:  no easy bruising, no abnormal bleeding, no clotting disorder, no frequent epistaxis  Endocrine:  no diabetes, does not check CBG's at home     Physical Exam:   BP 132/82 mmHg  Pulse 70  Resp 20  Ht 5\' 6"  (1.676 m)  Wt 224 lb (101.606 kg)  BMI 36.17 kg/m2  SpO2 99%  General:  Obese but o/w  well-appearing  HEENT:  Unremarkable   Neck:   no JVD, no bruits, no adenopathy   Chest:   clear to auscultation, symmetrical breath sounds, no wheezes, no rhonchi   CV:   RRR, no  murmur   Abdomen:  soft, non-tender, no masses   Extremities:  warm, well-perfused, pulses palpable, + bilateral LE edema  Rectal/GU  Deferred  Neuro:   Grossly non-focal and symmetrical throughout  Skin:   Clean and dry, no rashes, no breakdown   Diagnostic Tests:  Cardiac/Coronary CT  TECHNIQUE: The patient was scanned on a Philips 256 scanner.  FINDINGS: A 120 kV prospective scan was triggered in the descending thoracic aorta at 111 HU's. Axial non-contrast 3 mm slices were carried out through the heart. The data set was analyzed on a dedicated work station and scored using the Agatson method. Gantry rotation speed was 270 msecs and collimation was  .9 mm. 5 mg of iv Metoprolol and 0.4 mg of sl NTG was given. The 3D data set was reconstructed in 5% intervals of the 67-82 % of the R-R cycle. Diastolic phases were analyzed on a dedicated work station using MPR, MIP and VRT modes. The patient received 80 cc of contrast.  Aorta: Normal caliber. No dissection. No calcifications.  Aortic Valve: Trileaflet. No calcifications or thickening.  Coronary Arteries: Right dominance. 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  Left main is a large caliber vessel that originates in the left sinus of Valsalva. There is no plaque. LM gives rise to LAD and LCX artery. LAD is a large caliber vessel that wraps around the apex and give rise to one moderate caliber diagonal branch. There is no plaque in LAD/D1 territory.  LCX is a moderate caliber non-dominant vessel that gives rise to a large tortuous obtuse marginal branch. No plaque was seen in LCX/OM1.  RCA is a large caliber dominant vessel that originates high above the left sinus of Valsalva (24 mm above the aortic annulus). RCA runs to the right in between aorta and main pulmonary artery just slightly above the pulmonary valve. There is no stenosis seen in the ostial portion in diastole when the images were acquired. However, this was a prospective study and images in systole are not available.  The remaining portion of the RCA has normal course and no plaque. PDA and PLVB have no plaque.  IMPRESSION: 1. Coronary calcium score of 0. This was 0 percentile for age and sex matched control.  2. 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 Systolic inter-arterial compression can't be excluded on this study.  3. Right dominance. No evidence of CAD.  An exercise treadmill stress test will be scheduled to evaluate for exertional ischemia - ECG changes and symptoms  suggestive of systolic inter-arterial compression.  Tobias Alexander   Electronically Signed  By: Tobias Alexander  On: 07/17/2014 18:31    Transthoracic Echocardiography  Patient:  Anisten, Tomassi MR #:    [id number] Study Date: 04/19/2015  Gender:   F Age:    27 Height:   167.6 cm Weight:   103 kg BSA:    2.23 m^2 Pt. Status: Room:  SONOGRAPHER Clearence Ped, RCS ATTENDING  Tobias Alexander, M.D. ORDERING   Tobias Alexander, M.D. REFERRING  Tobias Alexander, M.D. PERFORMING  [facility], Outpatient  cc:  ------------------------------------------------------------------- LV EF: 55% -  60%  ------------------------------------------------------------------- Indications:   424.1 Aortic valve disorders (I35.0).  ------------------------------------------------------------------- Study Conclusions  - Left ventricle: The cavity size was normal. Wall thickness was normal. Systolic function was normal. The estimated ejection fraction was in the range of 55% to 60%. Wall motion was normal; there were no regional wall motion abnormalities. Left ventricular diastolic function parameters were normal.  Transthoracic echocardiography. M-mode, complete 2D, spectral Doppler, and color Doppler. Birthdate: Patient birthdate: 10/04/1965. Age: Patient is 49 yr old. Sex: Gender: female. BMI: 36.6 kg/m^2. Blood pressure:   115/70 Patient status: Outpatient. Study date: Study date: 04/19/2015. Study time: 02:23 PM. Location: Echo laboratory.  -------------------------------------------------------------------  ------------------------------------------------------------------- Left ventricle: The cavity size was normal. Wall thickness was normal. Systolic function was normal. The estimated ejection fraction was in the range of 55% to 60%. Wall motion was normal; there were no regional wall motion abnormalities. Left  ventricular diastolic function parameters were normal.  ------------------------------------------------------------------- Aortic valve: Poorly visualized. Structurally normal valve. Cusp separation was normal. Doppler: Transvalvular velocity was within the normal range. There was no stenosis. There was no regurgitation.  ------------------------------------------------------------------- Aorta: The aorta was normal, not dilated, and non-diseased.  ------------------------------------------------------------------- Mitral valve:  Structurally normal valve.  Leaflet separation was normal. Doppler: Transvalvular velocity was within the normal range. There was no evidence for stenosis. There was trivial regurgitation.  Peak gradient (D): 4 mm Hg.  ------------------------------------------------------------------- Left atrium: The atrium was normal in size.  ------------------------------------------------------------------- Right ventricle: The cavity size was normal. Wall thickness was normal. Systolic function was normal.  ------------------------------------------------------------------- Pulmonic valve:  Poorly visualized. Structurally normal valve. Cusp separation was normal. Doppler: Transvalvular velocity was within the normal range. There was trivial regurgitation.  ------------------------------------------------------------------- Tricuspid valve:  Structurally normal valve.  Leaflet separation was normal. Doppler: Transvalvular velocity was within the normal range. There was trivial regurgitation.  ------------------------------------------------------------------- Pulmonary artery:  The main pulmonary artery was normal-sized.  ------------------------------------------------------------------- Right atrium: The atrium was normal in size.  ------------------------------------------------------------------- Pericardium: The pericardium was  normal in appearance. There was no pericardial effusion.  ------------------------------------------------------------------- Systemic veins: Inferior vena cava: The vessel was normal in size. The respirophasic diameter changes were in the normal range (= 50%), consistent with normal central venous pressure. Diameter: 17 mm.  ------------------------------------------------------------------- Post procedure conclusions Ascending Aorta:  - The aorta was normal, not dilated, and non-diseased.  ------------------------------------------------------------------- Measurements  IVC                     Value    Reference ID                     17  mm   ---------  Left ventricle               Value    Reference LV ID, ED, PLAX chordal       (L)   38.3 mm   43 - 52 LV ID, ES, PLAX chordal           27.7 mm   23 - 38 LV fx shortening, PLAX chordal   (L)   28  %   >=  29 LV PW thickness, ED             10.9 mm   --------- IVS/LV PW ratio, ED             0.78     <=1.3 Stroke volume, 2D              62  ml   --------- Stroke volume/bsa, 2D            28  ml/m^2 --------- LV e&', lateral               11.8 cm/s  --------- LV E/e&', lateral              8.08     --------- LV e&', medial                11.6 cm/s  --------- LV E/e&', medial               8.22     --------- LV e&', average               11.7 cm/s  --------- LV E/e&', average              8.15     ---------  Ventricular septum             Value    Reference IVS thickness, ED              8.52 mm   ---------  LVOT                    Value    Reference LVOT ID, S                  19  mm   --------- LVOT area                  2.84 cm^2  --------- LVOT peak velocity, S            94  cm/s  --------- LVOT mean velocity, S            66.6 cm/s  --------- LVOT VTI, S                 21.7 cm   ---------  Aorta                    Value    Reference Aortic root ID, ED             27  mm   ---------  Left atrium                 Value    Reference LA ID, A-P, ES               32  mm   --------- LA ID/bsa, A-P               1.43 cm/m^2 <=2.2 LA volume, S                41  ml   --------- LA volume/bsa, S              18.3 ml/m^2 --------- LA volume, ES, 1-p A4C           37  ml   --------- LA volume/bsa, ES, 1-p A4C         16.6 ml/m^2 --------- LA volume, ES, 1-p A2C  45  ml   --------- LA volume/bsa, ES, 1-p A2C         20.1 ml/m^2 ---------  Mitral valve                Value    Reference Mitral E-wave peak velocity         95.3 cm/s  --------- Mitral A-wave peak velocity         77.5 cm/s  --------- Mitral deceleration time          158  ms   150 - 230 Mitral peak gradient, D           4   mm Hg --------- Mitral E/A ratio, peak           1.2     ---------  Pulmonary arteries             Value    Reference PA pressure, S, DP             23  mm Hg <=30  Tricuspid valve               Value    Reference Tricuspid regurg peak velocity       221  cm/s  --------- Tricuspid peak RV-RA gradient        20  mm Hg --------- Tricuspid maximal regurg velocity,     221  cm/s  --------- PISA  Systemic veins                Value    Reference Estimated CVP                3   mm Hg ---------  Right ventricle               Value    Reference RV pressure, S, DP             23  mm Hg <=30 RV s&', lateral, S              13.7 cm/s  ---------  Legend: (L) and (H) mark values outside specified reference range.  ------------------------------------------------------------------- Prepared and Electronically Authenticated by  Donato Schultz, M.D. 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 She presents with progressive symptoms of exertional shortness of breath, orthopnea, and lower extremity edema consistent with chronic diastolic congestive heart failure. She has had some atypical chest discomfort off and on, but no clear symptoms of exertional chest pain suggestive of angina pectoris. Previous nuclear stress test was felt to be low risk for ischemia, and exercise treadmill test performed last April was notable for the absence of development of exertional chest pain or EKG changes at peak exercise.  I have personally reviewed the patient's cardiac gated CT angiogram of the heart performed December 2015. The right coronary artery courses between the aorta and the pulmonary artery. I have also reviewed the patient's recent transthoracic echocardiogram. This confirms the presence of normal left ventricular systolic function and no other significant abnormalities.    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  Surgical options include "unroofing" of the proximal segment of the right coronary artery and coronary artery bypass  grafting, but efficacy  of these procedures has never been clearly demonstrated. I would be very cautious to suggest that surgical intervention might have a high likelihood to improve the patient's symptoms of congestive heart failure.   Plan:  CPX testing could be considered to further characterize the etiology of the patient's poor exercise tolerance and symptoms. Left and right heart catheterization might be reasonable and probably should include a formal oxygen saturation run to definitively rule out the presence of possible shunting. We will discuss the possibility of arranging such tests with Dr. Delton See and the patient will return for follow-up in 4 weeks.   I spent in excess of 90 minutes during the conduct of this office consultation and >50% of this time involved direct face-to-face encounter with the patient for counseling and/or coordination of their care.    Salvatore Decent. Cornelius Moras, MD 04/23/2015 7:06 PM

## 2015-04-24 ENCOUNTER — Telehealth: Payer: Self-pay | Admitting: *Deleted

## 2015-04-24 DIAGNOSIS — I5032 Chronic diastolic (congestive) heart failure: Secondary | ICD-10-CM

## 2015-04-24 DIAGNOSIS — Q245 Malformation of coronary vessels: Secondary | ICD-10-CM

## 2015-04-24 DIAGNOSIS — R0602 Shortness of breath: Secondary | ICD-10-CM | POA: Insufficient documentation

## 2015-04-24 DIAGNOSIS — I10 Essential (primary) hypertension: Secondary | ICD-10-CM

## 2015-04-24 NOTE — Telephone Encounter (Signed)
Notified the pt that per Dr Delton See and per Dr Cornelius Moras at [facility],  the pt needs to be set up for a CPX (cardiopulmary test). Pt states that Dr Cornelius Moras informed her that our office would be calling her to have this test scheduled.  Pt saw Dr Cornelius Moras at [facility] yesterday 9/19 and this was discussed thoroughly with the pt.  Per Dr Cornelius Moras the pt needs to have this set up to characterize the etiology of the patient's poor exercise tolerance and symptoms. Informed the pt that someone from our [facility] scheduling will be calling her to have this test scheduled.  Briefly went over CPX instruction sheet with the pt over the phone and informed her that she would receive a copy of these instructions to either pick-up or to be mailed to her by one of our schedulers.  Pt states she prefers picking this sheet up, for she works very close to our office. Pt verbalized understanding and agrees with this plan.

## 2015-04-24 NOTE — Telephone Encounter (Signed)
Scheduling notified the pt of CPX to be done at [facility] on 05/02/15 at 0830.  Pt will pick up instruction sheet for this test, here at our office.

## 2015-04-30 ENCOUNTER — Ambulatory Visit: Payer: Self-pay

## 2015-04-30 ENCOUNTER — Other Ambulatory Visit: Payer: Self-pay | Admitting: Occupational Medicine

## 2015-04-30 DIAGNOSIS — M25531 Pain in right wrist: Secondary | ICD-10-CM

## 2015-04-30 DIAGNOSIS — M79601 Pain in right arm: Secondary | ICD-10-CM

## 2015-05-01 ENCOUNTER — Telehealth: Payer: Self-pay | Admitting: Cardiology

## 2015-05-01 NOTE — Telephone Encounter (Signed)
Informed the pt that it would be safe for her to take her naproxen sodium 220 mg for her arm pain, for she states she pulled a muscle at work and was prescribed this anti-inflammatory for pain.  Informed the pt that there is no contraindication with taking naproxen with having a CPX done. Pt verbalized understanding and agrees with this plan.

## 2015-05-01 NOTE — Telephone Encounter (Signed)
New message    Patient calling having a procedure done on tomorrow.   Patient was hurt a work on yesterday - asking can we fax her instruction to her job.    Work fax # [phone]

## 2015-05-01 NOTE — Telephone Encounter (Signed)
Pt is scheduled to have a CPX done tomorrow at [facility] and she was to come into the office to pick the instruction sheet up for this test, and is unable to do so today, due to being at work. Pt states she was slightly hurt at work, and after she gets off, she just wants to go home.  Pt states she would like for her instruction sheet to be faxed to her at her job, at her own personal fax at [phone] attention [place].  Informed the pt that I would need to contact our [facility] scheduler, who scheduled this test and has her instruction sheet.  Informed the pt that once our scheduler is contacted when she comes into work, I will have her fax this to the number requested by the pt.  Pt verbalized understanding and agrees with this plan.  Pt gracious for all the assistance provided.   Spoke to Gavin Pound in [facility] and she will try and locate this instruction sheet and fax to the pts work fax number as requested.

## 2015-05-01 NOTE — Telephone Encounter (Signed)
Spoke with the pt and she said she received her CPX instructions through fax today from our office. Pt gracious for all the assistance provided.

## 2015-05-01 NOTE — Telephone Encounter (Signed)
New message      Pt is having a test tomorrow.  She forgot to ask if she can take naproxin  for arm pain tonight and tomorrow.  She hurt her arm and her doctor gave it to her.  Please call

## 2015-05-02 ENCOUNTER — Ambulatory Visit (HOSPITAL_COMMUNITY): Payer: Managed Care, Other (non HMO) | Attending: Internal Medicine

## 2015-05-02 ENCOUNTER — Other Ambulatory Visit (HOSPITAL_COMMUNITY): Payer: Self-pay | Admitting: *Deleted

## 2015-05-02 DIAGNOSIS — I5032 Chronic diastolic (congestive) heart failure: Secondary | ICD-10-CM | POA: Insufficient documentation

## 2015-05-02 DIAGNOSIS — R0602 Shortness of breath: Secondary | ICD-10-CM | POA: Diagnosis not present

## 2015-05-02 DIAGNOSIS — I5031 Acute diastolic (congestive) heart failure: Secondary | ICD-10-CM | POA: Diagnosis not present

## 2015-05-02 DIAGNOSIS — I10 Essential (primary) hypertension: Secondary | ICD-10-CM

## 2015-05-02 DIAGNOSIS — Q245 Malformation of coronary vessels: Secondary | ICD-10-CM | POA: Insufficient documentation

## 2015-05-10 ENCOUNTER — Telehealth: Payer: Self-pay | Admitting: Cardiology

## 2015-05-10 ENCOUNTER — Telehealth: Payer: Self-pay | Admitting: Internal Medicine

## 2015-05-10 DIAGNOSIS — I509 Heart failure, unspecified: Secondary | ICD-10-CM

## 2015-05-10 DIAGNOSIS — Q245 Malformation of coronary vessels: Secondary | ICD-10-CM

## 2015-05-10 DIAGNOSIS — E785 Hyperlipidemia, unspecified: Secondary | ICD-10-CM

## 2015-05-10 DIAGNOSIS — I10 Essential (primary) hypertension: Secondary | ICD-10-CM

## 2015-05-10 DIAGNOSIS — I209 Angina pectoris, unspecified: Secondary | ICD-10-CM

## 2015-05-10 MED ORDER — POTASSIUM CHLORIDE CRYS ER 10 MEQ PO TBCR: 10.0000 meq | EXTENDED_RELEASE_TABLET | Freq: Every day | ORAL | Status: DC

## 2015-05-10 MED ORDER — FUROSEMIDE 40 MG PO TABS: 40.0000 mg | ORAL_TABLET | Freq: Every day | ORAL | Status: DC

## 2015-05-10 NOTE — Telephone Encounter (Signed)
Follow Up ° °Pt returned call//  °

## 2015-05-10 NOTE — Telephone Encounter (Signed)
Notified the pt back and informed her that per Dr Delton See she wants the pt to continue taking Lasix 40 mg po daily, but may take an additional 40 mg tablet of lasix to her daily regimen, only as needed for LEE, and generalized edema.  Also informed the pt that per Dr Delton See she would like to add K-DUR 10 mEq po daily to her regimen, to maintain her K level.  Confirmed the pharmacy of choice with the pt.  Advised the pt that she must maintain a very low sodium diet, avoid greasy and fatty foods, drink water, wear her support stockings while working, and elevate her legs in the evening when resting, to aid in decreasing her swelling.  Pt verbalized understanding, agrees with this plan, and gracious for all the assistance provided.

## 2015-05-10 NOTE — Telephone Encounter (Signed)
Patient called requesting to speak to PCP or the Nurse regarding a prescription that was prescribed to her by Dr. Delton See.

## 2015-05-10 NOTE — Telephone Encounter (Signed)
New message      Pt c/o swelling: STAT is pt has developed SOB within 24 hours  1. How long have you been experiencing swelling? At least a week 2. Where is the swelling located? Arms,legs,face 3.  Are you currently taking a "fluid pill"? lasix  daily  4.  Are you currently SOB?  no 5.  Have you traveled recently?no

## 2015-05-10 NOTE — Telephone Encounter (Signed)
Pt calling with complaints of lower extremity edema, swollen arms, and her face is even swollen.  Pt states she is taking her lasix 40 mg po daily, and has drastically decreased the sodium in her diet.  Pt states that she has DOE.  Pt was at work when I called her. Pt was speaking in complete full sentences with no difficulty in breathing while speaking with me.  Pt would like for Dr Delton See to advise on a different regimen to decrease the swelling.  Pt even suggests that Dr Delton See should increase her lasix.  Pt has no cp or any other cardiac complaints at this time.  Informed the pt that I will route this message to Dr Delton See for further review and recommendation and follow-up with her thereafter.  Pt verbalized understanding and agrees with this plan.

## 2015-05-21 ENCOUNTER — Encounter: Payer: Self-pay | Admitting: Thoracic Surgery (Cardiothoracic Vascular Surgery)

## 2015-05-21 ENCOUNTER — Ambulatory Visit (INDEPENDENT_AMBULATORY_CARE_PROVIDER_SITE_OTHER): Payer: Managed Care, Other (non HMO) | Admitting: Thoracic Surgery (Cardiothoracic Vascular Surgery)

## 2015-05-21 VITALS — BP 122/86 | HR 82 | Resp 16 | Ht 66.0 in | Wt 224.0 lb

## 2015-05-21 DIAGNOSIS — Q245 Malformation of coronary vessels: Secondary | ICD-10-CM

## 2015-05-21 NOTE — Patient Instructions (Signed)
Resume normal activity without any limitations  Continue all previous medications without any changes at this time  Make every effort to stay physically active, get some type of exercise on a regular basis, and stick to a "heart healthy diet".  The long term benefits for regular exercise and a healthy diet are critically important to your overall health and wellbeing.

## 2015-05-21 NOTE — Progress Notes (Signed)
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 She was originally seen in consultation on 04/23/2015. The patient's primary presenting symptoms were that of exertional shortness of breath, orthopnea and lower extremity edema.  She has never had symptoms or signs of angina pectoris. She recently underwent formal cardiopulmonary exercise testing and she returns to our office today to review the results of these tests. She reports no new problems or complaints over the last few weeks.   Current Outpatient Prescriptions  Medication Sig Dispense Refill  . aspirin 81 MG chewable tablet Chew by mouth daily.    [name]. aspirin-acetaminophen-caffeine (EXCEDRIN MIGRAINE) 250-250-65 MG per tablet Take 2 tablets by mouth every 6 (six) hours as needed for migraine.     [name]. atenolol (TENORMIN) 25 MG tablet Take 1 tablet (25 mg total) by mouth daily. 90 tablet 3  . furosemide (LASIX) 40 MG tablet Take 1 tablet (40 mg total) by mouth daily. You may take 1 additional 40 mg tablet of lasix as needed for lower extremity edema 90 tablet 3  . ipratropium (ATROVENT) 0.06 % nasal spray Place 2 sprays into both nostrils 4 (four) times daily. 15 mL 1  . irbesartan (AVAPRO) 150 MG tablet Take 1 tablet (150 mg total) by mouth daily. 30 tablet 2  . LORazepam (ATIVAN) 0.5 MG tablet Take 0.5 mg by mouth as needed.     . potassium chloride (K-DUR,KLOR-CON) 10 MEQ tablet Take 1 tablet (10 mEq total) by mouth daily. 90 tablet 3  . sertraline (ZOLOFT) 25 MG tablet Take 1 tablet (25 mg total) by mouth daily. (Patient not taking: Reported on 04/23/2015) 30 tablet 3   No current facility-administered medications for this visit.       Physical Exam:   BP 122/86 mmHg  Pulse 82  Resp 16  Ht 5\' 6"  (1.676 m)  Wt 224 lb (101.606 kg)  BMI 36.17 kg/m2  SpO2 98%  LMP 04/23/2015  General:  Obese but well appearing  Chest:   Clear to auscultation  CV:   Regular rate and rhythm  Incisions:  n/a  Abdomen:  Soft and nontender  Extremities:  Warm and well-perfused  Diagnostic Tests:  CARDIOPULMONARY EXERCISE TEST  Referred for: Exercise intolerance  Procedure: This patient underwent staged symptom-limited exercise treadmill testing using a modified Naughton protocol with expired gas analysis metabolic evaluation during exercise.  Demographics  Age: 49 Ht. (in.) 66 Wt. (lb) 223.8 BMI: 36.1   Predicted Peak VO2: 19.6 ml/kg/min  Gender: Female Ht (cm) 167.6 Wt. (kg) 101.5    Results  Pre-Exercise PFTs   FVC 3.19 (93%)     FEV1 2.56 (91%)      FEV1/FVC 80 (100%)      MVV 119 (117%)      Exercise Time:  11:04  Speed (mph): 3.0    Grade (%): 7.5    RPE: 18  Reason stopped: Patient ended test due to chest pressure (7/10) and dyspnea (8/10).  Additional symptoms: leg fatigue and lightheadedness (7/10).   Resting HR: 66 Peak HR: 169  (99% age predicted max HR)  BP rest: 124/78 BP peak: 176/76  Peak VO2: 18.9 (96.6% predicted peak VO2)  VE/VCO2 slope: 36.5  OUES: 1.76  Peak RER: 1.18  Ventilatory Threshold: 8.0 (40.9% predicted or measured peak VO2)  Peak RR 47  Peak Ventilation: 83.5  VE/MVV: 70.2%  PETCO2 at peak: 31  O2pulse: 11.3  (94% predicted O2pulse)   Interpretation  Notes: Patient gave a very good effort. Pulse-oximetry remained 99% throughout the exercise.   ECG: Resting ECG in normal sinus rhythm. The HR response was appropriate for the exercise performed. There were no sustained arrhythmias and no ST-T changes with exercise. The BP response was normal for the exercise performed.   PFT: Pre-exercise spirometry was within normal  limits. The MVV was excellent.   CPX: Exercise testing with gas exchange demonstrates a normal peak VO2 of 18.9 ml/kg/min (96.6% of the age/gender/weight matched sedentary norms). The RER of 1.18 indicates a maximal effort. When adjusted to the patient's ideal body weight of 145.9 lb (66.2 kg) the peak VO2 is 29 ml/kg (ibw)/min (109.6% of the ibw-adjusted predicted). The VE/VCO2 slope is elevated and indicates excessive dead space ventilation. The oxygen uptake efficiency slope (OUES) is low-normal and reflects the patient's measured functional capacity. The VO2 at the ventilatory threshold was low-normal at 40.9% of the predicted peak VO2. At peak exercise, the ventilation reached 70.2% of the measured MVV indicating ventilatory limits were beginning to approach. The O2pulse (a surrogate for stroke volume) increased with exercise intensity reaching 11.3 ml/beat (94% predicted).    Conclusion: Exercise testing with gas exchange demonstrates a normal functional capacity when compared to matched sedentary norms. At peak exercise patient is limited by her obesity and related ventilatory limitation. There is no obvious cardiac limitation though the elevated VE/VCO2 slope can be suggestive of diastolic dysfunction and/or possible pulmonary HTN. Exercise ECG did not show any signs of ischemia.    Test, report and preliminary impression by: Lesia Hausen, MS, ACSM-RCEP 05/02/2015 4:25 PM  I have edited the results with my findings.   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 She presents with progressive symptoms of exertional shortness of breath, orthopnea, and lower extremity edema consistent with chronic diastolic congestive heart failure. She has had some atypical chest discomfort off and on, but no clear symptoms of exertional chest pain suggestive of angina pectoris. Previous nuclear  stress test was felt to be low risk for ischemia, and exercise treadmill test performed last April was notable for the absence of development of exertional chest pain or EKG changes at peak exercise. I have personally reviewed the patient's cardiac gated CT angiogram of the heart performed December 2015. The right coronary artery courses between the aorta and the pulmonary artery. I have also reviewed the patient's recent transthoracic echocardiogram. This confirms the presence of normal left ventricular systolic function and no other significant abnormalities. Formal pulmonary exercise testing with gas exchange demonstrates a normal functional capacity. The patient appears to be limited primarily by her obesity and related ventilatory limitation. There is no evidence for myocardial ischemia with exercise.  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 Surgical options include "unroofing" of the proximal segment of the right coronary artery and  coronary artery bypass grafting, but efficacy of these procedures has never been clearly demonstrated. I would be very cautious to suggest that surgical intervention might have a high likelihood to improve the patient's symptoms of congestive heart failure.   Plan:  I have discussed options over the telephone with Dr. Delton See and with the patient in the office this afternoon. Under the circumstances I favor continued observation without surgical intervention at this time. I have encouraged the patient to get into the habit of regular exercise and to find a way to lose weight. She will continue to follow-up with Dr. Delton See and return to see [place] in the future only should further problems or difficulties arise.  I spent in excess  of 10 minutes during the conduct of this office consultation and >50% of this time involved direct face-to-face encounter with the patient for counseling and/or coordination of their care.   Salvatore Decent. Cornelius Moras, MD 05/21/2015 4:40 PM

## 2015-06-05 ENCOUNTER — Ambulatory Visit: Payer: Managed Care, Other (non HMO) | Admitting: Cardiology

## 2015-06-12 ENCOUNTER — Other Ambulatory Visit: Payer: Self-pay | Admitting: Cardiology

## 2015-07-25 ENCOUNTER — Telehealth: Payer: Self-pay | Admitting: Cardiology

## 2015-08-02 ENCOUNTER — Encounter: Payer: Self-pay | Admitting: *Deleted

## 2015-08-02 NOTE — Telephone Encounter (Signed)
Spoke with the pt and she is requesting a letter to be rewritten from back in August when Dr Delton SeeNelson put her on bedrest for her cardiac condition from 03/25/15 to 04/02/15.  Pt states this is for insurance purposes.  Rewrote the pts letter and had Dr Delton SeeNelson sign this.  Informed the pt this is at the front desk available to pick up.  Pt verbalized understanding and gracious for all the assistance provided.

## 2015-08-02 NOTE — Telephone Encounter (Signed)
New problem   Pt need to speak to you concerning picking up a work note that she spoke to you about last week. Please call pt.

## 2015-08-09 ENCOUNTER — Ambulatory Visit: Payer: Managed Care, Other (non HMO) | Attending: Internal Medicine | Admitting: Internal Medicine

## 2015-08-09 ENCOUNTER — Encounter: Payer: Self-pay | Admitting: Internal Medicine

## 2015-08-09 VITALS — BP 132/78 | HR 69 | Temp 98.6°F | Resp 16 | Ht 66.0 in | Wt 227.0 lb

## 2015-08-09 DIAGNOSIS — R6889 Other general symptoms and signs: Secondary | ICD-10-CM | POA: Diagnosis not present

## 2015-08-09 NOTE — Patient Instructions (Signed)

## 2015-08-09 NOTE — Progress Notes (Signed)
Patient complains of chills fatigue, aches all over Coughing up thick yellow mucous for the past couple days Patient states she did receive her flu vaccine this year

## 2015-08-09 NOTE — Progress Notes (Signed)
Patient ID: Monique Aguilar, female   DOB: 1966/03/25, 50 y.o.   MRN: [id number]  CC: body aches  HPI: Monique SimpsonCathy Aguilar is a 50 y.o. female here today for a follow up visit.  Patient has past medical history of HTN and CHF. Patient reports that she has been having symptoms of chills, fatigue, body aches, cough, yellow mucous production, nausea, light diarrhea for the past 3 days. 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 Patient has tried taking Nyquil, Mucinex, Alka-seltzer. She states that she now feels worse today than the the previous two days. She admits to having a flu shot this year.  No Known Allergies Past Medical History  Diagnosis Date  . Microcytic anemia     a. No prior workup.  . Migraines   . Low back pain   . Atypical chest pain     a. 10/2013: suspected GI etiology. Nuc - nonischemic, EF 39% but visually higher - f/u echo to clarify showed EF 50-55% with only mild LVH, no significant valvuar disease.  [name]. HTN (hypertension)   . Abnormal CT of the chest     a. 10/2013: 4mm RML pulm nodule, axillary lymphadenopathy. Instructed to f/u PCP.  [name]. Anomalous right coronary artery   . Chronic diastolic congestive heart failure [facility](H[facility])    Current Outpatient Prescriptions on File Prior to Visit  Medication Sig Dispense Refill  . aspirin 81 MG chewable tablet Chew by mouth daily.    [name]. aspirin-acetaminophen-caffeine (EXCEDRIN MIGRAINE) 250-250-65 MG per tablet Take 2 tablets by mouth every 6 (six) hours as needed for migraine.     [name]. atenolol (TENORMIN) 25 MG tablet TAKE 1 TABLET BY MOUTH DAILY. 90 tablet 3  . furosemide (LASIX) 40 MG tablet Take 1 tablet (40 mg total) by mouth daily. You may take 1 additional 40 mg tablet of lasix as needed for lower extremity edema 90 tablet 3  . ipratropium (ATROVENT) 0.06 % nasal spray Place 2 sprays into both nostrils 4 (four) times daily. 15 mL 1  . irbesartan (AVAPRO) 150 MG tablet Take 1 tablet (150 mg total) by mouth daily. 30  tablet 2  . LORazepam (ATIVAN) 0.5 MG tablet Take 0.5 mg by mouth as needed.     . potassium chloride (K-DUR,KLOR-CON) 10 MEQ tablet Take 1 tablet (10 mEq total) by mouth daily. 90 tablet 3  . sertraline (ZOLOFT) 25 MG tablet Take 1 tablet (25 mg total) by mouth daily. (Patient not taking: Reported on 04/23/2015) 30 tablet 3   No current facility-administered medications on file prior to visit.   Family History  Problem Relation Age of Onset  . Heart disease Father     Dx early 2240's (? heart valve)  . Congestive Heart Failure Mother   . CAD Neg Hx   . Heart attack Father [id number]   Social History   Social History  . Marital Status: Single    Spouse Name: N/A  . Number of Children: N/A  . Years of Education: N/A   Occupational History  . Not on file.   Social History Main Topics  . Smoking status: Never Smoker   . Smokeless tobacco: Never Used  . Alcohol Use: No  . Drug Use: No  . Sexual Activity: Not Currently    Birth Control/ Protection: Surgical   Other Topics Concern  . Not on file   Social History Narrative    Review of Systems: Other than what is stated in HPI,  all other systems are negative.   Objective:   Filed Vitals:   08/09/15 1616  BP: 132/78  Pulse: 69  Temp: 98.6 F (37 C)  Resp: 16    Physical Exam  Constitutional: She is oriented to person, place, and time.  HENT:  Right Ear: External ear normal.  Left Ear: External ear normal.  Mouth/Throat: Oropharynx is clear and moist.  Eyes: Right eye exhibits no discharge. Left eye exhibits no discharge.  Cardiovascular: Normal rate, regular rhythm and normal heart sounds.   Pulmonary/Chest: Effort normal and breath sounds normal.  Lymphadenopathy:    She has no cervical adenopathy.  Neurological: She is alert and oriented to person, place, and time.  Skin: Skin is warm and dry.     Lab Results  Component Value Date   WBC 7.1 04/16/2015   HGB 11.0* 04/16/2015   HCT 33.6* 04/16/2015   MCV 80.6  04/16/2015   PLT 284.0 04/16/2015   Lab Results  Component Value Date   CREATININE 1.15 04/16/2015   BUN 15 04/16/2015   NA 136 04/16/2015   K 3.6 04/16/2015   CL 101 04/16/2015   CO2 28 04/16/2015    Lab Results  Component Value Date   HGBA1C 5.2 12/16/2012   Lipid Panel     Component Value Date/Time   CHOL 151 10/11/2013 0125   TRIG 162* 10/11/2013 0125   HDL 48 10/11/2013 0125   CHOLHDL 3.1 10/11/2013 0125   VLDL 32 10/11/2013 0125   LDLCALC 71 10/11/2013 0125       Assessment and plan:   Monique Aguilar was seen today for generalized body aches.  Diagnoses and all orders for this visit:  Flu-like symptoms -     Influenza A & B PCR Multiple symptoms sound viral at this point. I will test patient. I have given her return precautions. If no improvement by next week she may call back for further instructions.Symptom management  Return if symptoms worsen or fail to improve.       Ambrose Finland, NP-C [facility] [phone] 08/09/2015, 4:30 PM

## 2015-08-10 ENCOUNTER — Ambulatory Visit: Payer: Self-pay | Admitting: Internal Medicine

## 2015-08-10 LAB — INFLUENZA A & B PCR
Influenza A: NOT DETECTED
Influenza B: NOT DETECTED

## 2015-08-14 ENCOUNTER — Telehealth: Payer: Self-pay

## 2015-08-14 ENCOUNTER — Telehealth: Payer: Self-pay | Admitting: Internal Medicine

## 2015-08-14 NOTE — Telephone Encounter (Signed)
-----   Message from Ambrose FinlandValerie A Keck, NP sent at 08/12/2015  5:19 PM EST ----- Not Influenza. Find out if she is feeling better, if not find out if fevers, chills, and other symptoms.

## 2015-08-14 NOTE — Telephone Encounter (Signed)
Tried to call patient to find out what symptoms she is still experiencing Patient not available Message left on voice mail to return our call

## 2015-08-14 NOTE — Telephone Encounter (Signed)
Returned call to patient  Patient states she is still not feeling well Stated a nurse at her job listened to her lungs and told her She was having some wheezing to the left side Still having some SOB Also stated when she breathes deep she is having some pain Patient has not had a chest xray Please advise

## 2015-08-14 NOTE — Telephone Encounter (Signed)
Nurse called patient, reached voicemail. Left message for patient to call [facility] at [phone]. Patient called [facility] yesterday when office was closed.  Per [facility] note: "Caller states she was diagnosed with a viral infection this past Thursday. She states she has not gotten better since Thursday. She states she is feeling worse. She states she has cough and congestion and body aches. Fever was 99 this morning per caller and she states she is having chills ans she is just [place]." Nurse called patient ot see how she is feeling today.

## 2015-08-14 NOTE — Telephone Encounter (Signed)
Pt. Called stating that she has the Flu, and came in last week for an appointment and does not feel well. Pt. Would like to be prescribed a medication. Please f/u with pt.

## 2015-08-14 NOTE — Telephone Encounter (Signed)
Called patient this am  Patient not available Left message with receptionist to have return our call

## 2015-08-15 ENCOUNTER — Telehealth: Payer: Self-pay

## 2015-08-15 ENCOUNTER — Other Ambulatory Visit: Payer: Self-pay | Admitting: Internal Medicine

## 2015-08-15 DIAGNOSIS — R0602 Shortness of breath: Secondary | ICD-10-CM

## 2015-08-15 MED ORDER — AZITHROMYCIN 250 MG PO TABS: ORAL_TABLET | ORAL | Status: DC

## 2015-08-15 MED FILL — ?AZITHROMYCIN 250 MG TABLET: 250 MG | 5 days supply | Qty: 6 | Fill #0

## 2015-08-15 NOTE — Telephone Encounter (Signed)
Tried to contact patient  Dialed work number first -no one picked up Phone just rang and rang Called cell number and left voice message to return our call

## 2015-08-15 NOTE — Telephone Encounter (Signed)
I will place order for chest xray. If she gets it done today we can likely call her with results in the AM. I will also send a antibiotic

## 2015-08-17 ENCOUNTER — Ambulatory Visit: Payer: Self-pay | Admitting: Internal Medicine

## 2015-09-12 ENCOUNTER — Telehealth: Payer: Self-pay | Admitting: Cardiology

## 2015-09-12 NOTE — Telephone Encounter (Signed)
Pt calling to request Dr Delton See to write her a work letter stating that due to her current cardiac status and taking lasix for LEE, she cannot work every weekend, only every other weekend.  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  Pts job is requesting that she have a written note from Dr Delton See stating that she is unable to work every weekend, but can work every other weekend.  Informed the pt that I will send this message to Dr Delton See for further review and recommendation, and follow-up with the pt thereafter.  Pt verbalized understanding and agrees with this plan.

## 2015-09-12 NOTE — Telephone Encounter (Signed)
Informed the pt that per Dr Delton See, she cannot write her that letter and she recommends that the pt be compliant with taking her lasix and use compression stockings as needed.  Informed the pt that I left her a script for compression stockings at the front desk to pick up.  Informed the pt that she can take this to any medical supply store and have them fit her appropriately for her stockings.  Advised the pt to wear the stockings when she is on her feet or working for an extended period of time  Pt verbalized understanding and agrees with this plan.

## 2015-09-12 NOTE — Telephone Encounter (Signed)
New message ° ° ° ° ° ° °Talk to the nurse.  Pt would not tell me what she wanted °

## 2015-09-12 NOTE — Telephone Encounter (Signed)
I cant write her that letter, she should just be compliant to her lasix. Use compression stockings if needed

## 2015-11-09 MED FILL — CLINDAMYCIN HCL 150 MG CAPS: 150 | 7 days supply | Qty: 28 | Fill #0

## 2015-11-09 MED FILL — ?ATENOLOL 25 MG TABLET: 25 | 30 days supply | Qty: 30 | Fill #1

## 2015-11-09 MED FILL — IRBESARTAN 150 MG TABLET: 150 | 30 days supply | Qty: 30 | Fill #2

## 2016-02-08 ENCOUNTER — Other Ambulatory Visit: Payer: Self-pay | Admitting: Internal Medicine

## 2016-02-21 MED FILL — IRBESARTAN 150 MG TABLET: 150 | 30 days supply | Qty: 30 | Fill #0

## 2016-02-21 MED FILL — ?ATENOLOL 25 MG TABLET: 25 | 30 days supply | Qty: 30 | Fill #2

## 2016-03-14 ENCOUNTER — Other Ambulatory Visit: Payer: Self-pay | Admitting: Nurse Practitioner

## 2016-03-14 ENCOUNTER — Telehealth: Payer: Self-pay | Admitting: Cardiology

## 2016-03-14 DIAGNOSIS — I509 Heart failure, unspecified: Secondary | ICD-10-CM

## 2016-03-14 DIAGNOSIS — Q245 Malformation of coronary vessels: Secondary | ICD-10-CM

## 2016-03-14 DIAGNOSIS — E785 Hyperlipidemia, unspecified: Secondary | ICD-10-CM

## 2016-03-14 DIAGNOSIS — I10 Essential (primary) hypertension: Secondary | ICD-10-CM

## 2016-03-14 DIAGNOSIS — I209 Angina pectoris, unspecified: Secondary | ICD-10-CM

## 2016-03-14 MED ORDER — FUROSEMIDE 40 MG PO TABS: 40.0000 mg | ORAL_TABLET | Freq: Two times a day (BID) | ORAL | 11 refills | Status: DC

## 2016-03-14 NOTE — Telephone Encounter (Signed)
Per pt call:    Swelling around knee has worsend for about a week now, pt thinks it is fluid.  Pt would like to know wether or not to go to the ER.  [phone]  pt would like to be paged at work when calling back.

## 2016-03-14 NOTE — Telephone Encounter (Signed)
Please increase her lasix to 40 mg po BID and have her see a PA in the office the next week. If she gets worse, she should go to the ER.

## 2016-03-14 NOTE — Telephone Encounter (Signed)
Reviewed Dr. Lindaann SloughNelson's advice with patient who verbalized understanding and agreement to increase lasix to 40 mg BID and to go to the ED if SOB and/or swelling worsens.  She requests appointment on Wednesday because it is her day off.  She is scheduled with K. Janee Mornhompson, [place] on 8/16.  I advised her to call back with questions or concerns prior to follow-up.

## 2016-03-14 NOTE — Telephone Encounter (Signed)
Spoke with patient who states she has fluid around her right knee right fluid x 3-4 days.  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 C/o mild SOB; currently taking Lasix 40 mg once daily. States she is compliant with all other medications.    C/o burning in center of chest described as "heart burn" States she had to take the compression stockings off because they are too tight.  I advised her that I will route message to Dr. Delton SeeNelson, who is working in the hospital today and will call her back with Dr. Lindaann SloughNelson's advice.  She verbalized understanding and agreement.

## 2016-03-18 NOTE — Progress Notes (Signed)
Cardiology Office Note    Date:  03/19/2016   ID:  Monique LintCathy A Plessinger, DOB 11/03/1965, MRN [id number]  PCP:  Ambrose FinlandValerie A Keck, NP  Cardiologist:  Dr. Delton SeeNelson  CC: LE edema, SOB  History of Present Illness:  Monique Aguilar is a 50 y.o. 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 Cornelius Moraswen), chronic diastolic CHF, HTN, and obesity who presents to clinic for evaluation of LE edema and worsening SOB.   She is followed by Dr. Delton SeeNelson for a hx of atypical chest pain and dyspnea. 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 The patient was advised to undergo an exercise treadmill stress test to assess for exertional ischemia from systolic inter-arterial compression. This was performed and showed no signs of ischemia. She was referred to Dr. Cornelius Moraswen for surgical assessment.   She was seen by Dr. Cornelius Moraswen in 05/2015. He wrote: "The presence of anomalous right coronary artery arising off of the left sinus of Valsalva and has been associated with history of acute myocardial infarction and sudden cardiac death in anecdotal case reports, but to a far lesser degree than patients with anomalous left coronary artery. Surgical options include "unroofing" of the proximal segment of the right coronary artery and coronary artery bypass grafting, but efficacy of these procedures has never been clearly demonstrated. I would be very cautious to suggest that surgical intervention might have a high likelihood to improve the patient's symptoms of congestive heart failure."  He also noted that she had a previous exercise nuclear stress test with no ischemia and formal pulmonary exercise testing with gas exchange that demonstrated a normal functional capacity. The patient appeared to be limited primarily by her obesity and related  ventilatory limitation. There was no evidence for myocardial ischemia with exercise. He favored weight loss and continued monitoring at that time.   Review of phone notes shows that she called in 03/14/16 for increased swelling and SOB. Her lasix 40mg  daily was increased to 40mg  BID and she was added onto my clinic schedule.   Today she presents to clinic for follow up. She has not had much more UOP with increased lasix and weight has not gone down that much. She did have a pocket of fluid on her right knee that has gotten better. She chronically lays on two pillows for comfort, but no PND. She has had some LE cramping as well. No palpitations. No dizziness or syncope. Sometimes she feels a burning in her chest after she eats but no exertional chest pain. She weighs herself daily and she was 132 lbs this AM, which is ~ 10 lbs more than her dry weight of 220 lbs. She hasnt been eating any differently then usual and watches her salt.      Past Medical History:  Diagnosis Date  . Abnormal CT of the chest    a. 10/2013: 4mm RML pulm nodule, axillary lymphadenopathy. Instructed to f/u PCP.  [name]. Anomalous right coronary artery   . Atypical chest pain    a. 10/2013: suspected GI etiology. Nuc - nonischemic, EF 39% but visually higher - f/u echo to clarify showed EF 50-55% with only mild LVH, no significant valvuar disease.  . Chronic diastolic congestive heart failure (HCC)   . HTN (hypertension)   . Low  back pain   . Microcytic anemia    a. No prior workup.  [name]. Migraines     Past Surgical History:  Procedure Laterality Date  . CESAREAN SECTION  1988  . TONSILLECTOMY  1992?  . TUBAL LIGATION  1995    Current Medications: Outpatient Medications Prior to Visit  Medication Sig Dispense Refill  . aspirin 81 MG chewable tablet Chew by mouth daily.    [name]. aspirin-acetaminophen-caffeine (EXCEDRIN MIGRAINE) 250-250-65 MG per tablet Take 2 tablets by mouth every 6 (six) hours as needed for migraine.       [name]. atenolol (TENORMIN) 25 MG tablet TAKE 1 TABLET BY MOUTH DAILY. 90 tablet 3  . furosemide (LASIX) 40 MG tablet Take 1 tablet (40 mg total) by mouth 2 (two) times daily. 60 tablet 11  . irbesartan (AVAPRO) 150 MG tablet Take 1 tablet (150 mg total) by mouth daily. Must have office visit for refills 30 tablet 0  . potassium chloride (K-DUR,KLOR-CON) 10 MEQ tablet Take 1 tablet (10 mEq total) by mouth daily. 90 tablet 3  . azithromycin (ZITHROMAX) 250 MG tablet Take 2 pills today, then 1 pill each day after until complete 6 tablet 0  . ipratropium (ATROVENT) 0.06 % nasal spray Place 2 sprays into both nostrils 4 (four) times daily. 15 mL 1  . LORazepam (ATIVAN) 0.5 MG tablet Take 0.5 mg by mouth as needed for anxiety.     . sertraline (ZOLOFT) 25 MG tablet Take 1 tablet (25 mg total) by mouth daily. 30 tablet 3   No facility-administered medications prior to visit.      Allergies:   Review of patient's allergies indicates no known allergies.   Social History   Social History  . Marital status: Single    Spouse name: N/A  . Number of children: N/A  . Years of education: N/A   Social History Main Topics  . Smoking status: Never Smoker  . Smokeless tobacco: Never Used  . Alcohol use No  . Drug use: No  . Sexual activity: Not Currently    Birth control/ protection: Surgical   Other Topics Concern  . None   Social History Narrative  . None     Family History:  The patient's family history includes Congestive Heart Failure in her mother; Heart attack (age of onset: 7471) in her father; Heart disease in her father.     ROS:   Please see the history of present illness.    ROS All other systems reviewed and are negative.   PHYSICAL EXAM:   VS:  BP 126/86   Pulse 66   Ht 5\' 6"  (1.676 m)   Wt 233 lb 12.8 oz (106.1 kg)   BMI 37.74 kg/m    GEN: Well nourished, well developed, in no acute distress  HEENT: normal  Neck: no JVD, carotid bruits, or masses Cardiac: RRR; no murmurs,  rubs, or gallops,no edema  Respiratory:  clear to auscultation bilaterally, normal work of breathing GI: soft, nontender, nondistended, + BS MS: no deformity or atrophy  Skin: warm and dry, no rash Neuro:  Alert and Oriented x 3, Strength and sensation are intact Psych: euthymic mood, full affect  Wt Readings from Last 3 Encounters:  03/19/16 233 lb 12.8 oz (106.1 kg)  08/09/15 227 lb (103 kg)  05/21/15 224 lb (101.6 kg)      Studies/Labs Reviewed:   EKG:  EKG is ordered today.  The ekg ordered today demonstrates NSR HR 66.   Recent  Labs: 04/16/2015: ALT 15; BUN 15; Creatinine, Ser 1.15; Hemoglobin 11.0; Platelets 284.0; Potassium 3.6; Pro B Natriuretic peptide (BNP) 78.0; Sodium 136; TSH 1.38   Lipid Panel    Component Value Date/Time   CHOL 151 10/11/2013 0125   TRIG 162 (H) 10/11/2013 0125   HDL 48 10/11/2013 0125   CHOLHDL 3.1 10/11/2013 0125   VLDL 32 10/11/2013 0125   LDLCALC 71 10/11/2013 0125    Additional studies/ records that were reviewed today include:  CPX 04/2015 Conclusion: Exercise testing with gas exchange demonstrates a normal functional capacity when compared to matched sedentary norms. At peak exercise patient is limited by her obesity and related ventilatory limitation. There is no obvious cardiac limitation though the elevated VE/VCO2 slope can be suggestive of diastolic dysfunction and/or possible pulmonary HTN. Exercise ECG did not show any signs of ischemia.  2D ECHO: 04/19/2015 LV EF: 55% -   60% Study Conclusions - Left ventricle: The cavity size was normal. Wall thickness was   normal. Systolic function was normal. The estimated ejection   fraction was in the range of 55% to 60%. Wall motion was normal;   there were no regional wall motion abnormalities. Left   ventricular diastolic function parameters were normal.  Cardiac CT 07/2014 IMPRESSION: 1. Coronary calcium score of 0. This was 0 percentile for age and sex matched control. 2.  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 Systolic inter-arterial compression can't be excluded on this study. 3.  Right dominance.  No evidence of CAD. An exercise treadmill stress test will be scheduled to evaluate for exertional ischemia - ECG changes and symptoms suggestive of systolic inter-arterial compression.  ASSESSMENT & PLAN:   Acute on chronic diastolic CHF: weight is about 6 lbs up since January by our scales (227--> 233lbs). She weighs herself daily and she was 132 lbs this AM on home scale, which is ~ 10 lbs more than her dry weight of 220 lbs. Doing okay on lasix 40mg  BID. She does not appear volume overloaded to me today but patient is adamant that she is still 10 lbs over her dry weight. WIll check BMET and BNP. If BNP elevated, we will increase her lasix to 60mg  BID to try to get better UOP. Otherwise, I will keep her at 40mg  BID for now.   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 Followed by Dr. Cornelius Moras and no indication for surgery at this time.  Hypertensive heart disease with heart failure: BP well controlled on current regimen   Obesity: diet and weight loss recommended. Body mass index is 37.74 kg/m.    Medication Adjustments/Labs and Tests Ordered: Current medicines are reviewed at length with the patient today.  Concerns regarding medicines are outlined above.  Medication changes, Labs and Tests ordered today are listed in the Patient Instructions below. Patient Instructions  Medication Instructions:  Your physician recommends that you continue on your current medications as directed. Please refer to the Current Medication list given to you today.   Labwork: None ordered  Testing/Procedures:   Follow-Up: Your physician recommends that you schedule a follow-up appointment in: 2  MONTHS WITH DR. Delton See   Any Other Special Instructions Will Be Listed Below (If Applicable).     If you need a refill on your cardiac medications before your next appointment, please call your  pharmacy.      Byrd Hesselbach, PA-C  03/19/2016 11:52 AM    [facility] [address] [place], [place], [place]  [address] Phone: [phone]; Fax: [phone]

## 2016-03-19 ENCOUNTER — Ambulatory Visit (INDEPENDENT_AMBULATORY_CARE_PROVIDER_SITE_OTHER): Payer: Managed Care, Other (non HMO) | Admitting: Physician Assistant

## 2016-03-19 ENCOUNTER — Encounter: Payer: Self-pay | Admitting: Physician Assistant

## 2016-03-19 ENCOUNTER — Encounter (INDEPENDENT_AMBULATORY_CARE_PROVIDER_SITE_OTHER): Payer: Self-pay

## 2016-03-19 VITALS — BP 126/86 | HR 66 | Ht 66.0 in | Wt 233.8 lb

## 2016-03-19 DIAGNOSIS — I110 Hypertensive heart disease with heart failure: Secondary | ICD-10-CM | POA: Diagnosis not present

## 2016-03-19 DIAGNOSIS — R0602 Shortness of breath: Secondary | ICD-10-CM | POA: Diagnosis not present

## 2016-03-19 DIAGNOSIS — Q245 Malformation of coronary vessels: Secondary | ICD-10-CM

## 2016-03-19 DIAGNOSIS — I5031 Acute diastolic (congestive) heart failure: Secondary | ICD-10-CM

## 2016-03-19 DIAGNOSIS — E785 Hyperlipidemia, unspecified: Secondary | ICD-10-CM | POA: Diagnosis not present

## 2016-03-19 LAB — BASIC METABOLIC PANEL
BUN: 10 mg/dL (ref 7–25)
CHLORIDE: 105 mmol/L (ref 98–110)
CO2: 25 mmol/L (ref 20–31)
Calcium: 9.1 mg/dL (ref 8.6–10.4)
Creat: 1.07 mg/dL — ABNORMAL HIGH (ref 0.50–1.05)
Glucose, Bld: 65 mg/dL (ref 65–99)
Potassium: 4.1 mmol/L (ref 3.5–5.3)
Sodium: 138 mmol/L (ref 135–146)

## 2016-03-19 LAB — BRAIN NATRIURETIC PEPTIDE: Brain Natriuretic Peptide: 61.1 pg/mL (ref <100)

## 2016-03-19 NOTE — Patient Instructions (Addendum)
Medication Instructions:  Your physician recommends that you continue on your current medications as directed. Please refer to the Current Medication list given to you today.   Labwork: TODAY:  BMP & BNP  Testing/Procedures: None ordered  Follow-Up: Your physician recommends that you schedule a follow-up appointment in: 2 MONTHS WITH DR. Delton SeeNELSON   Any Other Special Instructions Will Be Listed Below (If Applicable).     If you need a refill on your cardiac medications before your next appointment, please call your pharmacy.

## 2016-03-21 ENCOUNTER — Telehealth: Payer: Self-pay | Admitting: Cardiology

## 2016-03-21 DIAGNOSIS — I209 Angina pectoris, unspecified: Secondary | ICD-10-CM

## 2016-03-21 DIAGNOSIS — I10 Essential (primary) hypertension: Secondary | ICD-10-CM

## 2016-03-21 DIAGNOSIS — Q245 Malformation of coronary vessels: Secondary | ICD-10-CM

## 2016-03-21 DIAGNOSIS — I509 Heart failure, unspecified: Secondary | ICD-10-CM

## 2016-03-21 DIAGNOSIS — E785 Hyperlipidemia, unspecified: Secondary | ICD-10-CM

## 2016-03-21 MED ORDER — FUROSEMIDE 40 MG PO TABS: 40.0000 mg | ORAL_TABLET | Freq: Every day | ORAL | 3 refills | Status: DC

## 2016-03-21 NOTE — Telephone Encounter (Signed)
-----   Message from Janetta HoraKathryn R Thompson, PA-C sent at 03/19/2016  7:50 PM EDT ----- Her BNP is totally normal arguing against volume overload.  Would not increase lasix any further. I would actually bring back down to 40mg  daily.

## 2016-03-21 NOTE — Telephone Encounter (Signed)
Mrs. Monique Aguilar is returning a call about her test results. Please leave the results on her voicemail , because she is at work at this time . Thanks

## 2016-05-06 ENCOUNTER — Other Ambulatory Visit: Payer: Self-pay | Admitting: Internal Medicine

## 2016-05-14 ENCOUNTER — Other Ambulatory Visit: Payer: Self-pay | Admitting: Internal Medicine

## 2016-05-23 MED FILL — ATENOLOL 50 MG TABLET: 50 | 30 days supply | Qty: 15 | Fill #0

## 2016-06-03 MED FILL — IRBESARTAN 150 MG TABLET: 150 | 30 days supply | Qty: 30 | Fill #0

## 2016-06-04 ENCOUNTER — Other Ambulatory Visit: Payer: Self-pay | Admitting: Cardiology

## 2016-06-04 MED FILL — POTASSIUM CL 10 MEQ TAB SA: 10 | 30 days supply | Qty: 30 | Fill #0

## 2016-07-07 ENCOUNTER — Other Ambulatory Visit: Payer: Self-pay | Admitting: Cardiology

## 2016-07-07 DIAGNOSIS — I509 Heart failure, unspecified: Secondary | ICD-10-CM

## 2016-07-07 DIAGNOSIS — E785 Hyperlipidemia, unspecified: Secondary | ICD-10-CM

## 2016-07-07 DIAGNOSIS — I10 Essential (primary) hypertension: Secondary | ICD-10-CM

## 2016-07-07 DIAGNOSIS — Q245 Malformation of coronary vessels: Secondary | ICD-10-CM

## 2016-07-07 DIAGNOSIS — I209 Angina pectoris, unspecified: Secondary | ICD-10-CM

## 2016-07-26 ENCOUNTER — Emergency Department (HOSPITAL_COMMUNITY): Payer: Managed Care, Other (non HMO)

## 2016-07-26 ENCOUNTER — Emergency Department (HOSPITAL_COMMUNITY)
Admission: EM | Admit: 2016-07-26 | Discharge: 2016-07-26 | Disposition: A | Discharge status: Home / Self Care | Payer: Managed Care, Other (non HMO) | Attending: Emergency Medicine | Admitting: Emergency Medicine

## 2016-07-26 ENCOUNTER — Encounter (HOSPITAL_COMMUNITY): Payer: Self-pay | Admitting: Certified Nurse Midwife

## 2016-07-26 DIAGNOSIS — R338 Other retention of urine: Secondary | ICD-10-CM

## 2016-07-26 DIAGNOSIS — Z7982 Long term (current) use of aspirin: Secondary | ICD-10-CM | POA: Insufficient documentation

## 2016-07-26 DIAGNOSIS — I5032 Chronic diastolic (congestive) heart failure: Secondary | ICD-10-CM | POA: Insufficient documentation

## 2016-07-26 DIAGNOSIS — Z79899 Other long term (current) drug therapy: Secondary | ICD-10-CM | POA: Diagnosis not present

## 2016-07-26 DIAGNOSIS — R0789 Other chest pain: Secondary | ICD-10-CM | POA: Insufficient documentation

## 2016-07-26 DIAGNOSIS — I110 Hypertensive heart disease with heart failure: Secondary | ICD-10-CM | POA: Insufficient documentation

## 2016-07-26 DIAGNOSIS — R339 Retention of urine, unspecified: Secondary | ICD-10-CM | POA: Diagnosis not present

## 2016-07-26 DIAGNOSIS — R0602 Shortness of breath: Secondary | ICD-10-CM | POA: Diagnosis present

## 2016-07-26 LAB — BASIC METABOLIC PANEL
Anion gap: 8 (ref 5–15)
BUN: 9 mg/dL (ref 6–20)
CALCIUM: 9.6 mg/dL (ref 8.9–10.3)
CO2: 23 mmol/L (ref 22–32)
CREATININE: 1.28 mg/dL — AB (ref 0.44–1.00)
Chloride: 104 mmol/L (ref 101–111)
GFR calc Af Amer: 55 mL/min — ABNORMAL LOW (ref >60)
GFR, EST NON AFRICAN AMERICAN: 48 mL/min — AB (ref >60)
GLUCOSE: 119 mg/dL — AB (ref 65–99)
Potassium: 3.9 mmol/L (ref 3.5–5.1)
Sodium: 135 mmol/L (ref 135–145)

## 2016-07-26 LAB — CBC
HCT: 35.8 % — ABNORMAL LOW (ref 36.0–46.0)
HEMOGLOBIN: 11.4 g/dL — AB (ref 12.0–15.0)
MCH: 25.9 pg — AB (ref 26.0–34.0)
MCHC: 31.8 g/dL (ref 30.0–36.0)
MCV: 81.4 fL (ref 78.0–100.0)
PLATELETS: 299 10*3/uL (ref 150–400)
RBC: 4.4 MIL/uL (ref 3.87–5.11)
RDW: 14.4 % (ref 11.5–15.5)
WBC: 4.6 10*3/uL (ref 4.0–10.5)

## 2016-07-26 LAB — I-STAT TROPONIN, ED
TROPONIN I, POC: 0 ng/mL (ref 0.00–0.08)
Troponin i, poc: 0 ng/mL (ref 0.00–0.08)

## 2016-07-26 LAB — BRAIN NATRIURETIC PEPTIDE: B Natriuretic Peptide: 32.1 pg/mL (ref 0.0–100.0)

## 2016-07-26 MED ORDER — FUROSEMIDE 20 MG PO TABS
60.0000 mg | ORAL_TABLET | Freq: Once | ORAL | Status: AC
  Administered 2016-07-26: 60 mg via ORAL
  Filled 2016-07-26: qty 3

## 2016-07-26 NOTE — ED Notes (Signed)
Ambulated pt SpO2 levels stayed between 98% to 100%. No issues.

## 2016-07-26 NOTE — ED Triage Notes (Signed)
Pt states she has central chest pain since last night. Pt states she takes Lasix for HBP and has not urinated since yesterday at 1430. Pt states she has a productive cough.

## 2016-07-26 NOTE — Discharge Instructions (Signed)
Please take an additional Lasix 60mg  tomorrow and then follow up with your primary care provider for further evaluation of your kidney function and urinary retention.  Return to the ER if you have any concerns.

## 2016-07-26 NOTE — ED Provider Notes (Signed)
[id number] ClusterGregor HamsNelly Rout Jamal CollinLehman BrothersConsulting civil engineerKristine [id number] ClusterGregor HamsNelly Rout Jamal CollinLehman BrothersConsulting civil engineerKristine [id number] ClusterGregor HamsNelly Rout Jamal Collin  TABLET (10 MEQ TOTAL) BY MOUTH DAILY. 06/04/16   Lars Masson, MD    Family History Family History  Problem Relation Age of Onset  . Heart disease Father     Dx early 21's (? heart valve)  . Heart attack Father 60  . Congestive Heart Failure Mother   . CAD Neg Hx     Social History Social History  Substance Use Topics  . Smoking status: Never Smoker  . Smokeless tobacco: Never Used  . Alcohol use No     Allergies   Patient has no known allergies.   Review of Systems Review of Systems  All other systems reviewed and are  negative.    Physical Exam Updated Vital Signs BP 123/82 (BP Location: Left Arm)   Pulse 69   Temp 97.7 F (36.5 C) (Oral)   Resp 16   Ht 5\' 6"  (1.676 m)   Wt 99.8 kg   LMP 07/08/2016   SpO2 100%   BMI 35.51 kg/m   Physical Exam  Constitutional: She is oriented to person, place, and time. She appears well-developed and well-nourished. No distress.  Obese female resting in bed in no acute discomfort. Nontoxic in appearance  HENT:  Head: Atraumatic.  Eyes: Conjunctivae are normal.  Neck: Neck supple. No JVD present.  Cardiovascular: Normal rate, regular rhythm and intact distal pulses.   Pulmonary/Chest: Effort normal and breath sounds normal. No respiratory distress. She has no wheezes. She has no rales. She exhibits no tenderness.  Abdominal: Soft. There is no tenderness.  Musculoskeletal: She exhibits edema (Trace 1+ pitting edema to bilateral lower extremities without palpable cords or erythema).  Neurological: She is alert and oriented to person, place, and time.  Skin: No rash noted.  Psychiatric: She has a normal mood and affect.  Nursing note and vitals reviewed.    ED Treatments / Results  Labs (all labs ordered are listed, but only abnormal results are displayed) Labs Reviewed  BASIC METABOLIC PANEL - Abnormal; Notable for the following:       Result Value   Glucose, Bld 119 (*)    Creatinine, Ser 1.28 (*)    GFR calc non Af Amer 48 (*)    GFR calc Af Amer 55 (*)    All other components within normal limits  CBC - Abnormal; Notable for the following:    Hemoglobin 11.4 (*)    HCT 35.8 (*)    MCH 25.9 (*)    All other components within normal limits  BRAIN NATRIURETIC PEPTIDE  I-STAT TROPOININ, ED  I-STAT TROPOININ, ED    EKG  EKG Interpretation  Date/Time:  Saturday July 26 2016 11:34:20 EST Ventricular Rate:  85 PR Interval:  160 QRS Duration: 80 QT Interval:  354 QTC Calculation: 421 R Axis:   77 Text Interpretation:  Normal sinus  rhythm Normal ECG No significant change since last tracing Confirmed by [facility] MD, ERIN ([id number]) on 07/26/2016 3:47:40 PM       Radiology Dg Chest 2 View  Result Date: 07/26/2016 CLINICAL DATA:  Coughing.  Shortness of breath. EXAM: CHEST  2 VIEW COMPARISON:  05/28/2014 FINDINGS: The heart size and mediastinal contours are within normal limits. Both lungs are clear. The visualized skeletal structures are unremarkable. IMPRESSION: No active cardiopulmonary disease. Electronically Signed   By: Richarda Overlie M.D.   On: 07/26/2016 12:27    Procedures Procedures (including critical care time)  Medications Ordered in ED Medications  furosemide (LASIX) tablet 60 mg (60  mg Oral Given 07/26/16 1751)     Initial Impression / Assessment and Plan / ED Course  I have reviewed the triage vital signs and the nursing notes.  Pertinent labs & imaging results that were available during my care of the patient were reviewed by me and considered in my medical decision making (see chart for details).  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 Had a cardiac CT in 07/2014 which shows no CAD, calcium score of zero.  Her exercise treadmill stress test at that time without evidence of ischemia.    Has hx of CHF currently on Lasix.  Symptoms suggestive of CHF exacerbation. Previous cardiac workup has been unremarkable. Workup initiated.  3:50 PM EKG without acute ischemic changes.  Normal BNP, does not suggest acute CHF.  Pt doesn't have any significant risk factors for PE.    5:40 PM Pt voice concern of urinary  retention.  sts she had similar event in the past and her PCP recommend laxis and it has helped.  She denies urinary urges, and when she tries to urinate, she report no urine.  I performed an US bladder scan with 153ml of retained urine.  I discussed with Dr. Dalene SeltzerSchlossman, at this time we are hesitant in inserting a foley.  Plan go upped her Lasix 60mg  today and once tomorrow.  Then have pt f/u with PCP for renal function check.  Pt otherwise abulate without difficulty, no hypoxia.  Checking delta trop.    6:45 PM Negative delta trop. Pt received Lasix.  She will f/u for further care.  Return precaution discussed.    Fayrene HelperBowie Nayelis Bonito, PA-C 07/26/16 1847    Alvira MondayErin Schlossman, MD 07/28/16 [id number]

## 2016-08-06 MED FILL — ?FUROSEMIDE 40 MG TABLET: 40 | 30 days supply | Qty: 30 | Fill #0

## 2016-08-15 ENCOUNTER — Ambulatory Visit: Payer: BLUE CROSS/BLUE SHIELD | Attending: Family Medicine | Admitting: Family Medicine

## 2016-08-15 ENCOUNTER — Encounter: Payer: Self-pay | Admitting: Family Medicine

## 2016-08-15 VITALS — BP 133/81 | HR 58 | Temp 98.3°F | Resp 16 | Wt 231.0 lb

## 2016-08-15 DIAGNOSIS — N6320 Unspecified lump in the left breast, unspecified quadrant: Secondary | ICD-10-CM

## 2016-08-15 DIAGNOSIS — N289 Disorder of kidney and ureter, unspecified: Secondary | ICD-10-CM

## 2016-08-15 DIAGNOSIS — G43009 Migraine without aura, not intractable, without status migrainosus: Secondary | ICD-10-CM

## 2016-08-15 DIAGNOSIS — Z79899 Other long term (current) drug therapy: Secondary | ICD-10-CM | POA: Diagnosis not present

## 2016-08-15 DIAGNOSIS — Z7982 Long term (current) use of aspirin: Secondary | ICD-10-CM | POA: Diagnosis not present

## 2016-08-15 DIAGNOSIS — R51 Headache: Secondary | ICD-10-CM | POA: Diagnosis present

## 2016-08-15 LAB — BASIC METABOLIC PANEL WITH GFR
BUN: 16 mg/dL (ref 7–25)
CALCIUM: 9.1 mg/dL (ref 8.6–10.4)
CHLORIDE: 103 mmol/L (ref 98–110)
CO2: 26 mmol/L (ref 20–31)
CREATININE: 1.05 mg/dL (ref 0.50–1.05)
GFR, Est African American: 71 mL/min (ref >=60)
GFR, Est Non African American: 62 mL/min (ref >=60)
Glucose, Bld: 79 mg/dL (ref 65–99)
Potassium: 4.1 mmol/L (ref 3.5–5.3)
Sodium: 135 mmol/L (ref 135–146)

## 2016-08-15 MED ORDER — SUMATRIPTAN SUCCINATE 25 MG PO TABS: 25.0000 mg | ORAL_TABLET | ORAL | 0 refills | Status: DC | PRN

## 2016-08-15 MED ORDER — AMITRIPTYLINE HCL 25 MG PO TABS: ORAL_TABLET | ORAL | 0 refills | Status: DC

## 2016-08-15 MED FILL — AMITRIPTYLINE HCL 25 MG TAB: 25 | 30 days supply | Qty: 60 | Fill #0

## 2016-08-15 NOTE — Assessment & Plan Note (Signed)
Migraine Elavil for prevent Imitrex for treatment Trigger avoidance and increase exercise

## 2016-08-15 NOTE — Assessment & Plan Note (Signed)
Slightly tender mass on exam Breast ultrasound and mammogram ordered

## 2016-08-15 NOTE — Patient Instructions (Signed)
[name] was seen today for headache and breast mass.  Diagnoses and all orders for this visit:  Renal insufficiency -     BASIC METABOLIC PANEL WITH GFR  Left breast mass -     MM DIAG BREAST TOMO BILATERAL; Future -     US BREAST LTD UNI LEFT INC AXILLA; Future  Migraine without aura and without status migrainosus, not intractable -     amitriptyline (ELAVIL) 25 MG tablet; Take by mouth 12.5 mg nightly for one week, then 25 mg nightly for one week, then 50 mg nightly -     SUMAtriptan (IMITREX) 25 MG tablet; Take 1 tablet (25 mg total) by mouth every 2 (two) hours as needed for migraine. May repeat in 2 hours if headache persists or recurs. Do not exceed 2 doses in one week.   F/u in 4 weeks for migraine   Dr. Armen PickupFunches  Recurrent Migraine Headache A migraine headache is very bad, throbbing pain on one or both sides of your head. Recurrent migraines keep coming back. Talk to your doctor about what things may bring on (trigger) your migraine headaches. Follow these instructions at home:  Only take medicines as told by your doctor.  Lie down in a dark, quiet room when you have a migraine.  Keep a journal to find out if certain things bring on migraine headaches. For example, write down:  What you eat and drink.  How much sleep you get.  Any change to your diet or medicines.  Lessen how much alcohol you drink.  Quit smoking if you smoke.  Get enough sleep.  Lessen any stress in your life.  Keep lights dim if bright lights bother you or make your migraines worse. Contact a doctor if:  Medicine does not help your migraines.  Your pain keeps coming back.  You have a fever. Get help right away if:  Your migraine becomes really bad.  You have a stiff neck.  You have trouble seeing.  Your muscles are weak, or you lose muscle control.  You lose your balance or have trouble walking.  You feel like you will pass out (faint), or you pass out.  You have really bad  symptoms that are different than your first symptoms. This information is not intended to replace advice given to you by your health care provider. Make sure you discuss any questions you have with your health care provider. Document Released: 04/29/2008 Document Revised: 12/27/2015 Document Reviewed: 03/28/2013 Elsevier Interactive Patient Education  2017 [employer]

## 2016-08-15 NOTE — Progress Notes (Signed)
Subjective:  Patient ID: Monique Aguilar, female    DOB: 01-15-1966  Age: 51 y.o. MRN: [id number]  CC: Headache and Breast Mass   HPI Monique Aguilar presents for   1. Chronic migraines: for past 5-6 years. Excedrin migraine used to help. Goody powders do not help. Has headache every other day. In front of head and back of head. Last 2 hrs. Relieved with cold compress and rest. No associated nausea, blurry vision, fever. No ringing in ears or hearing loss. No focal weakness or numbness. Has slight headache now. She took Excedrin migraine on hour ago.   2. Breast mass: on left side. Noticed on week ago. Feels heavy. No rash. Has hx of smaller breast lumps that were benign. No fever or chills. No breast trauma. No nipple discharge.   3. ED f/u chest pain: seen in ED on 07/26/2016. Negative troponin. She endorsed urinary retention at the time as well. Lasix increased, output back to baseline although baseline is low. No dysuria or hematuria. Slight leg swelling.   Social History  Substance Use Topics  . Smoking status: Never Smoker  . Smokeless tobacco: Never Used  . Alcohol use No    Outpatient Medications Prior to Visit  Medication Sig Dispense Refill  . aspirin 81 MG chewable tablet Chew by mouth daily.    [name]. aspirin-acetaminophen-caffeine (EXCEDRIN MIGRAINE) 250-250-65 MG per tablet Take 2 tablets by mouth every 6 (six) hours as needed for migraine.     [name]. atenolol (TENORMIN) 25 MG tablet TAKE 1 TABLET BY MOUTH DAILY. 90 tablet 3  . furosemide (LASIX) 40 MG tablet Take 1 tablet (40 mg total) by mouth daily. 90 tablet 3  . furosemide (LASIX) 40 MG tablet TAKE 1 TABLET BY MOUTH DAILY. 30 tablet 6  . irbesartan (AVAPRO) 150 MG tablet Take 1 tablet (150 mg total) by mouth daily. 30 tablet 0  . potassium chloride (K-DUR) 10 MEQ tablet TAKE 1 TABLET (10 MEQ TOTAL) BY MOUTH DAILY. 90 tablet 2   No facility-administered medications prior to visit.     ROS Review of Systems  Constitutional:  Negative for chills and fever.  Eyes: Negative for visual disturbance.  Respiratory: Negative for shortness of breath.   Cardiovascular: Negative for chest pain.  Gastrointestinal: Negative for abdominal pain and blood in stool.  Musculoskeletal: Negative for arthralgias and back pain.  Skin: Negative for rash.  Allergic/Immunologic: Negative for immunocompromised state.  Neurological: Positive for headaches.  Hematological: Negative for adenopathy. Does not bruise/bleed easily.  Psychiatric/Behavioral: Negative for dysphoric mood and suicidal ideas.    Objective:  BP 133/81 (BP Location: Right Arm, Patient Position: Sitting, Cuff Size: Large)   Pulse (!) 58   Temp 98.3 F (36.8 C) (Oral)   Resp 16   Wt 231 lb (104.8 kg)   LMP 07/08/2016   SpO2 97%   BMI 37.28 kg/m   BP/Weight 07/26/2016 03/19/2016 08/09/2015  Systolic BP 122 126 132  Diastolic BP 81 86 78  Wt. (Lbs) 230.31 233.8 227  BMI 37.17 37.74 36.66    Physical Exam  Constitutional: She is oriented to person, place, and time. She appears well-developed and well-nourished. No distress.  HENT:  Head: Normocephalic and atraumatic.  Right Ear: Tympanic membrane and ear canal normal.  Left Ear: Tympanic membrane and external ear normal.  Nose: Nose normal.  Mouth/Throat: Oropharynx is clear and moist.  Eyes: Conjunctivae and EOM are normal. Pupils are equal, round, and reactive to light.  Cardiovascular: Normal  rate, regular rhythm, normal heart sounds and intact distal pulses.   Pulmonary/Chest: Effort normal and breath sounds normal. Right breast exhibits no inverted nipple, no mass, no nipple discharge, no skin change and no tenderness. Left breast exhibits mass and tenderness. Left breast exhibits no inverted nipple, no nipple discharge and no skin change.    Musculoskeletal: She exhibits no edema.  Lymphadenopathy:    She has no cervical adenopathy.    She has no axillary adenopathy.  Neurological: She is alert  and oriented to person, place, and time. She has normal reflexes. No cranial nerve deficit. She exhibits normal muscle tone. Coordination normal.  Skin: Skin is warm and dry. No rash noted.  Psychiatric: She has a normal mood and affect.     Chemistry      Component Value Date/Time   NA 135 07/26/2016 1137   K 3.9 07/26/2016 1137   CL 104 07/26/2016 1137   CO2 23 07/26/2016 1137   BUN 9 07/26/2016 1137   CREATININE 1.28 (H) 07/26/2016 1137   CREATININE 1.07 (H) 03/19/2016 1201      Component Value Date/Time   CALCIUM 9.6 07/26/2016 1137   ALKPHOS 65 04/16/2015 1637   AST 16 04/16/2015 1637   ALT 15 04/16/2015 1637   BILITOT 0.4 04/16/2015 1637      Assessment & Plan:  Monique Aguilar was seen today for headache and breast mass.  Diagnoses and all orders for this visit:  Renal insufficiency -     BASIC METABOLIC PANEL WITH GFR  Left breast mass -     MM DIAG BREAST TOMO BILATERAL; Future -     US BREAST LTD UNI LEFT INC AXILLA; Future  Migraine without aura and without status migrainosus, not intractable -     amitriptyline (ELAVIL) 25 MG tablet; Take by mouth 12.5 mg nightly for one week, then 25 mg nightly for one week, then 50 mg nightly -     SUMAtriptan (IMITREX) 25 MG tablet; Take 1 tablet (25 mg total) by mouth every 2 (two) hours as needed for migraine. May repeat in 2 hours if headache persists or recurs. Do not exceed 2 doses in one week.   There are no diagnoses linked to this encounter.  No orders of the defined types were placed in this encounter.   Follow-up: Return in about 4 weeks (around 09/12/2016) for migraine .   Dessa Phi MD

## 2016-08-21 ENCOUNTER — Other Ambulatory Visit: Payer: Self-pay

## 2016-08-27 ENCOUNTER — Telehealth: Payer: Self-pay

## 2016-08-27 NOTE — Telephone Encounter (Signed)
Pt was called and informed of lab results. 

## 2016-08-28 ENCOUNTER — Other Ambulatory Visit: Payer: Self-pay | Admitting: Cardiology

## 2016-08-28 ENCOUNTER — Other Ambulatory Visit: Payer: Self-pay | Admitting: Internal Medicine

## 2016-08-28 MED FILL — IRBESARTAN 150 MG TABLET: 150 | 30 days supply | Qty: 30 | Fill #0

## 2016-08-28 MED FILL — ATENOLOL 50 MG TABLET: 50 | 30 days supply | Qty: 15 | Fill #0

## 2016-08-28 NOTE — Telephone Encounter (Signed)
Rx refill sent to pharmacy. 

## 2016-08-29 ENCOUNTER — Other Ambulatory Visit: Payer: Self-pay | Admitting: Family Medicine

## 2016-08-29 ENCOUNTER — Ambulatory Visit
Admission: RE | Admit: 2016-08-29 | Discharge: 2016-08-29 | Disposition: A | Discharge status: Home / Self Care | Payer: BLUE CROSS/BLUE SHIELD | Source: Ambulatory Visit | Attending: Family Medicine | Admitting: Family Medicine

## 2016-08-29 DIAGNOSIS — R928 Other abnormal and inconclusive findings on diagnostic imaging of breast: Secondary | ICD-10-CM

## 2016-08-29 DIAGNOSIS — N6320 Unspecified lump in the left breast, unspecified quadrant: Secondary | ICD-10-CM

## 2016-09-01 ENCOUNTER — Encounter (HOSPITAL_COMMUNITY): Payer: Self-pay | Admitting: Emergency Medicine

## 2016-09-01 ENCOUNTER — Ambulatory Visit (HOSPITAL_COMMUNITY)
Admission: EM | Admit: 2016-09-01 | Discharge: 2016-09-01 | Disposition: A | Discharge status: Home / Self Care | Payer: BLUE CROSS/BLUE SHIELD | Attending: Emergency Medicine | Admitting: Emergency Medicine

## 2016-09-01 DIAGNOSIS — R6889 Other general symptoms and signs: Secondary | ICD-10-CM

## 2016-09-01 DIAGNOSIS — B349 Viral infection, unspecified: Secondary | ICD-10-CM

## 2016-09-01 NOTE — ED Triage Notes (Signed)
Pt c/o cold sx onset: 4 days  Sx include: dry/prod cough, CP due to dry cough, chills, BAs, fever last night of 101.0  Taking: OTC cold meds w/temp relief.   A&O x4... NAD

## 2016-09-01 NOTE — Discharge Instructions (Signed)
Sudafed PE 10 mg every 4 to 6 hours as needed for congestion Allegra or Zyrtec daily as needed for drainage and runny nose. For stronger antihistamine may take Chlor-Trimeton 2 to 4 mg every 4 to 6 hours, may cause drowsiness. Saline nasal spray used frequently. Ibuprofen 600 mg every 6 hours as needed for pain, discomfort or fever. May also use tylenol Drink plenty of fluids and stay well-hydrated.

## 2016-09-01 NOTE — ED Provider Notes (Signed)
CSN: [id number]     Arrival date & time 09/01/16  [id number] History   First MD Initiated Contact with Patient 09/01/16 1045     Chief Complaint  Patient presents with  . 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 Fairly gradual onset over the past 3 days. Last night she states she had a temperature 101.2 degrees. She has taken NyQuil with partial relief. She did not receive a flu shot this year. She is currently afebrile. No acute distress. Demonstrates an occasional dry cough respirations are even and nonlabored. Fully awake and alert and talkative.      Past Medical History:  Diagnosis Date  . Abnormal CT of the chest    a. 10/2013: 4mm RML pulm nodule, axillary lymphadenopathy. Instructed to f/u PCP.  [name]. Anomalous right coronary artery   . Atypical chest pain    a. 10/2013: suspected GI etiology. Nuc - nonischemic, EF 39% but visually higher - f/u echo to clarify showed EF 50-55% with only mild LVH, no significant valvuar disease.  . Chronic diastolic congestive heart failure (HCC)   . HTN (hypertension)   . Low back pain   . Microcytic anemia    a. No prior workup.  [name]. Migraines    Past Surgical History:  Procedure Laterality Date  . CESAREAN SECTION  1988  . TONSILLECTOMY  1992?  . TUBAL LIGATION  1995   Family History  Problem Relation Age of Onset  . Heart disease Father     Dx early 5740's (? heart valve)  . Heart attack Father 371  . Congestive Heart Failure Mother   . CAD Neg Hx    Social History  Substance Use Topics  . Smoking status: Never Smoker  . Smokeless tobacco: Never Used  . Alcohol use No   OB History    No data available     Review of Systems  Constitutional: Positive for chills and fever. Negative for activity change, appetite change and fatigue.  HENT: Positive for congestion and  rhinorrhea. Negative for facial swelling and postnasal drip.   Eyes: Negative.   Respiratory: Positive for cough. Negative for shortness of breath.   Cardiovascular: Negative.   Musculoskeletal: Positive for myalgias. Negative for neck pain and neck stiffness.  Skin: Negative for pallor and rash.  Neurological: Positive for headaches.  All other systems reviewed and are negative.   Allergies  Patient has no known allergies.  Home Medications   Prior to Admission medications   Medication Sig Start Date End Date Taking? Authorizing Provider  amitriptyline (ELAVIL) 25 MG tablet Take by mouth 12.5 mg nightly for one week, then 25 mg nightly for one week, then 50 mg nightly 08/15/16  Yes Josalyn Funches, MD  aspirin 81 MG chewable tablet Chew by mouth daily.   Yes Historical Provider, MD  aspirin-acetaminophen-caffeine (EXCEDRIN MIGRAINE) [phone] MG per tablet Take 2 tablets by mouth every 6 (six) hours as needed for migraine.    Yes Historical Provider, MD  atenolol (TENORMIN) 50 MG tablet TAKE 1/2 TABLET BY MOUTH DAILY 08/28/16  Yes Lars MassonKatarina H Nelson, MD  furosemide (LASIX) 40 MG tablet Take 1 tablet (40 mg total) by mouth daily. 03/21/16  Yes Janetta HoraKathryn R Thompson, PA-C  furosemide (LASIX) 40 MG tablet TAKE 1 TABLET BY MOUTH DAILY. 07/08/16  Yes Lars MassonKatarina H Nelson, MD  irbesartan (AVAPRO) 150 MG tablet TAKE 1 TABLET BY MOUTH DAILY.  08/28/16  Yes Josalyn Funches, MD  potassium chloride (K-DUR) 10 MEQ tablet TAKE 1 TABLET (10 MEQ TOTAL) BY MOUTH DAILY. 06/04/16  Yes Lars Masson, MD  SUMAtriptan (IMITREX) 25 MG tablet Take 1 tablet (25 mg total) by mouth every 2 (two) hours as needed for migraine. May repeat in 2 hours if headache persists or recurs. Do not exceed 2 doses in one week. 08/15/16  Yes Dessa Phi, MD   Meds Ordered and Administered this Visit  Medications - No data to display  BP 120/80 (BP Location: Left Arm)   Pulse 78   Temp 98.5 F (36.9 C) (Oral)   Resp 20   LMP  08/04/2016   SpO2 100%  No data found.   Physical Exam  Constitutional: She is oriented to person, place, and time. She appears well-developed and well-nourished. No distress.  HENT:  Right Ear: External ear normal.  Left Ear: External ear normal.  Oropharynx moist with minor erythema and clear PND. No exudates Bilateral TMs are normal.  Eyes: EOM are normal.  Neck: Normal range of motion. Neck supple.  Cardiovascular: Normal rate, regular rhythm, normal heart sounds and intact distal pulses.   Pulmonary/Chest: Effort normal and breath sounds normal. No respiratory distress. She has no wheezes. She has no rales.  Musculoskeletal: Normal range of motion. She exhibits no edema.  Lymphadenopathy:    She has no cervical adenopathy.  Neurological: She is alert and oriented to person, place, and time.  Skin: Skin is warm and dry.  Nursing note and vitals reviewed.   Urgent Care Course     Procedures (including critical care time)  Labs Review Labs Reviewed - No data to display  Imaging Review No results found.   Visual Acuity Review  Right Eye Distance:   Left Eye Distance:   Bilateral Distance:    Right Eye Near:   Left Eye Near:    Bilateral Near:         MDM   1. Flu-like symptoms   2. Viral syndrome    Sudafed PE 10 mg every 4 to 6 hours as needed for congestion Allegra or Zyrtec daily as needed for drainage and runny nose. For stronger antihistamine may take Chlor-Trimeton 2 to 4 mg every 4 to 6 hours, may cause drowsiness. Saline nasal spray used frequently. Ibuprofen 600 mg every 6 hours as needed for pain, discomfort or fever. May also use tylenol Drink plenty of fluids and stay well-hydrated.    Hayden Rasmussen, NP 09/01/16 1121

## 2016-09-05 ENCOUNTER — Encounter: Payer: Self-pay | Admitting: Physician Assistant

## 2016-09-05 ENCOUNTER — Ambulatory Visit: Payer: Self-pay

## 2016-09-05 ENCOUNTER — Ambulatory Visit (HOSPITAL_COMMUNITY)
Admission: RE | Admit: 2016-09-05 | Discharge: 2016-09-05 | Disposition: A | Discharge status: Home / Self Care | Payer: BLUE CROSS/BLUE SHIELD | Attending: *Deleted | Admitting: *Deleted

## 2016-09-05 ENCOUNTER — Ambulatory Visit (HOSPITAL_COMMUNITY)
Admission: RE | Admit: 2016-09-05 | Discharge: 2016-09-05 | Disposition: A | Discharge status: Home / Self Care | Payer: BLUE CROSS/BLUE SHIELD | Source: Ambulatory Visit | Attending: Physician Assistant | Admitting: Physician Assistant

## 2016-09-05 ENCOUNTER — Ambulatory Visit: Payer: BLUE CROSS/BLUE SHIELD | Attending: Physician Assistant | Admitting: Physician Assistant

## 2016-09-05 VITALS — BP 124/82 | HR 102 | Temp 98.5°F | Resp 20 | Wt 224.8 lb

## 2016-09-05 DIAGNOSIS — I110 Hypertensive heart disease with heart failure: Secondary | ICD-10-CM | POA: Insufficient documentation

## 2016-09-05 DIAGNOSIS — I5032 Chronic diastolic (congestive) heart failure: Secondary | ICD-10-CM | POA: Insufficient documentation

## 2016-09-05 DIAGNOSIS — J069 Acute upper respiratory infection, unspecified: Secondary | ICD-10-CM | POA: Insufficient documentation

## 2016-09-05 DIAGNOSIS — Z79899 Other long term (current) drug therapy: Secondary | ICD-10-CM | POA: Insufficient documentation

## 2016-09-05 DIAGNOSIS — R05 Cough: Secondary | ICD-10-CM | POA: Diagnosis not present

## 2016-09-05 DIAGNOSIS — Z7982 Long term (current) use of aspirin: Secondary | ICD-10-CM | POA: Insufficient documentation

## 2016-09-05 DIAGNOSIS — R059 Cough, unspecified: Secondary | ICD-10-CM

## 2016-09-05 MED ORDER — ACETAMINOPHEN 500 MG PO TABS: 500.0000 mg | ORAL_TABLET | Freq: Four times a day (QID) | ORAL | 0 refills | Status: DC | PRN

## 2016-09-05 MED ORDER — HYDROCOD POLST-CPM POLST ER 10-8 MG/5ML PO SUER: 5.0000 mL | Freq: Two times a day (BID) | ORAL | 0 refills | Status: DC

## 2016-09-05 MED ORDER — GUAIFENESIN ER 1200 MG PO TB12: 1.0000 | ORAL_TABLET | Freq: Two times a day (BID) | ORAL | 0 refills | Status: AC

## 2016-09-05 MED ORDER — GUAIFENESIN ER 1200 MG PO TB12: 1.0000 | ORAL_TABLET | Freq: Two times a day (BID) | ORAL | 0 refills | Status: DC

## 2016-09-05 MED ORDER — AMOXICILLIN-POT CLAVULANATE 875-125 MG PO TABS: 1.0000 | ORAL_TABLET | Freq: Two times a day (BID) | ORAL | 0 refills | Status: DC

## 2016-09-05 MED ORDER — HYDROCOD POLST-CPM POLST ER 10-8 MG/5ML PO SUER
5.0000 mL | Freq: Two times a day (BID) | ORAL | 0 refills | Status: AC
Start: 2016-09-05 — End: 2016-09-12

## 2016-09-05 MED ORDER — AMOXICILLIN-POT CLAVULANATE 875-125 MG PO TABS: 1.0000 | ORAL_TABLET | Freq: Two times a day (BID) | ORAL | 0 refills | Status: AC

## 2016-09-05 NOTE — Progress Notes (Signed)
Patient ID: Monique Aguilar, female   DOB: 04-May-1966, 51 y.o.   MRN: [id number]    Subjective:  Patient ID: Monique Aguilar, female    DOB: [birth year]  Age: 51 y.o. MRN: [id number]  CC: Cold/flu  HPI Monique Aguilar is a 51 y.o. female with a PMH of   Past Medical History:  Diagnosis Date  . Abnormal CT of the chest    a. 10/2013: 4mm RML pulm nodule, axillary lymphadenopathy. Instructed to f/u PCP.  [name] Anomalous right coronary artery   . Atypical chest pain    a. 10/2013: suspected GI etiology. Nuc - nonischemic, EF 39% but visually higher - f/u echo to clarify showed EF 50-55% with only mild LVH, no significant valvuar disease.  . Chronic diastolic congestive heart failure (HCC)   . HTN (hypertension)   . Low back pain   . Microcytic anemia    a. No prior workup.  . Migraines    that presents with a 7 day history of cough, malaise, fever, chills, body aches, and headache. 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 Has taken OTC meds with no relief. Denies chest pain, SOB, headache, abdominal pain, rash, or GI/GU sxs.  Outpatient Medications Prior to Visit  Medication Sig Dispense Refill  . amitriptyline (ELAVIL) 25 MG tablet Take by mouth 12.5 mg nightly for one week, then 25 mg nightly for one week, then 50 mg nightly 60 tablet 0  . aspirin 81 MG chewable tablet Chew by mouth daily.    [name] atenolol (TENORMIN) 50 MG tablet TAKE 1/2 TABLET BY MOUTH DAILY 120 tablet 0  . furosemide (LASIX) 40 MG tablet Take 1 tablet (40 mg total) by mouth daily. 90 tablet 3  . irbesartan (AVAPRO) 150 MG tablet TAKE 1 TABLET BY MOUTH DAILY. 30 tablet 3  . potassium chloride (K-DUR) 10 MEQ tablet TAKE 1 TABLET (10 MEQ TOTAL) BY MOUTH DAILY. 90 tablet 2  . SUMAtriptan (IMITREX) 25 MG tablet Take 1 tablet (25 mg total) by mouth every 2 (two) hours as needed for migraine. May repeat in 2 hours if headache persists or recurs. Do not exceed 2 doses in one  week. 10 tablet 0  . aspirin-acetaminophen-caffeine (EXCEDRIN MIGRAINE) 250-250-65 MG per tablet Take 2 tablets by mouth every 6 (six) hours as needed for migraine.     . furosemide (LASIX) 40 MG tablet TAKE 1 TABLET BY MOUTH DAILY. (Patient not taking: Reported on 09/05/2016) 30 tablet 6   No facility-administered medications prior to visit.     ROS Review of Systems  Constitutional: Positive for chills, fatigue and fever.  HENT: Negative for ear pain, sinus pain and sore throat.   Eyes: Negative for pain.  Respiratory: Positive for cough.   Cardiovascular: Negative for chest pain.  Gastrointestinal: Negative for abdominal pain, nausea and vomiting.  Genitourinary: Negative for difficulty urinating and dysuria.  Musculoskeletal: Positive for back pain (upper trapezius pain bilaterally). Negative for neck pain.  Skin: Negative for rash.  Neurological: Positive for headaches. Negative for dizziness.  Psychiatric/Behavioral: Negative for confusion.    Objective:  BP 124/82 (BP Location: Right Arm, Patient Position: Sitting, Cuff Size: Large)   Pulse (!) 102   Temp 98.5 F (36.9 C) (Oral)   Resp 20   Wt 224 lb 12.8 oz (102 kg)   SpO2 100%   BMI 36.28 kg/m   BP/Weight 09/05/2016 09/01/2016 08/15/2016  Systolic BP 124 120  133  Diastolic BP 82 80 81  Wt. (Lbs) 224.8 - 231  BMI 36.28 - 37.28      Physical Exam  Constitutional: She is oriented to person, place, and time.  Very frequent coughing, overweight, polite  HENT:  Head: Normocephalic and atraumatic.  Eyes: No scleral icterus.  Cardiovascular: Normal rate, regular rhythm and normal heart sounds.   Pulmonary/Chest: No respiratory distress. She has no wheezes. She has rales (faint crackles heard at lung base bilaterally).  Musculoskeletal: She exhibits no edema.  Lymphadenopathy:    She has cervical adenopathy.  Neurological: She is alert and oriented to person, place, and time.  Skin: Skin is warm and dry. No rash noted.   Psychiatric: She has a normal mood and affect. Her behavior is normal.     Assessment & Plan:   1. Cough  - DG Chest 2 View; Future - chlorpheniramine-HYDROcodone (TUSSIONEX PENNKINETIC ER) 10-8 MG/5ML SUER; Take 5 mLs by mouth 2 (two) times daily.  Dispense: 70 mL; Refill: 0 - amoxicillin-clavulanate (AUGMENTIN) 875-125 MG tablet; Take 1 tablet by mouth 2 (two) times daily.  Dispense: 20 tablet; Refill: 0 - acetaminophen (TYLENOL) 500 MG tablet; Take 1 tablet (500 mg total) by mouth every 6 (six) hours as needed.  Dispense: 30 tablet; Refill: 0 - Influenza A and B Ag, Immunoassay - Guaifenesin (MUCINEX MAXIMUM STRENGTH) 1200 MG TB12; Take 1 tablet (1,200 mg total) by mouth 2 (two) times daily.  Dispense: 10 tablet; Refill: 0  2. Upper respiratory tract infection, unspecified type  - DG Chest 2 View; Future. Ordered as precaution due to faint crackles - amoxicillin-clavulanate (AUGMENTIN) 875-125 MG tablet; Take 1 tablet by mouth 2 (two) times daily.  Dispense: 20 tablet; Refill: 0 - acetaminophen (TYLENOL) 500 MG tablet; Take 1 tablet (500 mg total) by mouth every 6 (six) hours as needed.  Dispense: 30 tablet; Refill: 0 - Influenza A and B Ag, Immunoassay - Guaifenesin (MUCINEX MAXIMUM STRENGTH) 1200 MG TB12; Take 1 tablet (1,200 mg total) by mouth 2 (two) times daily.  Dispense: 10 tablet; Refill: 0   Meds ordered this encounter  Medications  . chlorpheniramine-HYDROcodone (TUSSIONEX PENNKINETIC ER) 10-8 MG/5ML SUER    Sig: Take 5 mLs by mouth 2 (two) times daily.    Dispense:  70 mL    Refill:  0    Order Specific Question:   Supervising Provider    Answer:   Quentin Angst [id number]  . amoxicillin-clavulanate (AUGMENTIN) 875-125 MG tablet    Sig: Take 1 tablet by mouth 2 (two) times daily.    Dispense:  20 tablet    Refill:  0    Order Specific Question:   Supervising Provider    Answer:   Quentin Angst [id number]  . acetaminophen (TYLENOL) 500 MG tablet     Sig: Take 1 tablet (500 mg total) by mouth every 6 (six) hours as needed.    Dispense:  30 tablet    Refill:  0    Order Specific Question:   Supervising Provider    Answer:   Quentin Angst [id number]  . Guaifenesin (MUCINEX MAXIMUM STRENGTH) 1200 MG TB12    Sig: Take 1 tablet (1,200 mg total) by mouth 2 (two) times daily.    Dispense:  10 tablet    Refill:  0    Order Specific Question:   Supervising Provider    Answer:   Quentin Angst [id number]    Follow-up: Return in about 7 days (around  09/12/2016) for f/u Migraines with Dr. Armen PickupFunches.   Loletta Specteroger David Gomez PA

## 2016-09-05 NOTE — Patient Instructions (Signed)
I have ordered a chest x-ray. Please proceed to [facility]. Results should be available in 24-48 hours. I will call you if results are positive. Please take medications as directed. Go to emergency room if symptoms worsen or persist.  Cough, Adult Coughing is a reflex that clears your throat and your airways. Coughing helps to heal and protect your lungs. It is normal to cough occasionally, but a cough that happens with other symptoms or lasts a long time may be a sign of a condition that needs treatment. A cough may last only 2-3 weeks (acute), or it may last longer than 8 weeks (chronic). What are the causes? Coughing is commonly caused by:  Breathing in substances that irritate your lungs.  A viral or bacterial respiratory infection.  Allergies.  Asthma.  Postnasal drip.  Smoking.  Acid backing up from the stomach into the esophagus (gastroesophageal reflux).  Certain medicines.  Chronic lung problems, including COPD (or rarely, lung cancer).  Other medical conditions such as heart failure. Follow these instructions at home: Pay attention to any changes in your symptoms. Take these actions to help with your discomfort:  Take medicines only as told by your health care provider.  If you were prescribed an antibiotic medicine, take it as told by your health care provider. Do not stop taking the antibiotic even if you start to feel better.  Talk with your health care provider before you take a cough suppressant medicine.  Drink enough fluid to keep your urine clear or pale yellow.  If the air is dry, use a cold steam vaporizer or humidifier in your bedroom or your home to help loosen secretions.  Avoid anything that causes you to cough at work or at home.  If your cough is worse at night, try sleeping in a semi-upright position.  Avoid cigarette smoke. If you smoke, quit smoking. If you need help quitting, ask your health care provider.  Avoid  caffeine.  Avoid alcohol.  Rest as needed. Contact a health care provider if:  You have new symptoms.  You cough up pus.  Your cough does not get better after 2-3 weeks, or your cough gets worse.  You cannot control your cough with suppressant medicines and you are losing sleep.  You develop pain that is getting worse or pain that is not controlled with pain medicines.  You have a fever.  You have unexplained weight loss.  You have night sweats. Get help right away if:  You cough up blood.  You have difficulty breathing.  Your heartbeat is very fast. This information is not intended to replace advice given to you by your health care provider. Make sure you discuss any questions you have with your health care provider. Document Released: 01/17/2011 Document Revised: 12/27/2015 Document Reviewed: 09/27/2014 Elsevier Interactive Patient Education  2017 [employer]

## 2016-09-05 NOTE — Progress Notes (Signed)
Was [facility].  Take OTC nasal spray, Nyquil, Sudafed. Has no releif Presents with dry cough, nasal drainage, body aches, and feels cold all the time.

## 2016-09-10 ENCOUNTER — Telehealth: Payer: Self-pay

## 2016-09-10 NOTE — Progress Notes (Signed)
I have given this information to TongaVanessa from [facility]. She said she will call patient and see if patient still wants/needs influenza testing.

## 2016-09-10 NOTE — Telephone Encounter (Signed)
Patient missed lab work at recent appointment. If patient is feeling well, patient does not need lab work done. If patient is not feeling well patient needs to schedule lab appointment.

## 2016-10-10 ENCOUNTER — Encounter: Payer: Self-pay | Admitting: Family Medicine

## 2016-10-10 ENCOUNTER — Ambulatory Visit: Payer: BLUE CROSS/BLUE SHIELD | Attending: Family Medicine | Admitting: Family Medicine

## 2016-10-10 VITALS — BP 132/83 | HR 64 | Temp 98.2°F | Ht 66.0 in | Wt 231.2 lb

## 2016-10-10 DIAGNOSIS — Z79899 Other long term (current) drug therapy: Secondary | ICD-10-CM | POA: Diagnosis not present

## 2016-10-10 DIAGNOSIS — Z7982 Long term (current) use of aspirin: Secondary | ICD-10-CM | POA: Insufficient documentation

## 2016-10-10 DIAGNOSIS — J0110 Acute frontal sinusitis, unspecified: Secondary | ICD-10-CM | POA: Diagnosis not present

## 2016-10-10 DIAGNOSIS — R05 Cough: Secondary | ICD-10-CM | POA: Diagnosis present

## 2016-10-10 MED ORDER — BENZONATATE 100 MG PO CAPS: 100.0000 mg | ORAL_CAPSULE | Freq: Two times a day (BID) | ORAL | 0 refills | Status: DC | PRN

## 2016-10-10 MED ORDER — AMOXICILLIN 500 MG PO CAPS: 500.0000 mg | ORAL_CAPSULE | Freq: Three times a day (TID) | ORAL | 0 refills | Status: DC

## 2016-10-10 MED FILL — BENZONATATE 100 MG CAPSULE: 100 | 10 days supply | Qty: 20 | Fill #0

## 2016-10-10 MED FILL — AMOXICILLIN 500 MG CAPSULE: 500 | 10 days supply | Qty: 30 | Fill #0

## 2016-10-10 NOTE — Patient Instructions (Addendum)
[name] was seen today for cough.  Diagnoses and all orders for this visit:  Acute non-recurrent frontal sinusitis -     benzonatate (TESSALON) 100 MG capsule; Take 1 capsule (100 mg total) by mouth 2 (two) times daily as needed for cough. -     amoxicillin (AMOXIL) 500 MG capsule; Take 1 capsule (500 mg total) by mouth 3 (three) times daily.   For this please do the following:  1. Drink plenty of fluids. Hot tea, soup etc will help open your nasal passages. 2. Dextromethorphan 30 mg every 6-8 hrs (plain Robitussin) for cough suppression 3. Tylenol for pain up to 1000 mg three times daily.  4. Nasal saline-OTC nose spray for congestion.   F/u for chest pain, shortness of breath, persistent high fever.  F/u in  4 weeks if needed for cough   Dr. Armen PickupFunches

## 2016-10-10 NOTE — Assessment & Plan Note (Signed)
Cough x 1 month Amoxicillin ordered for sinusitis Tessalon perles for cough F/u in 4 weeks if needed

## 2016-10-10 NOTE — Progress Notes (Signed)
Subjective:  Patient ID: Monique Aguilar, female    DOB: Jan 24, 1966  Age: 51 y.o. MRN: [id number]  CC: Cough   HPI Monique LintCathy A Weisel presents for    1. Cough: x 1 month. Cough is dry at times. At times productive of mucus. She has congestion, headache, poor sleep and fatigue. She reports fever of 102 F 2 days ago relieved with ibuprofen. She reports urinary incontinence with cough. She denies CP and shortness of breath. She had  CXR on 09/05/2016 that was negative. She is a chronic non-smoker.   Social History  Substance Use Topics  . Smoking status: Never Smoker  . Smokeless tobacco: Never Used  . Alcohol use No    Outpatient Medications Prior to Visit  Medication Sig Dispense Refill  . acetaminophen (TYLENOL) 500 MG tablet Take 1 tablet (500 mg total) by mouth every 6 (six) hours as needed. 30 tablet 0  . amitriptyline (ELAVIL) 25 MG tablet Take by mouth 12.5 mg nightly for one week, then 25 mg nightly for one week, then 50 mg nightly 60 tablet 0  . aspirin 81 MG chewable tablet Chew by mouth daily.    [name]. aspirin-acetaminophen-caffeine (EXCEDRIN MIGRAINE) 250-250-65 MG per tablet Take 2 tablets by mouth every 6 (six) hours as needed for migraine.     [name]. atenolol (TENORMIN) 50 MG tablet TAKE 1/2 TABLET BY MOUTH DAILY 120 tablet 0  . furosemide (LASIX) 40 MG tablet Take 1 tablet (40 mg total) by mouth daily. 90 tablet 3  . furosemide (LASIX) 40 MG tablet TAKE 1 TABLET BY MOUTH DAILY. (Patient not taking: Reported on 09/05/2016) 30 tablet 6  . irbesartan (AVAPRO) 150 MG tablet TAKE 1 TABLET BY MOUTH DAILY. 30 tablet 3  . potassium chloride (K-DUR) 10 MEQ tablet TAKE 1 TABLET (10 MEQ TOTAL) BY MOUTH DAILY. 90 tablet 2  . SUMAtriptan (IMITREX) 25 MG tablet Take 1 tablet (25 mg total) by mouth every 2 (two) hours as needed for migraine. May repeat in 2 hours if headache persists or recurs. Do not exceed 2 doses in one week. 10 tablet 0   No facility-administered medications prior to visit.      ROS Review of Systems  Constitutional: Negative for chills and fever.  HENT: Positive for congestion, sinus pain and sinus pressure. Negative for sore throat and trouble swallowing.   Eyes: Negative for visual disturbance.  Respiratory: Positive for cough. Negative for shortness of breath.   Cardiovascular: Negative for chest pain.  Gastrointestinal: Negative for abdominal pain and blood in stool.  Musculoskeletal: Negative for arthralgias and back pain.  Skin: Negative for rash.  Allergic/Immunologic: Negative for immunocompromised state.  Hematological: Negative for adenopathy. Does not bruise/bleed easily.  Psychiatric/Behavioral: Positive for sleep disturbance. Negative for dysphoric mood and suicidal ideas.    Objective:  BP 132/83 (BP Location: Left Arm, Patient Position: Sitting, Cuff Size: Large)   Pulse 64   Temp 98.2 F (36.8 C) (Oral)   Ht 5\' 6"  (1.676 m)   Wt 231 lb 3.2 oz (104.9 kg)   LMP 09/28/2016   SpO2 100%   BMI 37.32 kg/m   BP/Weight 10/10/2016 09/05/2016 09/01/2016  Systolic BP 132 124 120  Diastolic BP 83 82 80  Wt. (Lbs) 231.2 224.8 -  BMI 37.32 36.28 -   Physical Exam  Constitutional: She is oriented to person, place, and time. She appears well-developed and well-nourished. No distress.  HENT:  Head: Normocephalic and atraumatic.  Right Ear: Tympanic membrane, external  ear and ear canal normal.  Left Ear: Tympanic membrane, external ear and ear canal normal.  Nose: Right sinus exhibits maxillary sinus tenderness and frontal sinus tenderness. Left sinus exhibits maxillary sinus tenderness and frontal sinus tenderness.  Mouth/Throat: Oropharynx is clear and moist and mucous membranes are normal.  Eyes: Conjunctivae are normal. Pupils are equal, round, and reactive to light.  Cardiovascular: Normal rate, regular rhythm, normal heart sounds and intact distal pulses.   Pulmonary/Chest: Effort normal and breath sounds normal.  Musculoskeletal: She  exhibits no edema.  Lymphadenopathy:    She has no cervical adenopathy.  Neurological: She is alert and oriented to person, place, and time.  Skin: Skin is warm and dry. No rash noted.  Psychiatric: She has a normal mood and affect.    Assessment & Plan:  Kryssa was seen today for cough.  Diagnoses and all orders for this visit:  Acute non-recurrent frontal sinusitis -     benzonatate (TESSALON) 100 MG capsule; Take 1 capsule (100 mg total) by mouth 2 (two) times daily as needed for cough. -     amoxicillin (AMOXIL) 500 MG capsule; Take 1 capsule (500 mg total) by mouth 3 (three) times daily.   There are no diagnoses linked to this encounter.  No orders of the defined types were placed in this encounter.   Follow-up: No Follow-up on file.   Dessa Phi MD

## 2016-10-10 NOTE — Progress Notes (Signed)
Pt is here today for a cough. Pt would like to get referral for colonoscopy.

## 2016-10-30 ENCOUNTER — Ambulatory Visit: Payer: Self-pay

## 2016-10-30 ENCOUNTER — Ambulatory Visit: Payer: BLUE CROSS/BLUE SHIELD | Attending: Family Medicine | Admitting: Physician Assistant

## 2016-10-30 VITALS — BP 148/88 | HR 113 | Temp 98.9°F | Resp 16 | Wt 229.6 lb

## 2016-10-30 DIAGNOSIS — H938X3 Other specified disorders of ear, bilateral: Secondary | ICD-10-CM | POA: Diagnosis not present

## 2016-10-30 DIAGNOSIS — Z79899 Other long term (current) drug therapy: Secondary | ICD-10-CM | POA: Insufficient documentation

## 2016-10-30 DIAGNOSIS — I110 Hypertensive heart disease with heart failure: Secondary | ICD-10-CM | POA: Insufficient documentation

## 2016-10-30 DIAGNOSIS — J069 Acute upper respiratory infection, unspecified: Secondary | ICD-10-CM | POA: Insufficient documentation

## 2016-10-30 DIAGNOSIS — I5032 Chronic diastolic (congestive) heart failure: Secondary | ICD-10-CM | POA: Diagnosis not present

## 2016-10-30 DIAGNOSIS — I10 Essential (primary) hypertension: Secondary | ICD-10-CM

## 2016-10-30 DIAGNOSIS — Z7982 Long term (current) use of aspirin: Secondary | ICD-10-CM | POA: Insufficient documentation

## 2016-10-30 DIAGNOSIS — H6983 Other specified disorders of Eustachian tube, bilateral: Secondary | ICD-10-CM

## 2016-10-30 MED ORDER — AZITHROMYCIN 250 MG PO TABS: ORAL_TABLET | ORAL | 0 refills | Status: DC

## 2016-10-30 MED ORDER — FLUCONAZOLE 150 MG PO TABS: 150.0000 mg | ORAL_TABLET | Freq: Once | ORAL | 0 refills | Status: AC

## 2016-10-30 MED ORDER — FLUTICASONE PROPIONATE 50 MCG/ACT NA SUSP: 2.0000 | Freq: Every day | NASAL | 6 refills | Status: DC

## 2016-10-30 MED ORDER — METHYLPREDNISOLONE SODIUM SUCC 125 MG IJ SOLR
125.0000 mg | Freq: Once | INTRAMUSCULAR | Status: AC
  Administered 2016-10-30: 125 mg via INTRAMUSCULAR

## 2016-10-30 MED FILL — AZITHROMYCIN 250 MG TABLET: 250 | 5 days supply | Qty: 6 | Fill #0

## 2016-10-30 MED FILL — FLUCONAZOLE 150 MG TABLET: 150 | 1 days supply | Qty: 1 | Fill #0

## 2016-10-30 MED FILL — FLUTICASONE PROP 50 MCG SPR: 50 | 30 days supply | Qty: 16 | Fill #0

## 2016-10-30 NOTE — Progress Notes (Signed)
Monique SimpsonCathy Koehler, is a 51 y.o. female  ZOX:[id number]  [id number]  DOB - 10-05-1965  Subjective:  Chief Complaint and HPI: Monique Aguilar is a 51 y.o. female here today for 2 month h/o cough and severe sinus congestion.  Treated with Augmentin and cough meds on 09/05/2016 then treated with amoxicillin and tessalon perles on 10/10/2016.  She had a negative CXR on 09/05/2016(reviewed). She continues to have a cough-now productive of yellow-green mucus.  Doesn't feel like she has ever improved after any of these treatments.  She denies any heartburn type symptoms.  No f/c.   ROS:   Constitutional:  No f/c, No night sweats, No unexplained weight loss. EENT:  No vision changes, No blurry vision, No hearing changes. +scratchy throat, ear congestion, sinus pressure and pain Respiratory: + cough, No SOB Cardiac: No CP, no palpitations GI:  No abd pain, No N/V/D. GU: No Urinary s/sx Musculoskeletal: No joint pain Neuro: No headache, no dizziness, no motor weakness.  Skin: No rash Endocrine:  No polydipsia. No polyuria.  Psych: Denies SI/HI  No problems updated.  ALLERGIES: No Known Allergies  PAST MEDICAL HISTORY: Past Medical History:  Diagnosis Date  . Abnormal CT of the chest    a. 10/2013: 4mm RML pulm nodule, axillary lymphadenopathy. Instructed to f/u PCP.  [name]. Anomalous right coronary artery   . Atypical chest pain    a. 10/2013: suspected GI etiology. Nuc - nonischemic, EF 39% but visually higher - f/u echo to clarify showed EF 50-55% with only mild LVH, no significant valvuar disease.  . Chronic diastolic congestive heart failure (HCC)   . HTN (hypertension)   . Low back pain   . Microcytic anemia    a. No prior workup.  28. Migraines     MEDICATIONS AT HOME: Prior to Admission medications   Medication Sig Start Date End Date Taking? Authorizing Provider  acetaminophen (TYLENOL) 500 MG tablet Take 1 tablet (500 mg total) by mouth every 6 (six) hours as needed. 09/05/16   Loletta Specteroger  David Gomez, PA-C  amitriptyline (ELAVIL) 25 MG tablet Take by mouth 12.5 mg nightly for one week, then 25 mg nightly for one week, then 50 mg nightly 08/15/16   Dessa PhiJosalyn Funches, MD  amoxicillin (AMOXIL) 500 MG capsule Take 1 capsule (500 mg total) by mouth 3 (three) times daily. Patient not taking: Reported on 10/30/2016 10/10/16   Dessa PhiJosalyn Funches, MD  aspirin 81 MG chewable tablet Chew by mouth daily.    Historical Provider, MD  aspirin-acetaminophen-caffeine (EXCEDRIN MIGRAINE) [phone] MG per tablet Take 2 tablets by mouth every 6 (six) hours as needed for migraine.     Historical Provider, MD  atenolol (TENORMIN) 50 MG tablet TAKE 1/2 TABLET BY MOUTH DAILY 08/28/16   Lars MassonKatarina H Nelson, MD  azithromycin [facility](ZITHROMAX) 250 MG tablet Take 2 tabs today then 1 daily 10/30/16   Anders SimmondsAngela M McClung, PA-C  benzonatate (TESSALON) 100 MG capsule Take 1 capsule (100 mg total) by mouth 2 (two) times daily as needed for cough. 10/10/16   Josalyn Funches, MD  fluconazole (DIFLUCAN) 150 MG tablet Take 1 tablet (150 mg total) by mouth once. 10/30/16 10/30/16  Anders SimmondsAngela M McClung, PA-C  fluticasone (FLONASE) 50 MCG/ACT nasal spray Place 2 sprays into both nostrils daily. 10/30/16   Anders SimmondsAngela M McClung, PA-C  furosemide (LASIX) 40 MG tablet Take 1 tablet (40 mg total) by mouth daily. 03/21/16   Janetta HoraKathryn R Thompson, PA-C  irbesartan (AVAPRO) 150 MG tablet TAKE 1 TABLET BY MOUTH  DAILY. 08/28/16   Josalyn Funches, MD  potassium chloride (K-DUR) 10 MEQ tablet TAKE 1 TABLET (10 MEQ TOTAL) BY MOUTH DAILY. 06/04/16   Lars Masson, MD  SUMAtriptan (IMITREX) 25 MG tablet Take 1 tablet (25 mg total) by mouth every 2 (two) hours as needed for migraine. May repeat in 2 hours if headache persists or recurs. Do not exceed 2 doses in one week. 08/15/16   Josalyn Funches, MD     Objective:  EXAM:   Vitals:   10/30/16 1439  BP: (!) 148/88  Pulse: (!) 113  Resp: 16  Temp: 98.9 F (37.2 C)  TempSrc: Oral  SpO2: 96%  Weight: 229 lb 9.6 oz  (104.1 kg)    General appearance : A&OX3. NAD. Non-toxic-appearing HEENT: Atraumatic and Normocephalic.  PERRLA. EOM intact.  TM bulging with fluid B.  Turbinates swollen B.  Sounds and appears congested. Mouth-MMM, post pharynx WNL w/o erythema, No PND. Neck: supple, no JVD. No cervical lymphadenopathy. No thyromegaly Chest/Lungs:  Breathing-non-labored, Good air entry bilaterally, breath sounds normal without rales, rhonchi, or wheezing  CVS: S1 S2 regular, no murmurs, gallops, rubs  Extremities: Bilateral Lower Ext shows no edema, both legs are warm to touch with = pulse throughout Neurology:  CN II-XII grossly intact, Non focal.   Psych:  TP linear. J/I WNL. Normal speech. Appropriate eye contact and affect.  Skin:  No Rash  Data Review Lab Results  Component Value Date   HGBA1C 5.2 12/16/2012     Assessment & Plan   1. Upper respiratory infection, acute She hasn't responded to amoxicillin or augmentin.  Will cover for atypicals - azithromycin (ZITHROMAX) 250 MG tablet; Take 2 tabs today then 1 daily  Dispense: 6 tablet; Refill: 0  2. Dysfunction of both eustachian tubes Her congestion is fairly severe.  She is not diabetic. - methylPREDNISolone sodium succinate (SOLU-MEDROL) 125 mg/2 mL injection 125 mg; Inject 2 mLs (125 mg total) into the muscle once. - fluticasone (FLONASE) 50 MCG/ACT nasal spray; Place 2 sprays into both nostrils daily.  Dispense: 16 g; Refill: 6  3. Essential hypertension Not controlled today. Check BP out of office.  No changes to current regimen today  Patient have been counseled extensively about nutrition and exercise  Return if symptoms worsen or fail to improve.  The patient was given clear instructions to go to ER or return to medical center if symptoms don't improve, worsen or new problems develop. The patient verbalized understanding. The patient was told to call to get lab results if they haven't heard anything in the next week.      Georgian Co, PA-C [facility] [place], [place] [phone]   10/30/2016, 3:01 PMPatient ID: Nyoka Lint, female   DOB: 1966/01/26, 51 y.o.   MRN: [id number]

## 2016-11-10 ENCOUNTER — Telehealth: Payer: Self-pay

## 2016-11-10 NOTE — Telephone Encounter (Signed)
Pt verified that she would like to have colonoscopy scheduled.   Will call with scheduled appointment within the next week or two

## 2016-11-20 ENCOUNTER — Telehealth: Payer: Self-pay | Admitting: Family Medicine

## 2016-11-20 DIAGNOSIS — R053 Chronic cough: Secondary | ICD-10-CM

## 2016-11-20 DIAGNOSIS — R05 Cough: Secondary | ICD-10-CM

## 2016-11-20 NOTE — Telephone Encounter (Signed)
Pt calling stating that she has had a persisitent cough that won't go away and when she saw Georgian Co she was told that if the cough didn't go away to ask for a referral for the Ears, Nose, and Throat doctor. Pt is calling to request that referral. Requests a call to let her know if referral will be placed. Thank you.

## 2016-11-20 NOTE — Telephone Encounter (Signed)
Referral placed, please inform patient

## 2016-11-26 NOTE — Telephone Encounter (Signed)
Pt was called and informed of referral being placed. ?

## 2016-12-17 ENCOUNTER — Encounter: Payer: Self-pay | Admitting: Family Medicine

## 2016-12-24 MED FILL — ATENOLOL 50 MG TABLET: 50 | 30 days supply | Qty: 15 | Fill #1

## 2017-01-01 IMAGING — CR DG FOREARM 2V*R*
2 series · 2 of 2 positions shown · non-contrast
Comparison: Wrist radiographs from 04/30/2015

CLINICAL DATA: Right forearm and wrist injury with pain. Injury
occurred today.

EXAM:
RIGHT FOREARM - 2 VIEW

[view not recorded (1 of 2)]
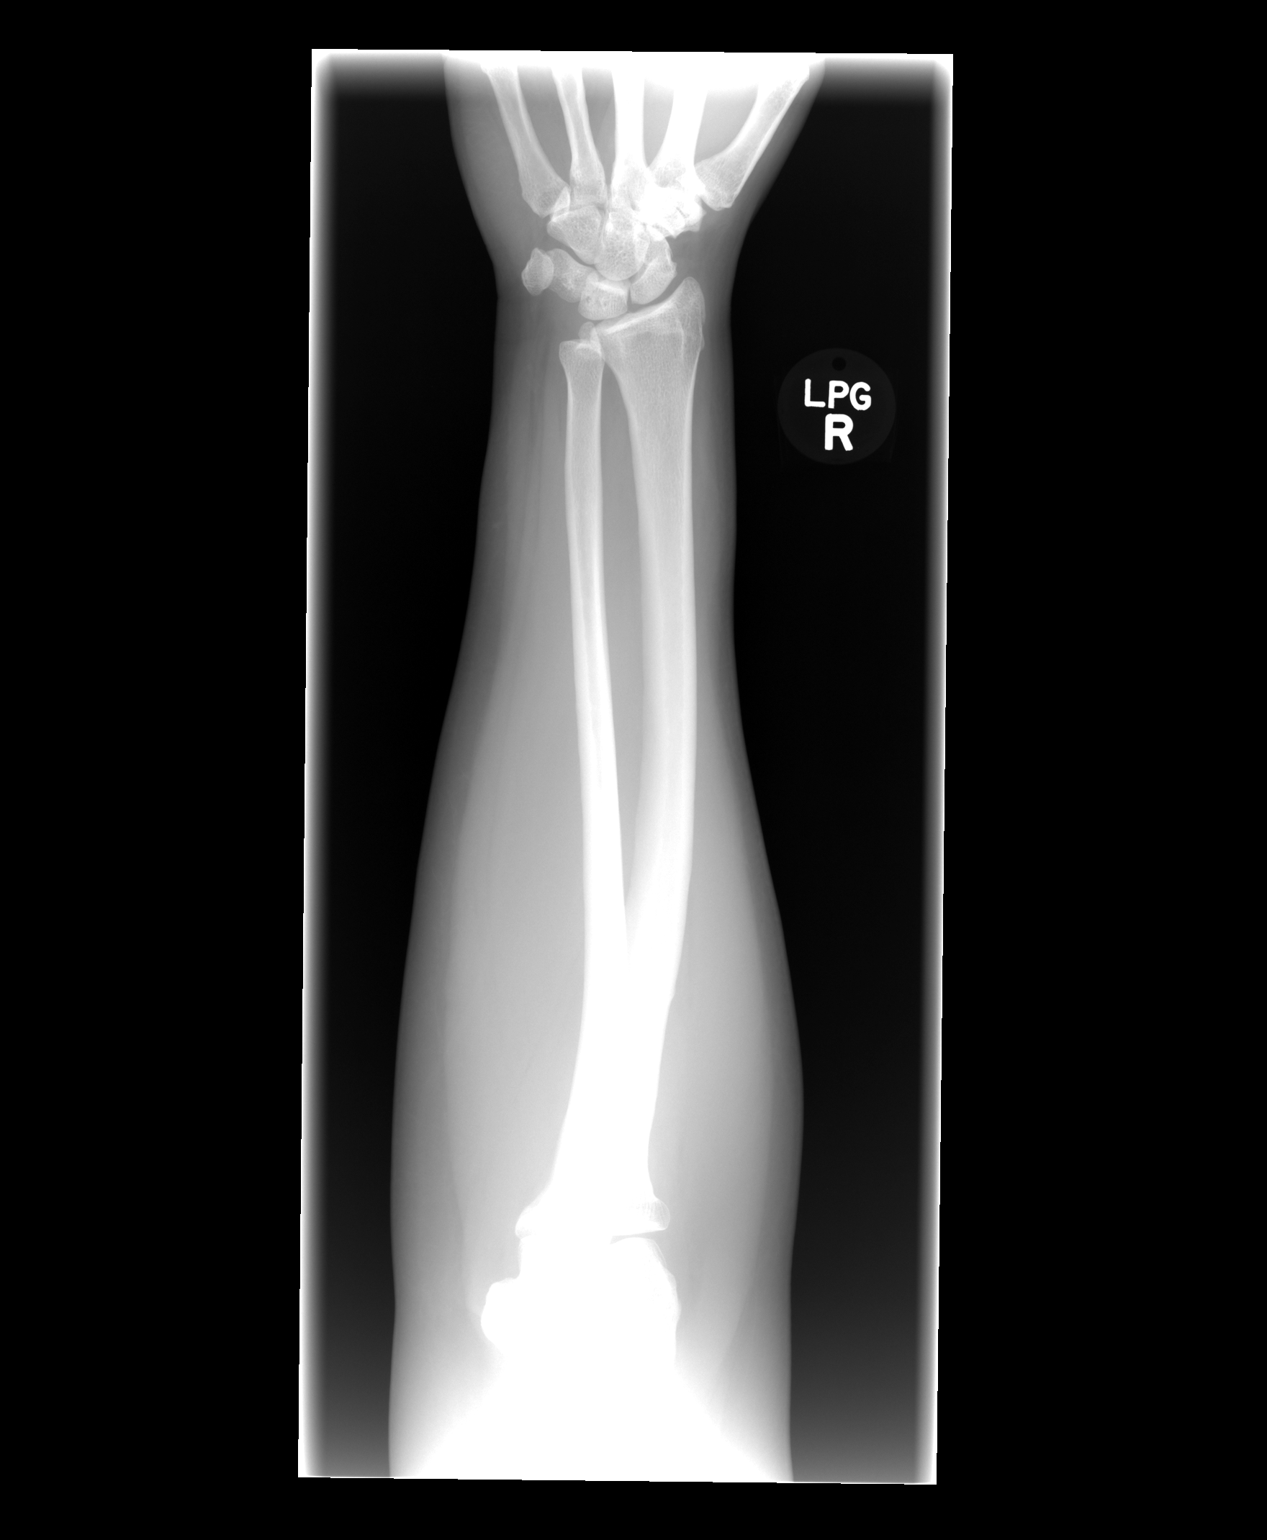

[view not recorded (2 of 2)]
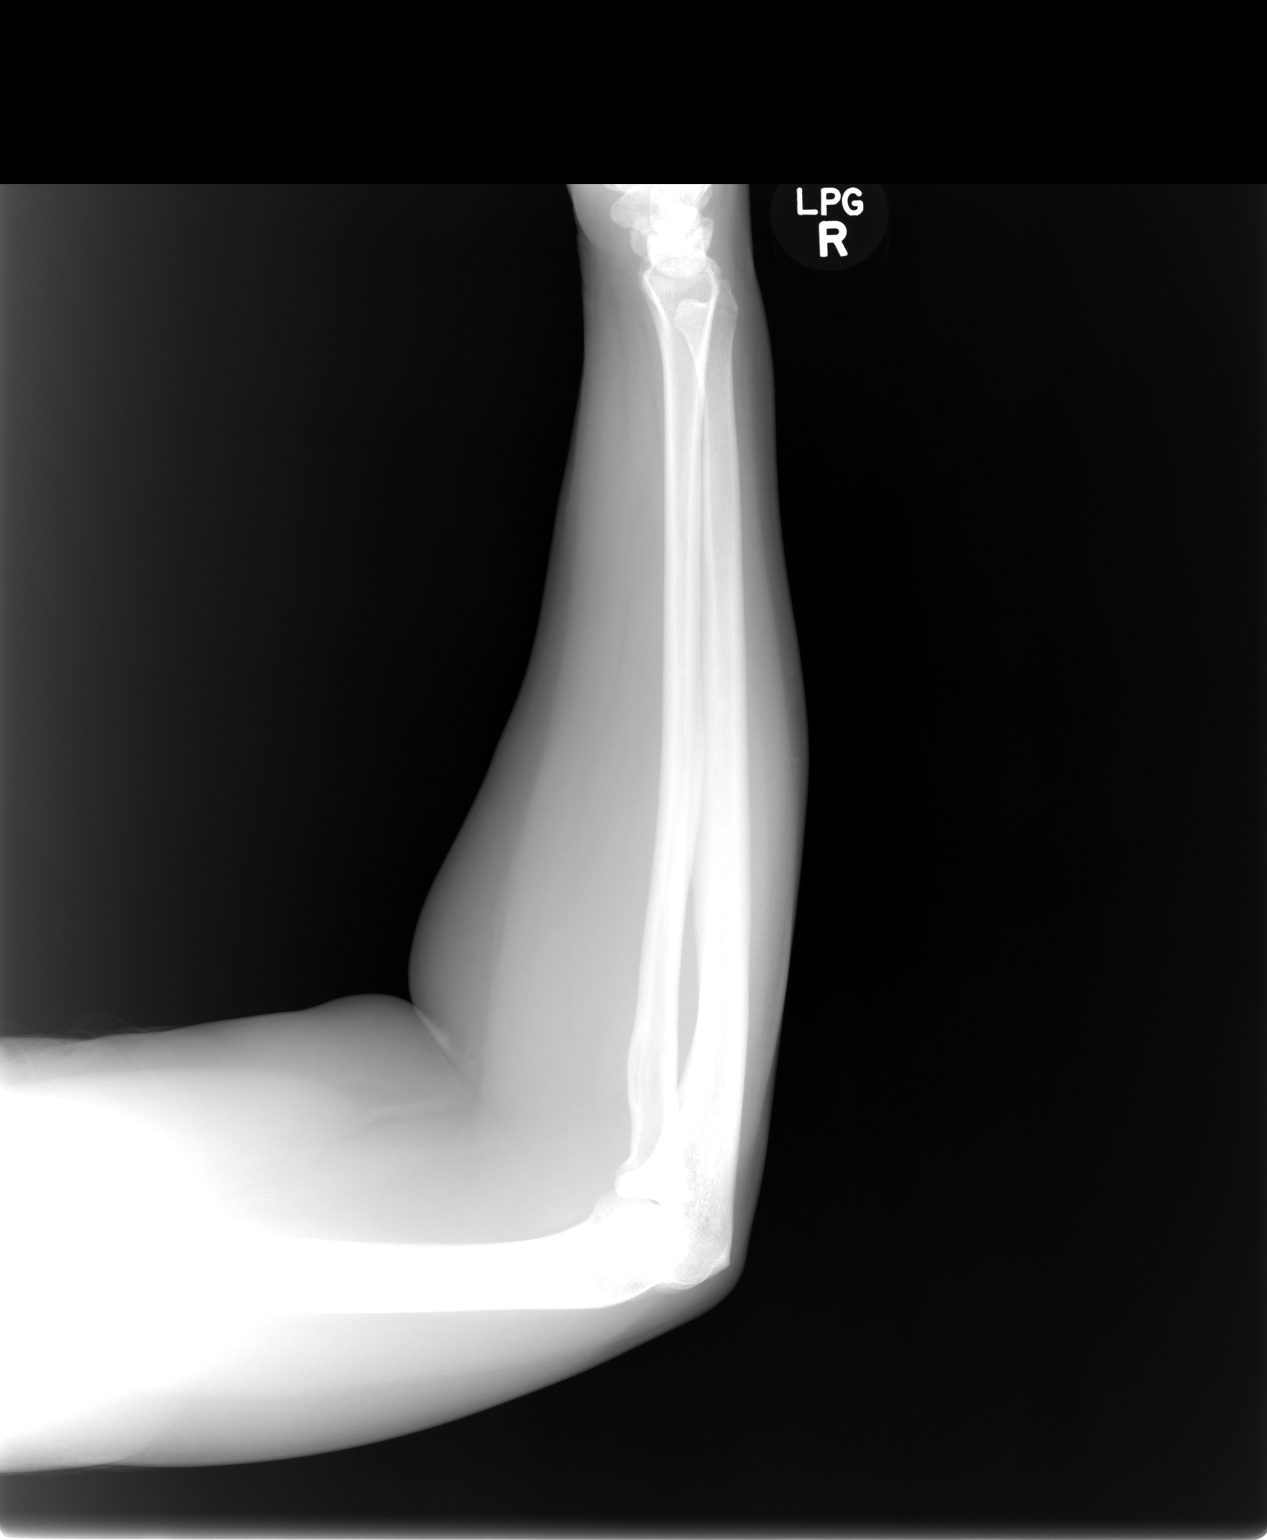

[2 of 2 positions shown; findings below may reference images not displayed]

FINDINGS: The radius and ulna appear intact. No definite elbow joint effusion.
No foreign body observed.
IMPRESSION: 1.  No significant abnormality identified.

## 2017-01-01 IMAGING — CR DG WRIST COMPLETE 3+V*R*
4 series · 4 of 4 positions shown · non-contrast
Comparison: None.

CLINICAL DATA: Acute right wrist pain after injury today. Initial
encounter.

EXAM:
RIGHT WRIST - COMPLETE 3+ VIEW

[view not recorded (1 of 4)]
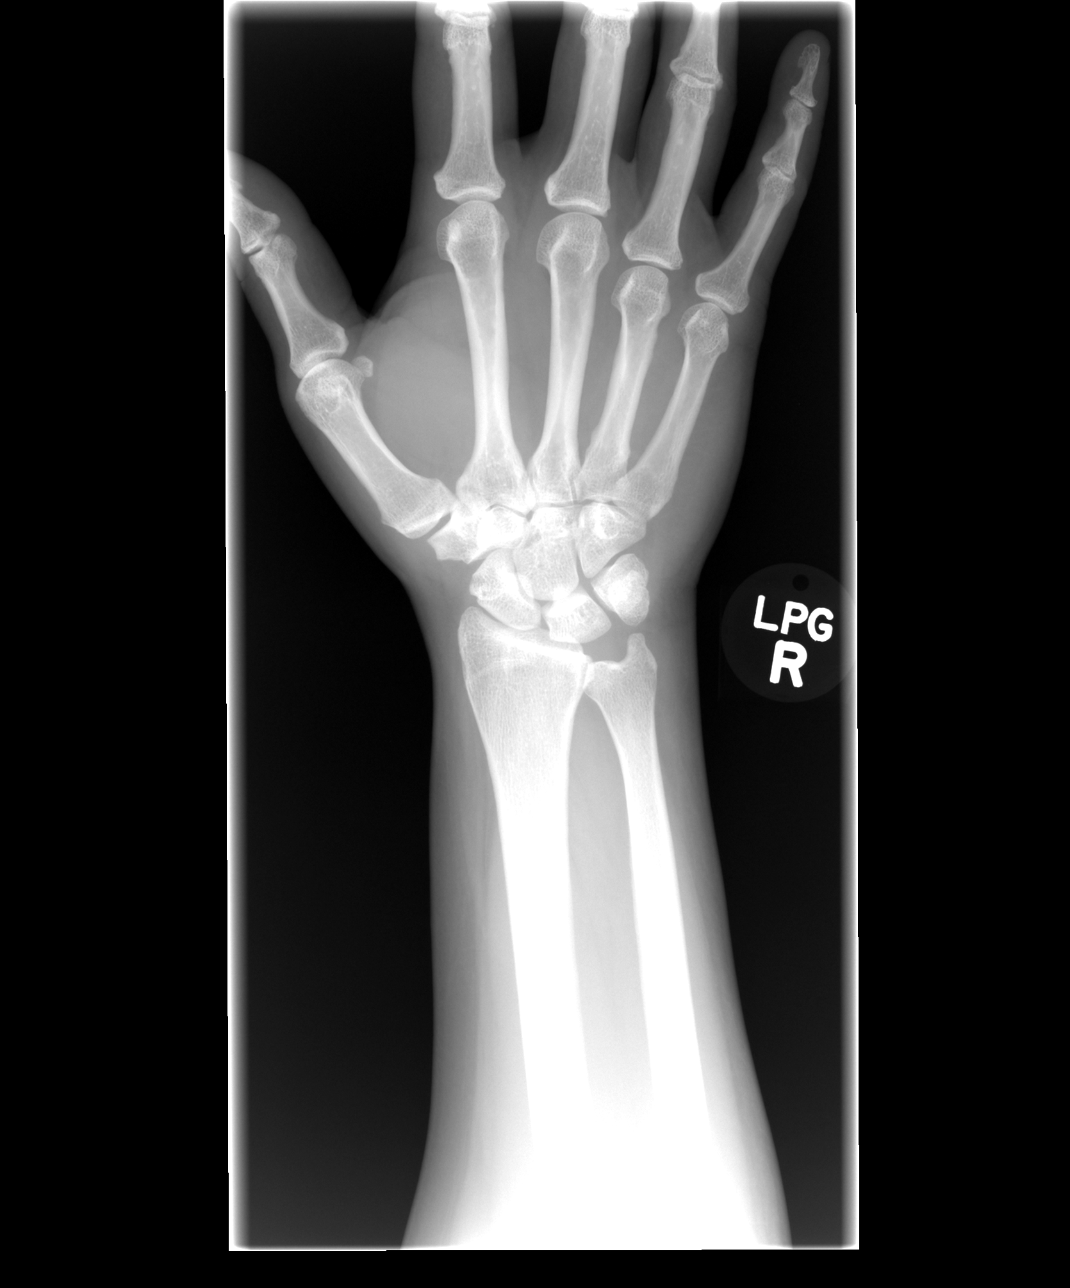

[view not recorded (2 of 4)]
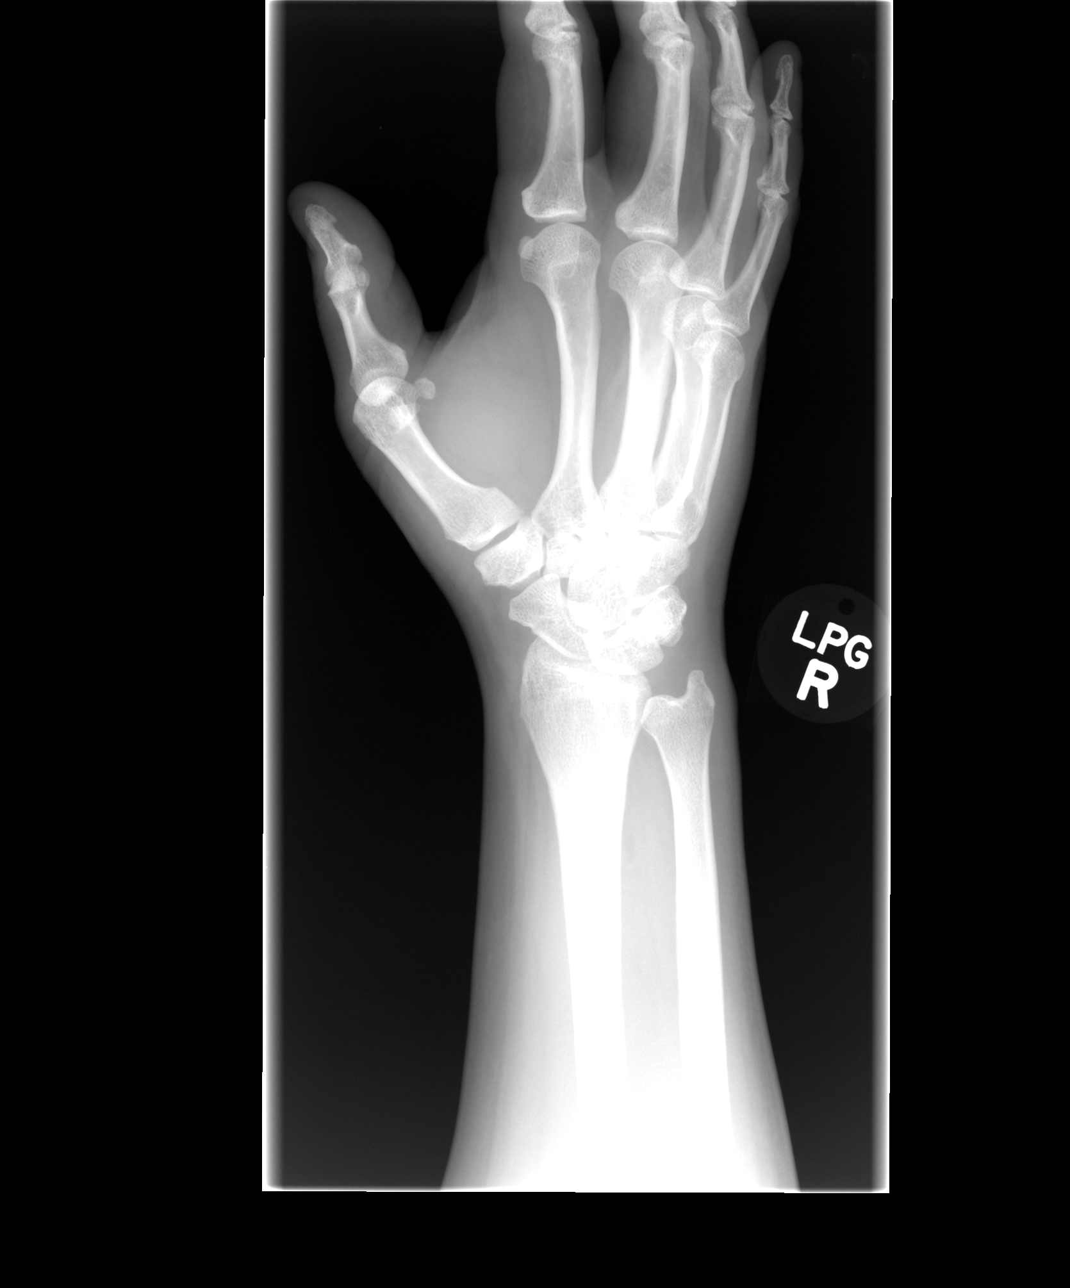

[view not recorded (3 of 4)]
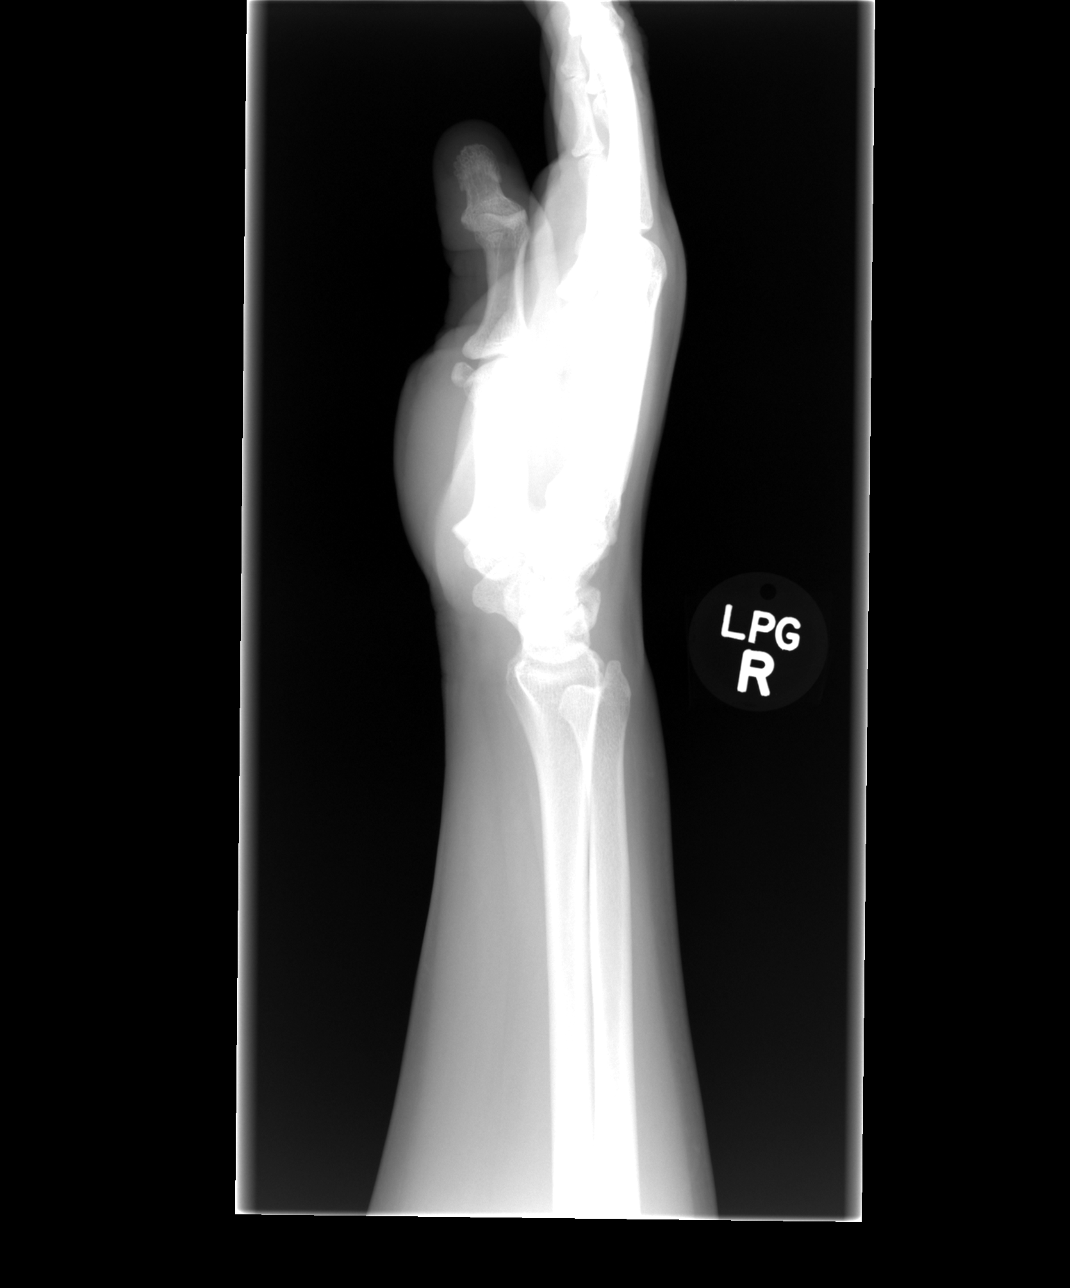

[view not recorded (4 of 4)]
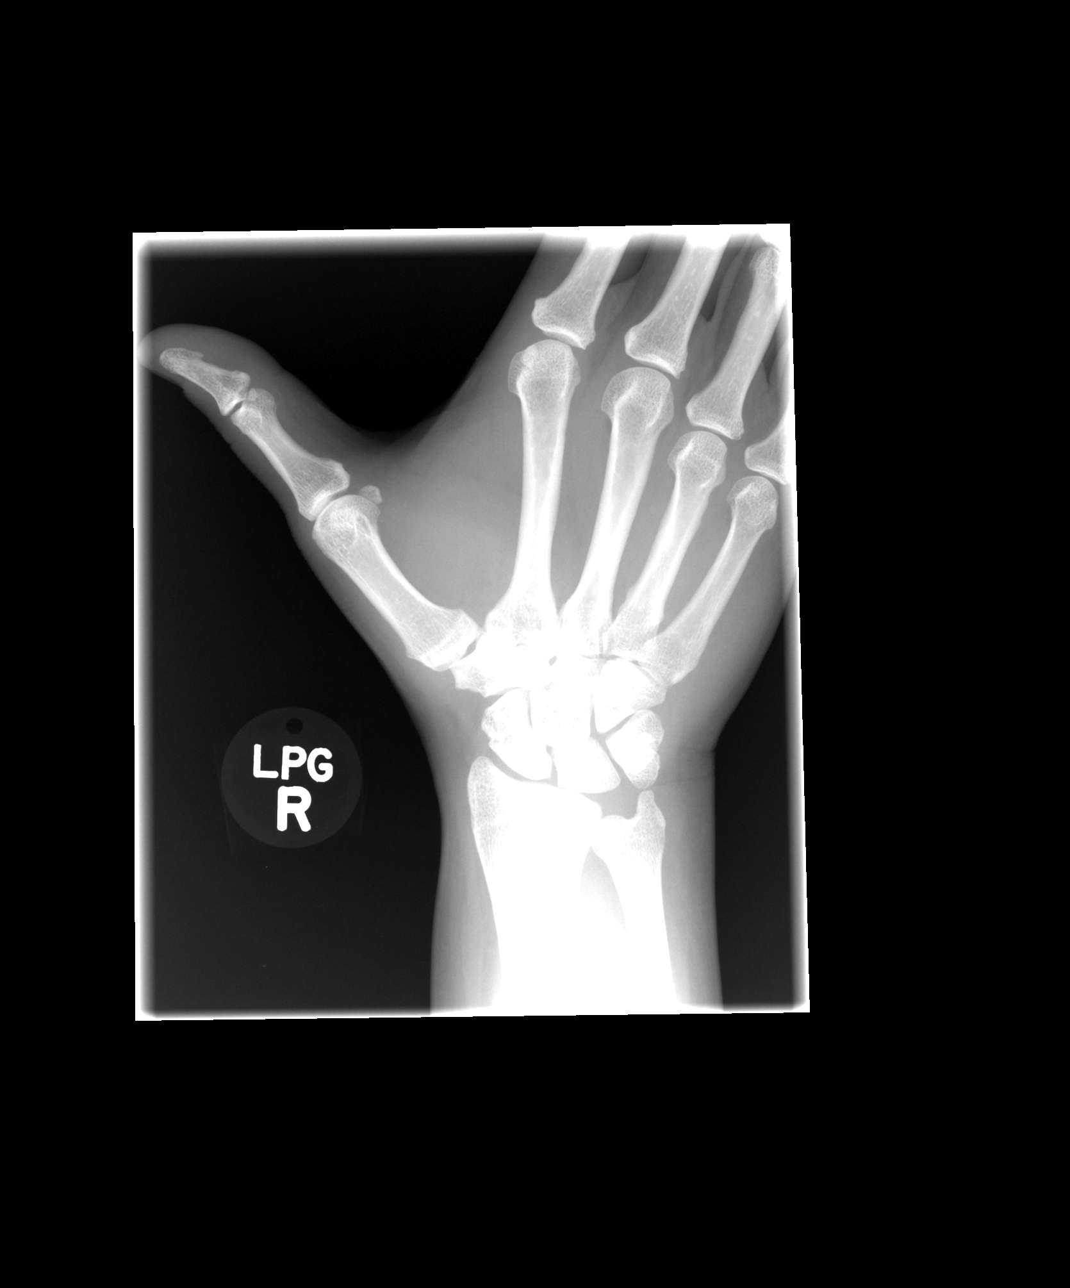

[4 of 4 positions shown; findings below may reference images not displayed]

FINDINGS: There is no evidence of fracture or dislocation. There is no
evidence of arthropathy or other focal bone abnormality. Soft
tissues are unremarkable.
IMPRESSION: Normal right wrist.

## 2017-01-02 MED FILL — IRBESARTAN 150 MG TABLET: 150 | 30 days supply | Qty: 30 | Fill #1

## 2017-02-02 ENCOUNTER — Telehealth: Payer: Self-pay

## 2017-02-02 NOTE — Telephone Encounter (Signed)
Contacted pt to schedule a pap patient didn't answer lvm informing pt to give us a call back to schedule per carmen can put paps in any 15 min slot

## 2017-03-14 ENCOUNTER — Ambulatory Visit (HOSPITAL_COMMUNITY)
Admission: EM | Admit: 2017-03-14 | Discharge: 2017-03-14 | Disposition: A | Discharge status: Home / Self Care | Payer: BLUE CROSS/BLUE SHIELD | Attending: Family Medicine | Admitting: Family Medicine

## 2017-03-14 ENCOUNTER — Encounter (HOSPITAL_COMMUNITY): Payer: Self-pay | Admitting: Family Medicine

## 2017-03-14 DIAGNOSIS — K0510 Chronic gingivitis, plaque induced: Secondary | ICD-10-CM | POA: Diagnosis not present

## 2017-03-14 DIAGNOSIS — K029 Dental caries, unspecified: Secondary | ICD-10-CM | POA: Diagnosis not present

## 2017-03-14 DIAGNOSIS — J0110 Acute frontal sinusitis, unspecified: Secondary | ICD-10-CM

## 2017-03-14 DIAGNOSIS — J069 Acute upper respiratory infection, unspecified: Secondary | ICD-10-CM

## 2017-03-14 MED ORDER — AMOXICILLIN 500 MG PO CAPS: 500.0000 mg | ORAL_CAPSULE | Freq: Three times a day (TID) | ORAL | 0 refills | Status: DC

## 2017-03-14 NOTE — ED Triage Notes (Signed)
Pt c/o facial pressure and pain, nasal drainage and productive cough of greenish phlegm onset yesterday. Has taken Sudafed, Nyquil and ibuprofen for pain. Denies fever.

## 2017-03-14 NOTE — ED Provider Notes (Signed)
[facility]    CSN: [id number] Arrival date & time: 03/14/17  1525     History   Chief Complaint Chief Complaint  Patient presents with  . URI    HPI Monique Aguilar is a 51 y.o. female.   This is a delightful 51 year old woman who comes in with cough and congestion. She had a fever for 103 last night. She has chronically infected teeth. She's had no nausea. Ibuprofen has not helped her.  Patient works as a Conservator, museum/gallery      Past Medical History:  Diagnosis Date  . Abnormal CT of the chest    a. 10/2013: 4mm RML pulm nodule, axillary lymphadenopathy. Instructed to f/u PCP.  [name] Anomalous right coronary artery   . Atypical chest pain    a. 10/2013: suspected GI etiology. Nuc - nonischemic, EF 39% but visually higher - f/u echo to clarify showed EF 50-55% with only mild LVH, no significant valvuar disease.  . Chronic diastolic congestive heart failure (HCC)   . HTN (hypertension)   . Low back pain   . Microcytic anemia    a. No prior workup.  . Migraines     Patient Active Problem List   Diagnosis Date Noted  . Acute non-recurrent frontal sinusitis 10/10/2016  . Left breast mass 08/15/2016  . Migraine without aura and without status migrainosus, not intractable 08/15/2016  . Exertional shortness of breath 04/24/2015  . Anomalous right coronary artery   . Chronic diastolic congestive heart failure (HCC)   . Hyperlipidemia 05/31/2014  . Family history of premature coronary artery disease 05/31/2014  . Morbid obesity (HCC) 05/31/2014  . HTN (hypertension) 10/11/2013  . Atypical chest pain 12/17/2012  . Microcytic anemia 12/16/2012  . Renal insufficiency 12/16/2012    Past Surgical History:  Procedure Laterality Date  . CESAREAN SECTION  1988  . TONSILLECTOMY  1992?  . TUBAL LIGATION  1995    OB History    No data available       Home Medications    Prior to Admission medications   Medication Sig Start Date End Date Taking?  Authorizing Provider  acetaminophen (TYLENOL) 500 MG tablet Take 1 tablet (500 mg total) by mouth every 6 (six) hours as needed. 09/05/16   Loletta Specter, PA-C  amitriptyline (ELAVIL) 25 MG tablet Take by mouth 12.5 mg nightly for one week, then 25 mg nightly for one week, then 50 mg nightly 08/15/16   Funches, Gerilyn Nestle, MD  amoxicillin (AMOXIL) 500 MG capsule Take 1 capsule (500 mg total) by mouth 3 (three) times daily. 03/14/17   Elvina Sidle, MD  aspirin 81 MG chewable tablet Chew by mouth daily.    [provider]  aspirin-acetaminophen-caffeine (EXCEDRIN MIGRAINE) [phone] MG per tablet Take 2 tablets by mouth every 6 (six) hours as needed for migraine.     [provider]  atenolol (TENORMIN) 50 MG tablet TAKE 1/2 TABLET BY MOUTH DAILY 08/28/16   Lars Masson, MD  azithromycin [facility]) 250 MG tablet Take 2 tabs today then 1 daily 10/30/16   Anders Simmonds, PA-C  benzonatate (TESSALON) 100 MG capsule Take 1 capsule (100 mg total) by mouth 2 (two) times daily as needed for cough. 10/10/16   Funches, Gerilyn Nestle, MD  fluticasone (FLONASE) 50 MCG/ACT nasal spray Place 2 sprays into both nostrils daily. 10/30/16   Anders Simmonds, PA-C  furosemide (LASIX) 40 MG tablet Take 1 tablet (40 mg total) by mouth daily. 03/21/16  Janetta Hora, PA-C  irbesartan (AVAPRO) 150 MG tablet TAKE 1 TABLET BY MOUTH DAILY. 08/28/16   Funches, Gerilyn Nestle, MD  potassium chloride (K-DUR) 10 MEQ tablet TAKE 1 TABLET (10 MEQ TOTAL) BY MOUTH DAILY. 06/04/16   Lars Masson, MD  SUMAtriptan (IMITREX) 25 MG tablet Take 1 tablet (25 mg total) by mouth every 2 (two) hours as needed for migraine. May repeat in 2 hours if headache persists or recurs. Do not exceed 2 doses in one week. 08/15/16   Dessa Phi, MD    Family History Family History  Problem Relation Age of Onset  . Heart disease Father        Dx early 23's (? heart valve)  . Heart attack Father 46  . Congestive Heart  Failure Mother   . CAD Neg Hx     Social History Social History  Substance Use Topics  . Smoking status: Never Smoker  . Smokeless tobacco: Never Used  . Alcohol use No     Allergies   Patient has no known allergies.   Review of Systems Review of Systems  Constitutional: Positive for chills and fever.  HENT: Positive for congestion, dental problem and ear pain.   Respiratory: Positive for cough.   Gastrointestinal: Negative.   Musculoskeletal: Negative.   Neurological: Negative.      Physical Exam Triage Vital Signs ED Triage Vitals  Enc Vitals Group     BP      Pulse      Resp      Temp      Temp src      SpO2      Weight      Height      Head Circumference      Peak Flow      Pain Score      Pain Loc      Pain Edu?      Excl. in GC?    No data found.   Updated Vital Signs There were no vitals taken for this visit.   Physical Exam  Constitutional: She is oriented to person, place, and time. She appears well-developed and well-nourished.  HENT:  Right Ear: External ear normal.  Left Ear: External ear normal.  Multiple dental caries with swollen gingiva on the right upper surfaces  Eyes: Conjunctivae and EOM are normal.  Neck: Normal range of motion. Neck supple.  Cardiovascular: Normal rate, regular rhythm and normal heart sounds.   Pulmonary/Chest: Effort normal and breath sounds normal.  Musculoskeletal: Normal range of motion.  Neurological: She is alert and oriented to person, place, and time.  Skin: Skin is warm and dry.  Nursing note and vitals reviewed.    UC Treatments / Results  Labs (all labs ordered are listed, but only abnormal results are displayed) Labs Reviewed - No data to display  EKG  EKG Interpretation None       Radiology No results found.  Procedures Procedures (including critical care time)  Medications Ordered in UC Medications - No data to display   Initial Impression / Assessment and Plan / UC Course   I have reviewed the triage vital signs and the nursing notes.  Pertinent labs & imaging results that were available during my care of the patient were reviewed by me and considered in my medical decision making (see chart for details).     Final Clinical Impressions(s) / UC Diagnoses   Final diagnoses:  Acute upper respiratory infection  Dental caries  Gingivitis    New Prescriptions Current Discharge Medication List       Controlled Substance Prescriptions [place] Controlled Substance Registry consulted? Not Applicable   Elvina SidleLauenstein, Keayra Graham, MD 03/14/17 [phone]

## 2017-06-16 MED FILL — IRBESARTAN 150 MG TABLET: 150 | 30 days supply | Qty: 30 | Fill #2

## 2017-06-16 MED FILL — ATENOLOL 50 MG TABLET: 50 | 30 days supply | Qty: 15 | Fill #2

## 2017-09-02 ENCOUNTER — Ambulatory Visit (HOSPITAL_COMMUNITY)
Admission: EM | Admit: 2017-09-02 | Discharge: 2017-09-02 | Disposition: A | Discharge status: Home / Self Care | Payer: BLUE CROSS/BLUE SHIELD | Attending: Family Medicine | Admitting: Family Medicine

## 2017-09-02 ENCOUNTER — Encounter (HOSPITAL_COMMUNITY): Payer: Self-pay | Admitting: Emergency Medicine

## 2017-09-02 ENCOUNTER — Other Ambulatory Visit: Payer: Self-pay

## 2017-09-02 DIAGNOSIS — J111 Influenza due to unidentified influenza virus with other respiratory manifestations: Secondary | ICD-10-CM

## 2017-09-02 DIAGNOSIS — R69 Illness, unspecified: Secondary | ICD-10-CM | POA: Diagnosis not present

## 2017-09-02 MED ORDER — HYDROCOD POLST-CPM POLST ER 10-8 MG/5ML PO SUER: 5.0000 mL | Freq: Two times a day (BID) | ORAL | 0 refills | Status: DC | PRN

## 2017-09-02 MED ORDER — OSELTAMIVIR PHOSPHATE 75 MG PO CAPS: 75.0000 mg | ORAL_CAPSULE | Freq: Two times a day (BID) | ORAL | 0 refills | Status: DC

## 2017-09-02 NOTE — ED Provider Notes (Signed)
[facility]   [id number] 09/02/17 Arrival Time: 1300   SUBJECTIVE:  Monique Aguilar is a 52 y.o. female who presents to the urgent care with complaint of tickle in throat on Monday, neck pain, pressure in center of face, cough are symptoms that quickly evolved since onset.  She works at [employer] where they have an outbreak of the flu.    Past Medical History:  Diagnosis Date  . Abnormal CT of the chest    a. 10/2013: 4mm RML pulm nodule, axillary lymphadenopathy. Instructed to f/u PCP.  [name] Anomalous right coronary artery   . Atypical chest pain    a. 10/2013: suspected GI etiology. Nuc - nonischemic, EF 39% but visually higher - f/u echo to clarify showed EF 50-55% with only mild LVH, no significant valvuar disease.  . Chronic diastolic congestive heart failure (HCC)   . HTN (hypertension)   . Low back pain   . Microcytic anemia    a. No prior workup.  . Migraines    Family History  Problem Relation Age of Onset  . Heart disease Father        Dx early 13's (? heart valve)  . Heart attack Father 46  . Congestive Heart Failure Mother   . CAD Neg Hx    Social History   Socioeconomic History  . Marital status: Single    Spouse name: Not on file  . Number of children: Not on file  . Years of education: Not on file  . Highest education level: Not on file  Social Needs  . Financial resource strain: Not on file  . Food insecurity - worry: Not on file  . Food insecurity - inability: Not on file  . Transportation needs - medical: Not on file  . Transportation needs - non-medical: Not on file  Occupational History  . Not on file  Tobacco Use  . Smoking status: Never Smoker  . Smokeless tobacco: Never Used  Substance and Sexual Activity  . Alcohol use: No  . Drug use: No  . Sexual activity: Not Currently    Birth control/protection: Surgical  Other Topics Concern  . Not on file  Social History Narrative  . Not on file   Current Meds    Medication Sig  . ibuprofen (ADVIL,MOTRIN) 200 MG tablet Take 200 mg by mouth every 6 (six) hours as needed.  . [DISCONTINUED] Pseudoeph-Doxylamine-DM-APAP (NYQUIL PO) Take by mouth.   No Known Allergies    ROS: As per HPI, remainder of ROS negative.   OBJECTIVE:   Vitals:   09/02/17 1404  BP: 134/87  Pulse: 85  Resp: (!) 22  Temp: 98.7 F (37.1 C)  TempSrc: Oral  SpO2: 100%     General appearance: alert; no distress Eyes: PERRL; EOMI; conjunctiva normal HENT: normocephalic; atraumatic; TMs normal, canal normal, external ears normal without trauma; nasal mucosa normal; oral mucosa normal Neck: supple Lungs: clear to auscultation bilaterally Heart: regular rate and rhythm Back: no CVA tenderness Extremities: no cyanosis or edema; symmetrical with no gross deformities Skin: warm and dry Neurologic: normal gait; grossly normal Psychological: alert and cooperative; normal mood and affect      Labs:  Results for orders placed or performed in visit on 08/15/16  BASIC METABOLIC PANEL WITH GFR  Result Value Ref Range   Sodium 135 135 - 146 mmol/L   Potassium 4.1 3.5 - 5.3 mmol/L   Chloride 103 98 - 110 mmol/L   CO2 26 20 -  31 mmol/L   Glucose, Bld 79 65 - 99 mg/dL   BUN 16 7 - 25 mg/dL   Creat 1.611.05 0.960.50 - 0.451.05 mg/dL   Calcium 9.1 8.6 - 40.910.4 mg/dL   GFR, Est African American 71 >=60 mL/min   GFR, Est Non African American 62 >=60 mL/min    Labs Reviewed - No data to display  No results found.     ASSESSMENT & PLAN:  1. Influenza-like illness     Meds ordered this encounter  Medications  . chlorpheniramine-HYDROcodone (TUSSIONEX PENNKINETIC ER) 10-8 MG/5ML SUER    Sig: Take 5 mLs by mouth every 12 (twelve) hours as needed for cough.    Dispense:  100 mL    Refill:  0  . oseltamivir (TAMIFLU) 75 MG capsule    Sig: Take 1 capsule (75 mg total) by mouth every 12 (twelve) hours.    Dispense:  10 capsule    Refill:  0    Reviewed expectations re:  course of current medical issues. Questions answered. Outlined signs and symptoms indicating need for more acute intervention. Patient verbalized understanding. After Visit Summary given.    Procedures:      Elvina SidleLauenstein, Omarri Eich, MD 09/02/17 1423

## 2017-09-02 NOTE — ED Triage Notes (Signed)
Onset of symptoms on Monday.  Tickle in throat on Monday, neck pain, pressure in center of face, cough are symptoms that quickly evolved since onset

## 2018-02-11 ENCOUNTER — Encounter (HOSPITAL_COMMUNITY): Payer: Self-pay | Admitting: Emergency Medicine

## 2018-02-11 ENCOUNTER — Ambulatory Visit (HOSPITAL_COMMUNITY)
Admission: EM | Admit: 2018-02-11 | Discharge: 2018-02-11 | Disposition: A | Discharge status: Home / Self Care | Payer: BLUE CROSS/BLUE SHIELD | Attending: Family Medicine | Admitting: Family Medicine

## 2018-02-11 DIAGNOSIS — K529 Noninfective gastroenteritis and colitis, unspecified: Secondary | ICD-10-CM | POA: Diagnosis not present

## 2018-02-11 MED ORDER — ONDANSETRON 4 MG PO TBDP
ORAL_TABLET | ORAL | Status: AC
  Filled 2018-02-11: qty 1

## 2018-02-11 MED ORDER — KETOROLAC TROMETHAMINE 60 MG/2ML IM SOLN
INTRAMUSCULAR | Status: AC
  Filled 2018-02-11: qty 2

## 2018-02-11 MED ORDER — ACETAMINOPHEN 500 MG PO TABS: 500.0000 mg | ORAL_TABLET | Freq: Four times a day (QID) | ORAL | 0 refills | Status: DC | PRN

## 2018-02-11 MED ORDER — KETOROLAC TROMETHAMINE 60 MG/2ML IM SOLN
60.0000 mg | Freq: Once | INTRAMUSCULAR | Status: AC
  Administered 2018-02-11: 60 mg via INTRAMUSCULAR

## 2018-02-11 MED ORDER — ONDANSETRON 4 MG PO TBDP
4.0000 mg | ORAL_TABLET | Freq: Once | ORAL | Status: AC
  Administered 2018-02-11: 4 mg via ORAL

## 2018-02-11 MED ORDER — ONDANSETRON 4 MG PO TBDP: 4.0000 mg | ORAL_TABLET | Freq: Three times a day (TID) | ORAL | 0 refills | Status: DC | PRN

## 2018-02-11 NOTE — ED Triage Notes (Signed)
Pt presents with vomiting, body aches and chills.

## 2018-02-11 NOTE — Discharge Instructions (Signed)
Small frequent sips of fluids- Pedialyte, Gatorade, water, broth- to maintain hydration.   Zofran as needed for nausea.  Tylenol as needed for pain.  Liquid diet today, may advance to bland diet as tolerated- crackers, rice, bananas, toast.  Once bland diet is tolerated advance to regular diet as tolerated. If symptoms worsen, increased pain, dehydration, no urine output in 8-10 hours, or do not improve in the next week to return to be seen or to follow up with your PCP.

## 2018-02-11 NOTE — ED Provider Notes (Signed)
[facility]    CSN: [id number] Arrival date & time: 02/11/18  [id number]     History   Chief Complaint Chief Complaint  Patient presents with  . vomiting, body aches, chills    HPI Monique Aguilar is a 52 y.o. female.   Monique AngCathy presents with complaints of nausea, vomiting, diarrhea and body aches which started yesterday. Felt nausea two days ago but symptoms really developed yesterday. Headache. Drinking some. Tried eating crackers last night which she vomited. Vomited twice this morning and has had diarrhea. No blood in vomit or stool. No specific fevers. 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 No specific abdominal pain but feels abdominal "discomfort." urinating without difficulty. Tried taking ibuprofen yesterday which caused her to vomit. No skin rash. Hx of chf, htn, migraines, anemia.     ROS per HPI.      Past Medical History:  Diagnosis Date  . Abnormal CT of the chest    a. 10/2013: 4mm RML pulm nodule, axillary lymphadenopathy. Instructed to f/u PCP.  Monique Aguilar. Anomalous right coronary artery   . Atypical chest pain    a. 10/2013: suspected GI etiology. Nuc - nonischemic, EF 39% but visually higher - f/u echo to clarify showed EF 50-55% with only mild LVH, no significant valvuar disease.  . Chronic diastolic congestive heart failure (HCC)   . HTN (hypertension)   . Low back pain   . Microcytic anemia    a. No prior workup.  . Migraines     Patient Active Problem List   Diagnosis Date Noted  . Acute non-recurrent frontal sinusitis 10/10/2016  . Left breast mass 08/15/2016  . Migraine without aura and without status migrainosus, not intractable 08/15/2016  . Exertional shortness of breath 04/24/2015  . Anomalous right coronary artery   . Chronic diastolic congestive heart failure (HCC)   . Hyperlipidemia 05/31/2014  . Family history of premature coronary artery disease 05/31/2014  . Morbid obesity (HCC) 05/31/2014  . HTN  (hypertension) 10/11/2013  . Atypical chest pain 12/17/2012  . Microcytic anemia 12/16/2012  . Renal insufficiency 12/16/2012    Past Surgical History:  Procedure Laterality Date  . CESAREAN SECTION  1988  . TONSILLECTOMY  1992?  . TUBAL LIGATION  1995    OB History   None      Home Medications    Prior to Admission medications   Medication Sig Start Date End Date Taking? Authorizing Provider  acetaminophen (TYLENOL) 500 MG tablet Take 1 tablet (500 mg total) by mouth every 6 (six) hours as needed. 02/11/18   Monique Aguilar, Monique Aguilar B, NP  amitriptyline (ELAVIL) 25 MG tablet Take by mouth 12.5 mg nightly for one week, then 25 mg nightly for one week, then 50 mg nightly 08/15/16   Monique Aguilar, Josalyn, MD  aspirin 81 MG chewable tablet Chew by mouth daily.    [provider]  atenolol (TENORMIN) 50 MG tablet TAKE 1/2 TABLET BY MOUTH DAILY 08/28/16   Lars MassonNelson, Katarina H, MD  chlorpheniramine-HYDROcodone [facility](TUSSIONEX PENNKINETIC ER) 10-8 MG/5ML SUER Take 5 mLs by mouth every 12 (twelve) hours as needed for cough. 09/02/17   Monique SidleLauenstein, Kurt, MD  fluticasone (FLONASE) 50 MCG/ACT nasal spray Place 2 sprays into both nostrils daily. 10/30/16   Anders Aguilar, Monique M, PA-C  furosemide (LASIX) 40 MG tablet Take 1 tablet (40 mg total) by mouth daily. 03/21/16   Janetta Aguilar, Monique R, PA-C  ibuprofen (ADVIL,MOTRIN) 200 MG tablet Take 200 mg  by mouth every 6 (six) hours as needed.    [provider]  irbesartan (AVAPRO) 150 MG tablet TAKE 1 TABLET BY MOUTH DAILY. 08/28/16   Monique Aguilar, Monique Nestle, MD  ondansetron (ZOFRAN-ODT) 4 MG disintegrating tablet Take 1 tablet (4 mg total) by mouth every 8 (eight) hours as needed for nausea or vomiting. 02/11/18   Monique Haber, NP  oseltamivir (TAMIFLU) 75 MG capsule Take 1 capsule (75 mg total) by mouth every 12 (twelve) hours. 09/02/17   Monique Sidle, MD  potassium chloride (K-DUR) 10 MEQ tablet TAKE 1 TABLET (10 MEQ TOTAL) BY MOUTH DAILY. 06/04/16   Lars Masson, MD  SUMAtriptan (IMITREX) 25 MG tablet Take 1 tablet (25 mg total) by mouth every 2 (two) hours as needed for migraine. May repeat in 2 hours if headache persists or recurs. Do not exceed 2 doses in one week. 08/15/16   Monique Phi, MD    Family History Family History  Problem Relation Age of Onset  . Heart disease Father        Dx early 61's (? heart valve)  . Heart attack Father 85  . Congestive Heart Failure Mother   . CAD Neg Hx     Social History Social History   Tobacco Use  . Smoking status: Never Smoker  . Smokeless tobacco: Never Used  Substance Use Topics  . Alcohol use: No  . Drug use: No     Allergies   Patient has no known allergies.   Review of Systems Review of Systems   Physical Exam Triage Vital Signs ED Triage Vitals  Enc Vitals Group     BP 02/11/18 0845 (!) 145/90     Pulse Rate 02/11/18 0845 74     Resp 02/11/18 0845 16     Temp 02/11/18 0845 98.8 F (37.1 C)     Temp Source 02/11/18 0845 Oral     SpO2 02/11/18 0845 100 %     Weight --      Height --      Head Circumference --      Peak Flow --      Pain Score 02/11/18 0850 6     Pain Loc --      Pain Edu? --      Excl. in GC? --    No data found.  Updated Vital Signs BP (!) 145/90 (BP Location: Right Arm)   Pulse 74   Temp 98.8 F (37.1 C) (Oral)   Resp 16   LMP 01/26/2018   SpO2 100%    Physical Exam  Constitutional: She is oriented to person, place, and time. She appears well-developed and well-nourished. No distress.  Cardiovascular: Normal rate, regular rhythm and normal heart sounds.  Pulmonary/Chest: Effort normal and breath sounds normal.  Abdominal: Soft. Bowel sounds are increased. There is generalized tenderness. There is no rigidity, no rebound, no guarding, no CVA tenderness, no tenderness at McBurney's point and negative Murphy's sign.  Very mild generalized discomfort to abdomen   Neurological: She is alert and oriented to person, place, and  time.  Skin: Skin is warm and dry.     UC Treatments / Results  Labs (all labs ordered are listed, but only abnormal results are displayed) Labs Reviewed - No data to display  EKG None  Radiology No results found.  Procedures Procedures (including critical care time)  Medications Ordered in UC Medications  ondansetron (ZOFRAN-ODT) disintegrating tablet 4 mg (has no administration in time range)  ketorolac (TORADOL) injection 60 mg (has no administration in time range)    Initial Impression / Assessment and Plan / UC Course  I have reviewed the triage vital signs and the nursing notes.  Pertinent labs & imaging results that were available during my care of the patient were reviewed by me and considered in my medical decision making (see chart for details).     Afebrile. Non toxic in appearance. No tachycardia or tachypnea. No red flag findings on exam. History and physical consistent with viral illness.  Supportive cares recommended. zofran and toradol provided in clinic today. zofran script. Push fluids, bland diet as tolerated. Return precautions provided. Patient verbalized understanding and agreeable to plan.    Final Clinical Impressions(s) / UC Diagnoses   Final diagnoses:  Gastroenteritis     Discharge Instructions     Small frequent sips of fluids- Pedialyte, Gatorade, water, broth- to maintain hydration.   Zofran as needed for nausea.  Tylenol as needed for pain.  Liquid diet today, may advance to bland diet as tolerated- crackers, rice, bananas, toast.  Once bland diet is tolerated advance to regular diet as tolerated. If symptoms worsen, increased pain, dehydration, no urine output in 8-10 hours, or do not improve in the next week to return to be seen or to follow up with your PCP.     ED Prescriptions    Medication Sig Dispense Auth. Provider   ondansetron (ZOFRAN-ODT) 4 MG disintegrating tablet Take 1 tablet (4 mg total) by mouth every 8 (eight) hours  as needed for nausea or vomiting. 12 tablet Linus Mako B, NP   acetaminophen (TYLENOL) 500 MG tablet Take 1 tablet (500 mg total) by mouth every 6 (six) hours as needed. 30 tablet Monique Haber, NP     Controlled Substance Prescriptions [place] Controlled Substance Registry consulted? Not Applicable   Monique Haber, NP 02/11/18 [phone]

## 2018-02-17 ENCOUNTER — Other Ambulatory Visit: Payer: Self-pay | Admitting: Cardiology

## 2018-02-17 MED FILL — ATENOLOL 50 MG TABLET: 50 | 30 days supply | Qty: 15 | Fill #0

## 2018-04-02 ENCOUNTER — Telehealth: Payer: Self-pay | Admitting: Obstetrics and Gynecology

## 2018-04-02 ENCOUNTER — Encounter: Payer: Self-pay | Admitting: Internal Medicine

## 2018-04-02 ENCOUNTER — Ambulatory Visit: Payer: BLUE CROSS/BLUE SHIELD | Attending: Internal Medicine | Admitting: Internal Medicine

## 2018-04-02 VITALS — BP 130/80 | HR 90 | Temp 98.7°F | Resp 16 | Wt 233.0 lb

## 2018-04-02 DIAGNOSIS — Z1231 Encounter for screening mammogram for malignant neoplasm of breast: Secondary | ICD-10-CM | POA: Diagnosis not present

## 2018-04-02 DIAGNOSIS — N951 Menopausal and female climacteric states: Secondary | ICD-10-CM | POA: Diagnosis not present

## 2018-04-02 DIAGNOSIS — Z79899 Other long term (current) drug therapy: Secondary | ICD-10-CM | POA: Diagnosis not present

## 2018-04-02 DIAGNOSIS — Z6837 Body mass index (BMI) 37.0-37.9, adult: Secondary | ICD-10-CM | POA: Insufficient documentation

## 2018-04-02 DIAGNOSIS — I10 Essential (primary) hypertension: Secondary | ICD-10-CM | POA: Diagnosis not present

## 2018-04-02 DIAGNOSIS — Z8249 Family history of ischemic heart disease and other diseases of the circulatory system: Secondary | ICD-10-CM | POA: Insufficient documentation

## 2018-04-02 DIAGNOSIS — Z7982 Long term (current) use of aspirin: Secondary | ICD-10-CM | POA: Diagnosis not present

## 2018-04-02 DIAGNOSIS — Z7951 Long term (current) use of inhaled steroids: Secondary | ICD-10-CM | POA: Diagnosis not present

## 2018-04-02 DIAGNOSIS — G43909 Migraine, unspecified, not intractable, without status migrainosus: Secondary | ICD-10-CM | POA: Insufficient documentation

## 2018-04-02 DIAGNOSIS — I110 Hypertensive heart disease with heart failure: Secondary | ICD-10-CM | POA: Diagnosis not present

## 2018-04-02 DIAGNOSIS — D509 Iron deficiency anemia, unspecified: Secondary | ICD-10-CM | POA: Diagnosis not present

## 2018-04-02 DIAGNOSIS — E785 Hyperlipidemia, unspecified: Secondary | ICD-10-CM | POA: Diagnosis not present

## 2018-04-02 DIAGNOSIS — Z1239 Encounter for other screening for malignant neoplasm of breast: Secondary | ICD-10-CM

## 2018-04-02 DIAGNOSIS — Z1211 Encounter for screening for malignant neoplasm of colon: Secondary | ICD-10-CM | POA: Diagnosis not present

## 2018-04-02 DIAGNOSIS — I5032 Chronic diastolic (congestive) heart failure: Secondary | ICD-10-CM | POA: Insufficient documentation

## 2018-04-02 MED ORDER — IRBESARTAN 150 MG PO TABS: 150.0000 mg | ORAL_TABLET | Freq: Every day | ORAL | 6 refills | Status: DC

## 2018-04-02 MED ORDER — ATENOLOL 50 MG PO TABS: 25.0000 mg | ORAL_TABLET | Freq: Every day | ORAL | 4 refills | Status: DC

## 2018-04-02 MED ORDER — POTASSIUM CHLORIDE ER 10 MEQ PO TBCR: 10.0000 meq | EXTENDED_RELEASE_TABLET | Freq: Every day | ORAL | 2 refills | Status: DC

## 2018-04-02 MED ORDER — FUROSEMIDE 40 MG PO TABS: 40.0000 mg | ORAL_TABLET | Freq: Every day | ORAL | 3 refills | Status: DC

## 2018-04-02 MED FILL — ATENOLOL 50 MG TABLET: 50 | 30 days supply | Qty: 15 | Fill #0

## 2018-04-02 MED FILL — POTASSIUM CL 10 MEQ TAB SA: 10 | 90 days supply | Qty: 90 | Fill #0

## 2018-04-02 MED FILL — IRBESARTAN 150 MG TABLET: 150 | 30 days supply | Qty: 30 | Fill #0

## 2018-04-02 MED FILL — FUROSEMIDE 40 MG TAB: 40 | 30 days supply | Qty: 30 | Fill #0

## 2018-04-02 NOTE — Progress Notes (Signed)
Pt states she has not had a period in 3 months  Pt states she has been having hot flashes  Pt states she has been sweating and can't sleep at night  Pt states the hot flashes is getting out of control  Pt states her PH balance is off

## 2018-04-02 NOTE — Progress Notes (Signed)
Patient ID: Monique Aguilar, female    DOB: [birth year]  MRN: [id number]  CC: re-establish and Hypertension   Subjective: Monique Aguilar is a 52 y.o. female who presents for chronic ds management and to est with me as PCP. Her concerns today include:  Pt with hx of HTN, dCHF, anemia, migraines, obesity, HL, fhx of CAD  HTN/dCHF:  Needing RF on Avapro; out for a few wks Checks BP 2 x a wk.  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  Menses regular prior to this.  Has had tubal ligation Complains of bad hot flashes x 2 mths.  "Even when I'm at work I'm pouring with sweat."  Sleeps with her conditions had to.  Not sleeping well -gained a few lbs over past 2-3 mths. -little vaginal dryness -History of breast cyst drained LT breast 08/2016. -due for pap but wants to defer and having it done today  Patient Active Problem List   Diagnosis Date Noted  . Acute non-recurrent frontal sinusitis 10/10/2016  . Left breast mass 08/15/2016  . Migraine without aura and without status migrainosus, not intractable 08/15/2016  . Exertional shortness of breath 04/24/2015  . Anomalous right coronary artery   . Chronic diastolic congestive heart failure (HCC)   . Hyperlipidemia 05/31/2014  . Family history of premature coronary artery disease 05/31/2014  . Morbid obesity (HCC) 05/31/2014  . HTN (hypertension) 10/11/2013  . Atypical chest pain 12/17/2012  . Microcytic anemia 12/16/2012  . Renal insufficiency 12/16/2012     Current Outpatient Medications on File Prior to Visit  Medication Sig Dispense Refill  . acetaminophen (TYLENOL) 500 MG tablet Take 1 tablet (500 mg total) by mouth every 6 (six) hours as needed. 30 tablet 0  . amitriptyline (ELAVIL) 25 MG tablet Take by mouth 12.5 mg nightly for one week, then 25 mg nightly for one week, then 50 mg nightly 60 tablet 0  .  aspirin 81 MG chewable tablet Chew by mouth daily.    . fluticasone (FLONASE) 50 MCG/ACT nasal spray Place 2 sprays into both nostrils daily. 16 g 6  . SUMAtriptan (IMITREX) 25 MG tablet Take 1 tablet (25 mg total) by mouth every 2 (two) hours as needed for migraine. May repeat in 2 hours if headache persists or recurs. Do not exceed 2 doses in one week. 10 tablet 0   No current facility-administered medications on file prior to visit.     No Known Allergies  Social History   Socioeconomic History  . Marital status: Single    Spouse name: Not on file  . Number of children: Not on file  . Years of education: Not on file  . Highest education level: Not on file  Occupational History  . Not on file  Social Needs  . Financial resource strain: Not on file  . Food insecurity:    Worry: Not on file    Inability: Not on file  . Transportation needs:    Medical: Not on file    Non-medical: Not on file  Tobacco Use  . Smoking status: Never Smoker  . Smokeless tobacco: Never Used  Substance and Sexual Activity  . Alcohol use: No  . Drug use: No  . Sexual activity: Not Currently    Birth control/protection: Surgical  Lifestyle  . Physical activity:    Days per week:  Not on file    Minutes per session: Not on file  . Stress: Not on file  Relationships  . Social connections:    Talks on phone: Not on file    Gets together: Not on file    Attends religious service: Not on file    Active member of club or organization: Not on file    Attends meetings of clubs or organizations: Not on file    Relationship status: Not on file  . Intimate partner violence:    Fear of current or ex partner: Not on file    Emotionally abused: Not on file    Physically abused: Not on file    Forced sexual activity: Not on file  Other Topics Concern  . Not on file  Social History Narrative  . Not on file    Family History  Problem Relation Age of Onset  . Heart disease Father        Dx early  62's (? heart valve)  . Heart attack Father 2  . Congestive Heart Failure Mother   . CAD Neg Hx     Past Surgical History:  Procedure Laterality Date  . CESAREAN SECTION  1988  . TONSILLECTOMY  1992?  . TUBAL LIGATION  1995    ROS: Review of Systems Negative except as above PHYSICAL EXAM: BP (!) 142/92   Pulse 90   Temp 98.7 F (37.1 C) (Oral)   Resp 16   Wt 233 lb (105.7 kg)   SpO2 98%   BMI 37.61 kg/m   Physical Exam 130/80 General appearance - alert, well appearing, obese middle-age African-American female and in no distress Mental status - normal mood, behavior, speech, dress, motor activity, and thought processes Neck - supple, no significant adenopathy Chest - clear to auscultation, no wheezes, rales or rhonchi, symmetric air entry Heart - normal rate, regular rhythm, normal S1, S2, no murmurs, rubs, clicks or gallops Extremities - peripheral pulses normal, no pedal edema, no clubbing or cyanosis  ASSESSMENT AND PLAN: 1. Essential hypertension Refills given.  DASH diet encouraged. Continue to encourage regular exercise for overall health and to help achieve weight loss - CBC - Comprehensive metabolic panel - Lipid panel - atenolol (TENORMIN) 50 MG tablet; Take 0.5 tablets (25 mg total) by mouth daily.  Dispense: 120 tablet; Refill: 4 - irbesartan (AVAPRO) 150 MG tablet; Take 1 tablet (150 mg total) by mouth daily.  Dispense: 30 tablet; Refill: 6 - potassium chloride (K-DUR) 10 MEQ tablet; Take 1 tablet (10 mEq total) by mouth daily.  Dispense: 90 tablet; Refill: 2 - furosemide (LASIX) 40 MG tablet; Take 1 tablet (40 mg total) by mouth daily.  Dispense: 30 tablet; Refill: 3  2. Breast cancer screening - MM Digital Screening; Future  3. Colon cancer screening - Ambulatory referral to Gastroenterology  4. Hot flashes due to menopause -Patient informed that symptoms are most consistent with being perimenopausal.  We discussed options for treatment of hot  flashes including HRT versus SSRI or gabapentin she thinks she is interested in HRT. - Ambulatory referral to Gynecology  5. CHF (congestive heart failure), NYHA class I, chronic, diastolic (HCC) Compensated. - potassium chloride (K-DUR) 10 MEQ tablet; Take 1 tablet (10 mEq total) by mouth daily.  Dispense: 90 tablet; Refill: 2 - furosemide (LASIX) 40 MG tablet; Take 1 tablet (40 mg total) by mouth daily.  Dispense: 30 tablet; Refill: 3   Patient was given the opportunity to ask questions.  Patient verbalized understanding  of the plan and was able to repeat key elements of the plan.   Orders Placed This Encounter  Procedures  . MM Digital Screening  . CBC  . Comprehensive metabolic panel  . Lipid panel  . Ambulatory referral to Gastroenterology  . Ambulatory referral to Gynecology     Requested Prescriptions   Signed Prescriptions Disp Refills  . atenolol (TENORMIN) 50 MG tablet 120 tablet 4    Sig: Take 0.5 tablets (25 mg total) by mouth daily.  . irbesartan (AVAPRO) 150 MG tablet 30 tablet 6    Sig: Take 1 tablet (150 mg total) by mouth daily.  . potassium chloride (K-DUR) 10 MEQ tablet 90 tablet 2    Sig: Take 1 tablet (10 mEq total) by mouth daily.  . furosemide (LASIX) 40 MG tablet 30 tablet 3    Sig: Take 1 tablet (40 mg total) by mouth daily.    Return in about 3 months (around 07/03/2018).  Jonah Blueeborah Carmelo Reidel, MD, FACP

## 2018-04-02 NOTE — Patient Instructions (Signed)
Menopause Menopause is the normal time of life when menstrual periods stop completely. Menopause is complete when you have missed 12 consecutive menstrual periods. It usually occurs between the ages of 48 years and 55 years. Very rarely does a woman develop menopause before the age of 40 years. At menopause, your ovaries stop producing the female hormones estrogen and progesterone. This can cause undesirable symptoms and also affect your health. Sometimes the symptoms may occur 4-5 years before the menopause begins. There is no relationship between menopause and:  Oral contraceptives.  Number of children you had.  Race.  The age your menstrual periods started (menarche).  Heavy smokers and very thin women may develop menopause earlier in life. What are the causes?  The ovaries stop producing the female hormones estrogen and progesterone. Other causes include:  Surgery to remove both ovaries.  The ovaries stop functioning for no known reason.  Tumors of the pituitary gland in the brain.  Medical disease that affects the ovaries and hormone production.  Radiation treatment to the abdomen or pelvis.  Chemotherapy that affects the ovaries.  What are the signs or symptoms?  Hot flashes.  Night sweats.  Decrease in sex drive.  Vaginal dryness and thinning of the vagina causing painful intercourse.  Dryness of the skin and developing wrinkles.  Headaches.  Tiredness.  Irritability.  Memory problems.  Weight gain.  Bladder infections.  Hair growth of the face and chest.  Infertility. More serious symptoms include:  Loss of bone (osteoporosis) causing breaks (fractures).  Depression.  Hardening and narrowing of the arteries (atherosclerosis) causing heart attacks and strokes.  How is this diagnosed?  When the menstrual periods have stopped for 12 straight months.  Physical exam.  Hormone studies of the blood. How is this treated? There are many treatment  choices and nearly as many questions about them. The decisions to treat or not to treat menopausal changes is an individual choice made with your health care provider. Your health care provider can discuss the treatments with you. Together, you can decide which treatment will work best for you. Your treatment choices may include:  Hormone therapy (estrogen and progesterone).  Non-hormonal medicines.  Treating the individual symptoms with medicine (for example antidepressants for depression).  Herbal medicines that may help specific symptoms.  Counseling by a psychiatrist or psychologist.  Group therapy.  Lifestyle changes including: ? Eating healthy. ? Regular exercise. ? Limiting caffeine and alcohol. ? Stress management and meditation.  No treatment.  Follow these instructions at home:  Take the medicine your health care provider gives you as directed.  Get plenty of sleep and rest.  Exercise regularly.  Eat a diet that contains calcium (good for the bones) and soy products (acts like estrogen hormone).  Avoid alcoholic beverages.  Do not smoke.  If you have hot flashes, dress in layers.  Take supplements, calcium, and vitamin D to strengthen bones.  You can use over-the-counter lubricants or moisturizers for vaginal dryness.  Group therapy is sometimes very helpful.  Acupuncture may be helpful in some cases. Contact a health care provider if:  You are not sure you are in menopause.  You are having menopausal symptoms and need advice and treatment.  You are still having menstrual periods after age 55 years.  You have pain with intercourse.  Menopause is complete (no menstrual period for 12 months) and you develop vaginal bleeding.  You need a referral to a specialist (gynecologist, psychiatrist, or psychologist) for treatment. Get help right   away if:  You have severe depression.  You have excessive vaginal bleeding.  You fell and think you have a  broken bone.  You have pain when you urinate.  You develop leg or chest pain.  You have a fast pounding heart beat (palpitations).  You have severe headaches.  You develop vision problems.  You feel a lump in your breast.  You have abdominal pain or severe indigestion. This information is not intended to replace advice given to you by your health care provider. Make sure you discuss any questions you have with your health care provider. Document Released: 10/11/2003 Document Revised: 12/27/2015 Document Reviewed: 02/17/2013 Elsevier Interactive Patient Education  2017 [employer]vier Inc[employer]  

## 2018-04-02 NOTE — Telephone Encounter (Signed)
Called and left a message for patient to call back to schedule a new patient doctor referral appointment with our office to see any provider for: Hot flashes due to menopause.

## 2018-04-03 LAB — COMPREHENSIVE METABOLIC PANEL
A/G RATIO: 1.3 (ref 1.2–2.2)
ALT: 16 IU/L (ref 0–32)
AST: 19 IU/L (ref 0–40)
Albumin: 4.4 g/dL (ref 3.5–5.5)
Alkaline Phosphatase: 84 IU/L (ref 39–117)
BUN/Creatinine Ratio: 12 (ref 9–23)
BUN: 14 mg/dL (ref 6–24)
Bilirubin Total: <0.2 mg/dL (ref 0.0–1.2)
CHLORIDE: 102 mmol/L (ref 96–106)
CO2: 27 mmol/L (ref 20–29)
Calcium: 9.3 mg/dL (ref 8.7–10.2)
Creatinine, Ser: 1.19 mg/dL — ABNORMAL HIGH (ref 0.57–1.00)
GFR calc non Af Amer: 53 mL/min/{1.73_m2} — ABNORMAL LOW (ref >59)
GFR, EST AFRICAN AMERICAN: 61 mL/min/{1.73_m2} (ref >59)
GLUCOSE: 72 mg/dL (ref 65–99)
Globulin, Total: 3.4 g/dL (ref 1.5–4.5)
POTASSIUM: 4.1 mmol/L (ref 3.5–5.2)
Sodium: 139 mmol/L (ref 134–144)
TOTAL PROTEIN: 7.8 g/dL (ref 6.0–8.5)

## 2018-04-03 LAB — LIPID PANEL
CHOL/HDL RATIO: 3 ratio (ref 0.0–4.4)
Cholesterol, Total: 180 mg/dL (ref 100–199)
HDL: 60 mg/dL (ref >39)
LDL CALC: 101 mg/dL — AB (ref 0–99)
TRIGLYCERIDES: 94 mg/dL (ref 0–149)
VLDL Cholesterol Cal: 19 mg/dL (ref 5–40)

## 2018-04-03 LAB — CBC
Hematocrit: 34.4 % (ref 34.0–46.6)
Hemoglobin: 11.2 g/dL (ref 11.1–15.9)
MCH: 25.3 pg — AB (ref 26.6–33.0)
MCHC: 32.6 g/dL (ref 31.5–35.7)
MCV: 78 fL — ABNORMAL LOW (ref 79–97)
PLATELETS: 318 10*3/uL (ref 150–450)
RBC: 4.42 x10E6/uL (ref 3.77–5.28)
RDW: 15.1 % (ref 12.3–15.4)
WBC: 4.7 10*3/uL (ref 3.4–10.8)

## 2018-04-06 ENCOUNTER — Telehealth: Payer: Self-pay

## 2018-04-06 NOTE — Telephone Encounter (Signed)
Contacted pt to go over lab results pt didn't answer lvm asking pt to give me a call at her earliest convenience  If pt calls back please give results: she still has a mild anemia that has remained stable for the past 3 yrs. LDL cholesterol mildly elevated at 101 with goal being less than 100. Healthy eating habits and regulare exercise will help to lower LDL  Kidney function not 100% but stable. LFTs nl.

## 2018-04-07 NOTE — Telephone Encounter (Signed)
Called and left a message for patient to call back to schedule a new patient doctor referral appointment with our office to see any provider for: Hot flashes due to menopause.

## 2018-04-08 ENCOUNTER — Encounter: Payer: Self-pay | Admitting: Gastroenterology

## 2018-04-08 DIAGNOSIS — Z1231 Encounter for screening mammogram for malignant neoplasm of breast: Secondary | ICD-10-CM | POA: Diagnosis not present

## 2018-04-22 ENCOUNTER — Telehealth: Payer: Self-pay

## 2018-04-22 NOTE — Telephone Encounter (Signed)
Patient No Showed for PV today. A message was left on voice mail to call and reschedule PV before 5:00 Pm today. Patient was informed that her scheduled procedure would be cancelled if she doesn't reschedule today. A no show letter will be mailed and procedure will be cancelled per [facility] guidelines.  Monique DaneNancy [place] Souter, LPN (PV )

## 2018-05-07 ENCOUNTER — Encounter: Payer: Self-pay | Admitting: Gastroenterology

## 2018-05-14 ENCOUNTER — Ambulatory Visit
Admission: RE | Admit: 2018-05-14 | Discharge: 2018-05-14 | Disposition: A | Discharge status: Home / Self Care | Payer: BLUE CROSS/BLUE SHIELD | Source: Ambulatory Visit | Attending: Internal Medicine | Admitting: Internal Medicine

## 2018-05-14 DIAGNOSIS — Z1239 Encounter for other screening for malignant neoplasm of breast: Secondary | ICD-10-CM

## 2018-05-17 ENCOUNTER — Other Ambulatory Visit: Payer: Self-pay | Admitting: Internal Medicine

## 2018-05-17 DIAGNOSIS — N63 Unspecified lump in unspecified breast: Secondary | ICD-10-CM

## 2018-05-21 ENCOUNTER — Ambulatory Visit (AMBULATORY_SURGERY_CENTER): Payer: Self-pay

## 2018-05-21 VITALS — Ht 66.0 in | Wt 231.4 lb

## 2018-05-21 DIAGNOSIS — Z1211 Encounter for screening for malignant neoplasm of colon: Secondary | ICD-10-CM

## 2018-05-21 MED ORDER — NA SULFATE-K SULFATE-MG SULF 17.5-3.13-1.6 GM/177ML PO SOLN: 1.0000 | Freq: Once | ORAL | 0 refills | Status: AC

## 2018-05-21 NOTE — Progress Notes (Signed)
No egg or soy allergy known to patient  No issues with past sedation with any surgeries  or procedures, no intubation problems  No diet pills per patient No home 02 use per patient  No blood thinners per patient  Pt denies issues with constipation  No A fib or A flutter  EMMI video sent to pt's e mail  Pt. declined 

## 2018-05-24 ENCOUNTER — Encounter: Payer: Self-pay | Admitting: Gastroenterology

## 2018-05-28 ENCOUNTER — Encounter: Payer: Self-pay | Admitting: Gastroenterology

## 2018-05-28 ENCOUNTER — Telehealth: Payer: Self-pay | Admitting: Gastroenterology

## 2018-05-28 NOTE — Telephone Encounter (Signed)
Attempted to call patient back but it went to voicemail. Left her a message that her doctor would like to reschedule her colonoscopy and pre treat with antiemetics next time. Will wait for her phone call.    Patient called back and rescheduled her previsit and colonoscopy date for 11/5 and 11/15. She is aware of times and dates as per her request. Today's visit was cancelled.

## 2018-06-02 ENCOUNTER — Telehealth: Payer: Self-pay

## 2018-06-02 NOTE — Telephone Encounter (Signed)
Dr. Barron Alvine,  This patient vomited and could not keep Suprep down. She has rescheduled her pre visit (11/5) and colonoscopy (06/18/18).  Is a Miralax prep ok and would you like to prescribe Zofran or Reglan prior to beginning her prep?  Thank you, Patrina Levering RN

## 2018-06-02 NOTE — Telephone Encounter (Signed)
Yes, the Miralax prep would be ok with me. And agreed- please provide an Rx for Zofran 4 mg PO prn nausea, to take 1 prior to starting prep, then prn every 6 hours for nausea, #6 RF0.  Thank you.

## 2018-06-03 ENCOUNTER — Telehealth: Payer: Self-pay | Admitting: Obstetrics and Gynecology

## 2018-06-03 ENCOUNTER — Ambulatory Visit
Admission: RE | Admit: 2018-06-03 | Discharge: 2018-06-03 | Disposition: A | Discharge status: Home / Self Care | Payer: BLUE CROSS/BLUE SHIELD | Source: Ambulatory Visit | Attending: Internal Medicine | Admitting: Internal Medicine

## 2018-06-03 DIAGNOSIS — R922 Inconclusive mammogram: Secondary | ICD-10-CM | POA: Diagnosis not present

## 2018-06-03 DIAGNOSIS — N63 Unspecified lump in unspecified breast: Secondary | ICD-10-CM

## 2018-06-03 DIAGNOSIS — N6001 Solitary cyst of right breast: Secondary | ICD-10-CM | POA: Diagnosis not present

## 2018-06-03 NOTE — Telephone Encounter (Signed)
Called and left a message for patient to call back to schedule a new patient doctor referral appointment with our office to see any provider for: Hot flashes due to menopause. °

## 2018-06-04 ENCOUNTER — Ambulatory Visit: Payer: Self-pay | Admitting: Obstetrics and Gynecology

## 2018-06-04 ENCOUNTER — Encounter

## 2018-06-04 NOTE — Telephone Encounter (Signed)
Ok for Miralax bowel prep and  Zofran 4 mg-take 1 pill prior to starting bowel prep per Dr Barron Alvine.. Can repeat every 6 hours prn.  Will write Zofran instructions with patient's bowel prep instructions when she comes into the office on 06/08/18 for her PV. Thanks,Magally Vahle

## 2018-06-08 ENCOUNTER — Other Ambulatory Visit: Payer: BLUE CROSS/BLUE SHIELD

## 2018-06-09 NOTE — Telephone Encounter (Signed)
Called and left a message for patient to call back to schedule a new patient doctor referral appointment with our office to see any provider for: Hot flashes due to menopause. °

## 2018-06-18 ENCOUNTER — Encounter: Payer: Self-pay | Admitting: Gastroenterology

## 2018-07-09 ENCOUNTER — Ambulatory Visit: Payer: Self-pay | Admitting: Internal Medicine

## 2018-07-21 MED FILL — ATENOLOL 50 MG TABLET: 50 | 30 days supply | Qty: 15 | Fill #1

## 2018-07-21 MED FILL — IRBESARTAN 150 MG TABLET: 150 | 30 days supply | Qty: 30 | Fill #1

## 2018-08-19 ENCOUNTER — Telehealth: Payer: Self-pay | Admitting: Internal Medicine

## 2018-08-19 NOTE — Telephone Encounter (Signed)
Pt says Monique Aguilar has been recalled so she needs a different BP medication. Pt uses [facility].  Pt ph  [phone].

## 2018-08-23 NOTE — Telephone Encounter (Signed)
Left Vm for patient to call back. I have checked with the pharmacy, her medication supply was not effected by the recall, only certain manufacturers we effected and the one carried by [facility] was not recalled. She should continue taking that medication as prescribed.

## 2018-10-08 ENCOUNTER — Ambulatory Visit: Payer: BLUE CROSS/BLUE SHIELD | Attending: Internal Medicine | Admitting: Internal Medicine

## 2018-10-08 ENCOUNTER — Encounter: Payer: Self-pay | Admitting: Internal Medicine

## 2018-10-08 VITALS — BP 122/88 | HR 86 | Temp 98.0°F | Resp 16 | Wt 243.4 lb

## 2018-10-08 DIAGNOSIS — Z91018 Allergy to other foods: Secondary | ICD-10-CM | POA: Insufficient documentation

## 2018-10-08 DIAGNOSIS — G43009 Migraine without aura, not intractable, without status migrainosus: Secondary | ICD-10-CM | POA: Diagnosis not present

## 2018-10-08 DIAGNOSIS — E669 Obesity, unspecified: Secondary | ICD-10-CM

## 2018-10-08 DIAGNOSIS — N289 Disorder of kidney and ureter, unspecified: Secondary | ICD-10-CM

## 2018-10-08 DIAGNOSIS — Z1211 Encounter for screening for malignant neoplasm of colon: Secondary | ICD-10-CM

## 2018-10-08 DIAGNOSIS — I10 Essential (primary) hypertension: Secondary | ICD-10-CM | POA: Diagnosis not present

## 2018-10-08 DIAGNOSIS — I5032 Chronic diastolic (congestive) heart failure: Secondary | ICD-10-CM

## 2018-10-08 DIAGNOSIS — N393 Stress incontinence (female) (male): Secondary | ICD-10-CM | POA: Diagnosis not present

## 2018-10-08 DIAGNOSIS — Z6839 Body mass index (BMI) 39.0-39.9, adult: Secondary | ICD-10-CM

## 2018-10-08 DIAGNOSIS — K029 Dental caries, unspecified: Secondary | ICD-10-CM

## 2018-10-08 MED ORDER — ATENOLOL 50 MG PO TABS: 50.0000 mg | ORAL_TABLET | Freq: Every day | ORAL | 4 refills | Status: DC

## 2018-10-08 MED ORDER — SUMATRIPTAN SUCCINATE 25 MG PO TABS: ORAL_TABLET | ORAL | 1 refills | Status: DC

## 2018-10-08 MED FILL — ATENOLOL 50 MG TABLET: 50 | 30 days supply | Qty: 30 | Fill #0

## 2018-10-08 MED FILL — SUMATRIPTAN SUCC 25 MG TAB: 25 | 30 days supply | Qty: 9 | Fill #0

## 2018-10-08 NOTE — Patient Instructions (Signed)
Increase atenolol to 50 mg daily.  Use Imitrex as needed for headache as discussed.  Kegel Exercises Kegel exercises help strengthen the muscles that support the rectum, vagina, small intestine, bladder, and uterus. Doing Kegel exercises can help:  Improve bladder and bowel control.  Improve sexual response.  Reduce problems and discomfort during pregnancy. Kegel exercises involve squeezing your pelvic floor muscles, which are the same muscles you squeeze when you try to stop the flow of urine. The exercises can be done while sitting, standing, or lying down, but it is best to vary your position. Exercises 1. Squeeze your pelvic floor muscles tight. You should feel a tight lift in your rectal area. If you are a female, you should also feel a tightness in your vaginal area. Keep your stomach, buttocks, and legs relaxed. 2. Hold the muscles tight for up to 10 seconds. 3. Relax your muscles. Repeat this exercise 50 times a day or as many times as told by your health care provider. Continue to do this exercise for at least 4-6 weeks or for as long as told by your health care provider. This information is not intended to replace advice given to you by your health care provider. Make sure you discuss any questions you have with your health care provider. Document Released: 07/07/2012 Document Revised: 12/01/2016 Document Reviewed: 06/10/2015 Elsevier Interactive Patient Education  2019 [employer].

## 2018-10-08 NOTE — Progress Notes (Signed)
Patient ID: KHLOI SPEARE, female    DOB: 13-Oct-1965  MRN: [id number]  CC: Hypertension   Subjective: Sun Lallo is a 53 y.o. female who presents for chronic disease management. Her concerns today include:  Pt with hx of HTN, dCHF, anemia, migraines, obesity, HL, fhx of CAD  HTTN/dCHF: checks BP Q 2 days.  Gives range 160/90, 132/88 Limits salt in foods.  Uses Ms. DASH for seasoning No SOB, sleeps on 1- 2 pillows, no CP/PND. little LE edema  Obesity:  Doing more walking.  Stopped drinking sodas, eating more veggies.  Gained 12 pounds since October of last year.  She wonders whether it is fluid accumulation in her body.  She reports that when she takes furosemide she only urinates once during the day at work Requests referral to an allergist.  She would like to know which fruits she can safely eat.  Reports swelling in the throat and face and problems breathing when she eats apples and strawberries.  So she has not eaten any foods in years and would like to now incorporated into her eating habits   Complains of bladder leakage with coughing, sneezing and laughing for past 2 wks.  Has to go right away when she has urge to urinate but has not had any accidents this way No dysuria  Migraines:  Bothering her for past few wks. More frequent; almost daily. Associated with photophobia, phenophobia, dizziness sometimes.  No N/V.  Lasts 30mins-2 hrs with taking Tylenol and using cold compresses to the forehead.  Usually located at front and back of head.  Did well with Imitrex in the past but has been out of it for quite some time  HM:  Referred for c-scope on last visit.  Got nausea/vomiting with med that was given.  Plan was to give an antiemetic with the prep but patient never called back.  Pap due   Patient Active Problem List   Diagnosis Date Noted  . Acute non-recurrent frontal sinusitis 10/10/2016  . Left breast mass 08/15/2016  . Migraine without aura and without status migrainosus,  not intractable 08/15/2016  . Exertional shortness of breath 04/24/2015  . Anomalous right coronary artery   . Chronic diastolic congestive heart failure (HCC)   . Hyperlipidemia 05/31/2014  . Family history of premature coronary artery disease 05/31/2014  . Morbid obesity (HCC) 05/31/2014  . HTN (hypertension) 10/11/2013  . Atypical chest pain 12/17/2012  . Microcytic anemia 12/16/2012  . Renal insufficiency 12/16/2012     Current Outpatient Medications on File Prior to Visit  Medication Sig Dispense Refill  . acetaminophen (TYLENOL) 500 MG tablet Take 1 tablet (500 mg total) by mouth every 6 (six) hours as needed. 30 tablet 0  . amitriptyline (ELAVIL) 25 MG tablet Take by mouth 12.5 mg nightly for one week, then 25 mg nightly for one week, then 50 mg nightly 60 tablet 0  . aspirin 81 MG chewable tablet Chew by mouth daily.    . fluticasone (FLONASE) 50 MCG/ACT nasal spray Place 2 sprays into both nostrils daily. 16 g 6  . furosemide (LASIX) 40 MG tablet Take 1 tablet (40 mg total) by mouth daily. 30 tablet 3  . irbesartan (AVAPRO) 150 MG tablet Take 1 tablet (150 mg total) by mouth daily. 30 tablet 6  . potassium chloride (K-DUR) 10 MEQ tablet Take 1 tablet (10 mEq total) by mouth daily. 90 tablet 2   No current facility-administered medications on file prior to visit.  No Known Allergies  Social History   Socioeconomic History  . Marital status: Single    Spouse name: Not on file  . Number of children: Not on file  . Years of education: Not on file  . Highest education level: Not on file  Occupational History  . Not on file  Social Needs  . Financial resource strain: Not on file  . Food insecurity:    Worry: Not on file    Inability: Not on file  . Transportation needs:    Medical: Not on file    Non-medical: Not on file  Tobacco Use  . Smoking status: Never Smoker  . Smokeless tobacco: Never Used  Substance and Sexual Activity  . Alcohol use: No  . Drug use:  No  . Sexual activity: Not Currently    Birth control/protection: Surgical  Lifestyle  . Physical activity:    Days per week: Not on file    Minutes per session: Not on file  . Stress: Not on file  Relationships  . Social connections:    Talks on phone: Not on file    Gets together: Not on file    Attends religious service: Not on file    Active member of club or organization: Not on file    Attends meetings of clubs or organizations: Not on file    Relationship status: Not on file  . Intimate partner violence:    Fear of current or ex partner: Not on file    Emotionally abused: Not on file    Physically abused: Not on file    Forced sexual activity: Not on file  Other Topics Concern  . Not on file  Social History Narrative  . Not on file    Family History  Problem Relation Age of Onset  . Heart disease Father        Dx early 31's (? heart valve)  . Heart attack Father 54  . Congestive Heart Failure Mother   . CAD Neg Hx   . Colon cancer Neg Hx   . Colon polyps Neg Hx   . Esophageal cancer Neg Hx   . Stomach cancer Neg Hx   . Rectal cancer Neg Hx     Past Surgical History:  Procedure Laterality Date  . BREAST CYST ASPIRATION    . CESAREAN SECTION  1988  . TONSILLECTOMY  1992?  . TUBAL LIGATION  1995  . WISDOM TOOTH EXTRACTION  1995    ROS: Review of Systems Negative except as stated above  PHYSICAL EXAM: BP 122/88   Pulse 86   Temp 98 F (36.7 C) (Oral)   Resp 16   Wt 243 lb 6.4 oz (110.4 kg)   SpO2 100%   BMI 39.29 kg/m   Wt Readings from Last 3 Encounters:  10/08/18 243 lb 6.4 oz (110.4 kg)  05/21/18 231 lb 6.4 oz (105 kg)  04/02/18 233 lb (105.7 kg)   Physical Exam General appearance - alert, well appearing, and in no distress Mental status - normal mood, behavior, speech, dress, motor activity, and thought processes Mouth - mucous membranes moist, pharynx normal without lesions.  Several decayed tooth broken off in the gum.  She is also  wearing partials. Neck - supple, no significant adenopathy Chest - clear to auscultation, no wheezes, rales or rhonchi, symmetric air entry Heart - normal rate, regular rhythm, normal S1, S2, no murmurs, rubs, clicks or gallops.  No JVD Extremities - peripheral pulses normal, no pedal  edema, no clubbing or cyanosis Neurologic -cranial nerves grossly intact.  Power 5/5 throughout.  Gross sensation intact.  Gait is normal.  Romberg is negative  CMP Latest Ref Rng & Units 04/02/2018 08/15/2016 07/26/2016  Glucose 65 - 99 mg/dL 72 79 161(W)  BUN 6 - 24 mg/dL Creatinine 0.57 - 1.00 mg/dL 9.60(A) 5.40 9.81(X)  Sodium 134 - 144 mmol/L 139 135 135  Potassium 3.5 - 5.2 mmol/L 4.1 4.1 3.9  Chloride 96 - 106 mmol/L 102 103 104  CO2 20 - 29 mmol/L Calcium 8.7 - 10.2 mg/dL 9.3 9.1 9.6  Total Protein 6.0 - 8.5 g/dL 7.8 - -  Total Bilirubin 0.0 - 1.2 mg/dL <9.1 - -  Alkaline Phos 39 - 117 IU/L 84 - -  AST 0 - 40 IU/L 19 - -  ALT 0 - 32 IU/L 16 - -   Lipid Panel     Component Value Date/Time   CHOL 180 04/02/2018 1601   TRIG 94 04/02/2018 1601   HDL 60 04/02/2018 1601   CHOLHDL 3.0 04/02/2018 1601   CHOLHDL 3.1 10/11/2013 0125   VLDL 32 10/11/2013 0125   LDLCALC 101 (H) 04/02/2018 1601    CBC    Component Value Date/Time   WBC 4.7 04/02/2018 1601   WBC 4.6 07/26/2016 1137   RBC 4.42 04/02/2018 1601   RBC 4.40 07/26/2016 1137   HGB 11.2 04/02/2018 1601   HCT 34.4 04/02/2018 1601   PLT 318 04/02/2018 1601   MCV 78 (L) 04/02/2018 1601   MCH 25.3 (L) 04/02/2018 1601   MCH 25.9 (L) 07/26/2016 1137   MCHC 32.6 04/02/2018 1601   MCHC 31.8 07/26/2016 1137   RDW 15.1 04/02/2018 1601   LYMPHSABS 2.5 10/10/2013 1236   MONOABS 0.4 10/10/2013 1236   EOSABS 0.1 10/10/2013 1236   BASOSABS 0.0 10/10/2013 1236    ASSESSMENT AND PLAN: 1. Essential hypertension Not at goal.  Increase atenolol to 50 mg daily.  Continue DASH diet - atenolol (TENORMIN) 50 MG tablet; Take 1  tablet (50 mg total) by mouth daily.  Dispense: 90 tablet; Refill: 4  2. CHF (congestive heart failure), NYHA class I, chronic, diastolic (HCC) Clinically she is not fluid overloaded.  Continue current dose of furosemide  3. Obesity (BMI 35.0-39.9 without comorbidity) Discussed healthy eating habits.  Patient agreeable to seeing a nutritionist. Encourage some form of aerobic exercise with the goal being 150 minutes/week  4. Migraine without aura and without status migrainosus, not intractable - SUMAtriptan (IMITREX) 25 MG tablet; Take 1 tablet at the start of the headache PO.  May repeat in 2 hours if no improvement.  Dispense: 10 tablet; Refill: 1 Increase atenolol to serve as prophylaxis  5. Stress incontinence Kegel's exercises discussed and encourage  6. Dental cavities Encouraged her to see a dentist  7. Renal insufficiency Mild renal insufficiency on last blood test.  We will recheck today - Basic Metabolic Panel  8. Colon cancer screening - Ambulatory referral to Gastroenterology  9.  Food allergy Referral to allergist Patient was given the opportunity to ask questions.  Patient verbalized understanding of the plan and was able to repeat key elements of the plan.   Orders Placed This Encounter  Procedures  . Basic Metabolic Panel  . Ambulatory referral to Gastroenterology  . Ambulatory referral to Allergy     Requested Prescriptions   Signed Prescriptions Disp Refills  . atenolol (TENORMIN) 50 MG tablet 90 tablet  4    Sig: Take 1 tablet (50 mg total) by mouth daily.  . SUMAtriptan (IMITREX) 25 MG tablet 10 tablet 1    Sig: Take 1 tablet at the start of the headache PO.  May repeat in 2 hours if no improvement.    Return in about 3 months (around 01/08/2019).  Jonah Blue, MD, FACP

## 2018-10-08 NOTE — Progress Notes (Signed)
Pt states she is on 40mg  of lasix and he is only urinating once a day  Pt states her bladder has been leaking  Pt states she is gaining weight se it's like the fluid isn't coming off   Pt states she has been trying to lose weight but she is wanting to talk to pcp about the apple cider pills

## 2018-10-09 LAB — BASIC METABOLIC PANEL
BUN/Creatinine Ratio: 12 (ref 9–23)
BUN: 13 mg/dL (ref 6–24)
CHLORIDE: 105 mmol/L (ref 96–106)
CO2: 21 mmol/L (ref 20–29)
CREATININE: 1.09 mg/dL — AB (ref 0.57–1.00)
Calcium: 9.6 mg/dL (ref 8.7–10.2)
GFR calc Af Amer: 67 mL/min/{1.73_m2} (ref >59)
GFR calc non Af Amer: 58 mL/min/{1.73_m2} — ABNORMAL LOW (ref >59)
GLUCOSE: 76 mg/dL (ref 65–99)
Potassium: 4.5 mmol/L (ref 3.5–5.2)
Sodium: 141 mmol/L (ref 134–144)

## 2018-10-15 ENCOUNTER — Telehealth: Payer: Self-pay

## 2018-10-15 NOTE — Telephone Encounter (Signed)
Contacted pt to go over lab results pt didn't answer left a detailed vm informing pt of results and if he has any questions or concerns to give me a call  

## 2018-10-19 ENCOUNTER — Other Ambulatory Visit: Payer: Self-pay

## 2018-10-19 ENCOUNTER — Encounter (HOSPITAL_COMMUNITY): Payer: Self-pay | Admitting: Emergency Medicine

## 2018-10-19 ENCOUNTER — Ambulatory Visit (HOSPITAL_COMMUNITY)
Admission: EM | Admit: 2018-10-19 | Discharge: 2018-10-19 | Disposition: A | Discharge status: Home / Self Care | Payer: BLUE CROSS/BLUE SHIELD | Attending: Family Medicine | Admitting: Family Medicine

## 2018-10-19 DIAGNOSIS — J069 Acute upper respiratory infection, unspecified: Secondary | ICD-10-CM | POA: Diagnosis not present

## 2018-10-19 MED ORDER — BENZONATATE 200 MG PO CAPS: 200.0000 mg | ORAL_CAPSULE | Freq: Two times a day (BID) | ORAL | 0 refills | Status: DC | PRN

## 2018-10-19 MED ORDER — DM-GUAIFENESIN ER 30-600 MG PO TB12: 1.0000 | ORAL_TABLET | Freq: Two times a day (BID) | ORAL | 0 refills | Status: DC

## 2018-10-19 NOTE — ED Provider Notes (Signed)
[facility]    CSN: [id number] Arrival date & time: 10/19/18  1229     History   Chief Complaint Chief Complaint  Patient presents with  . Cough    HPI Monique Aguilar is a 53 y.o. female.   HPI  Patient presents with productive cough with green phlegm since yesterday. She reports that her chest feels heavy and that she has pain with inspiration, however, she has no difficulty taking a deep breath. 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 She endorses body aches and temperature as high as 99.9 at home last night which she took some tylenol for. She has sinus pressure and some discomfort in her throat and bilateral ears as well.   Past Medical History:  Diagnosis Date  . Abnormal CT of the chest    a. 10/2013: 81mm RML pulm nodule, axillary lymphadenopathy. Instructed to f/u PCP.  [name] Anomalous right coronary artery   . Atypical chest pain    a. 10/2013: suspected GI etiology. Nuc - nonischemic, EF 39% but visually higher - f/u echo to clarify showed EF 50-55% with only mild LVH, no significant valvuar disease.  . Chronic diastolic congestive heart failure (HCC)   . HTN (hypertension)   . Low back pain   . Microcytic anemia    a. No prior workup.  . Migraines     Patient Active Problem List   Diagnosis Date Noted  . Stress incontinence 10/08/2018  . Food allergy 10/08/2018  . Left breast mass 08/15/2016  . Migraine without aura and without status migrainosus, not intractable 08/15/2016  . Anomalous right coronary artery   . Chronic diastolic congestive heart failure (HCC)   . Hyperlipidemia 05/31/2014  . Family history of premature coronary artery disease 05/31/2014  . Morbid obesity (HCC) 05/31/2014  . HTN (hypertension) 10/11/2013  . Microcytic anemia 12/16/2012  . Renal insufficiency 12/16/2012    Past Surgical History:  Procedure Laterality Date  . BREAST CYST ASPIRATION    . CESAREAN SECTION  1988  .  TONSILLECTOMY  1992?  . TUBAL LIGATION  1995  . WISDOM TOOTH EXTRACTION  1995    OB History   No obstetric history on file.      Home Medications    Prior to Admission medications   Medication Sig Start Date End Date Taking? Authorizing Provider  acetaminophen (TYLENOL) 500 MG tablet Take 1 tablet (500 mg total) by mouth every 6 (six) hours as needed. 02/11/18   Georgetta Haber, NP  amitriptyline (ELAVIL) 25 MG tablet Take by mouth 12.5 mg nightly for one week, then 25 mg nightly for one week, then 50 mg nightly 08/15/16   Dessa Phi, MD  aspirin 81 MG chewable tablet Chew by mouth daily.    [provider]  atenolol (TENORMIN) 50 MG tablet Take 1 tablet (50 mg total) by mouth daily. 10/08/18   Marcine Matar, MD  benzonatate (TESSALON) 200 MG capsule Take 1 capsule (200 mg total) by mouth 2 (two) times daily as needed for cough. 10/19/18   Eustace Moore, MD  dextromethorphan-guaiFENesin [facility] DM) 30-600 MG 12hr tablet Take 1 tablet by mouth 2 (two) times daily. 10/19/18   Eustace Moore, MD  fluticasone [facility]) 50 MCG/ACT nasal spray Place 2 sprays into both nostrils daily. 10/30/16   Anders Simmonds, PA-C  furosemide (LASIX) 40 MG tablet Take 1 tablet (40 mg total) by mouth daily. 04/02/18   Laural Benes,  Binnie Rail, MD  irbesartan (AVAPRO) 150 MG tablet Take 1 tablet (150 mg total) by mouth daily. 04/02/18   Marcine Matar, MD  potassium chloride (K-DUR) 10 MEQ tablet Take 1 tablet (10 mEq total) by mouth daily. 04/02/18   Marcine Matar, MD  SUMAtriptan (IMITREX) 25 MG tablet Take 1 tablet at the start of the headache PO.  May repeat in 2 hours if no improvement. 10/08/18   Marcine Matar, MD    Family History Family History  Problem Relation Age of Onset  . Heart disease Father        Dx early 75's (? heart valve)  . Heart attack Father 80  . Congestive Heart Failure Mother   . CAD Neg Hx   . Colon cancer Neg Hx   . Colon polyps Neg Hx   .  Esophageal cancer Neg Hx   . Stomach cancer Neg Hx   . Rectal cancer Neg Hx     Social History Social History   Tobacco Use  . Smoking status: Never Smoker  . Smokeless tobacco: Never Used  Substance Use Topics  . Alcohol use: No  . Drug use: No     Allergies   Patient has no known allergies.   Review of Systems Review of Systems  Constitutional: Negative for chills and fever.  HENT: Positive for ear pain and sinus pressure. Negative for sore throat.   Eyes: Negative for pain and visual disturbance.  Respiratory: Positive for cough and chest tightness. Negative for shortness of breath.   Cardiovascular: Negative for chest pain and palpitations.  Gastrointestinal: Negative for abdominal pain and vomiting.  Genitourinary: Negative for dysuria and hematuria.  Musculoskeletal: Positive for myalgias. Negative for arthralgias and back pain.  Skin: Negative for color change and rash.  Neurological: Negative for seizures and syncope.  All other systems reviewed and are negative.    Physical Exam Triage Vital Signs ED Triage Vitals [10/19/18 1336]  Enc Vitals Group     BP 138/86     Pulse Rate 82     Resp 18     Temp (!) 97.3 F (36.3 C)     Temp Source Temporal     SpO2 100 %     Weight      Height      Head Circumference      Peak Flow      Pain Score 8     Pain Loc      Pain Edu?      Excl. in GC?    No data found.  Updated Vital Signs BP 138/86 (BP Location: Right Arm)   Pulse 82   Temp (!) 97.3 F (36.3 C) (Temporal)   Resp 18   SpO2 100%    Physical Exam Constitutional:      General: She is not in acute distress.    Appearance: She is well-developed.  HENT:     Head: Normocephalic and atraumatic.     Right Ear: Tympanic membrane normal.     Left Ear: Tympanic membrane normal.     Mouth/Throat:     Pharynx: Posterior oropharyngeal erythema present.  Eyes:     Conjunctiva/sclera: Conjunctivae normal.     Pupils: Pupils are equal, round, and  reactive to light.  Neck:     Musculoskeletal: Normal range of motion.  Cardiovascular:     Rate and Rhythm: Normal rate and regular rhythm.     Pulses: Normal pulses.  Heart sounds: Normal heart sounds.  Pulmonary:     Effort: Pulmonary effort is normal. No respiratory distress.     Breath sounds: Normal breath sounds.  Abdominal:     General: There is no distension.     Palpations: Abdomen is soft.  Musculoskeletal: Normal range of motion.  Skin:    General: Skin is warm and dry.  Neurological:     General: No focal deficit present.     Mental Status: She is alert and oriented to person, place, and time. Mental status is at baseline.      UC Treatments / Results  Labs (all labs ordered are listed, but only abnormal results are displayed) Labs Reviewed - No data to display  EKG None  Radiology No results found.  Procedures Procedures (including critical care time)  Medications Ordered in UC Medications - No data to display  Initial Impression / Assessment and Plan / UC Course  I have reviewed the triage vital signs and the nursing notes.  Pertinent labs & imaging results that were available during my care of the patient were reviewed by me and considered in my medical decision making (see chart for details).     Reviewed with patient that she has viral upper respiratory infection.  Symptoms are not consistent with influenza virus.  Lungs are clear, no evidence of pneumonia.  Symptomatic care as discussed. Final Clinical Impressions(s) / UC Diagnoses   Final diagnoses:  Viral upper respiratory tract infection     Discharge Instructions     Get plenty of rest and stay well hydrated. Take Mucinex DM to help thin secretions Take tessalon for coughing. Continue tylenol or ibuprofen as needed for fever or pain.  Expect improvement in a few days    ED Prescriptions    Medication Sig Dispense Auth. Provider   benzonatate (TESSALON) 200 MG capsule Take 1  capsule (200 mg total) by mouth 2 (two) times daily as needed for cough. 20 capsule Eustace Moore, MD   dextromethorphan-guaiFENesin [facility] DM) 30-600 MG 12hr tablet Take 1 tablet by mouth 2 (two) times daily. 20 tablet Eustace Moore, MD     Controlled Substance Prescriptions  Controlled Substance Registry consulted? Not Applicable   Eustace Moore, MD 10/19/18 1517

## 2018-10-19 NOTE — Discharge Instructions (Addendum)
Get plenty of rest and stay well hydrated. Take Mucinex DM to help thin secretions Take tessalon for coughing. Continue tylenol or ibuprofen as needed for fever or pain.  Expect improvement in a few days

## 2018-10-19 NOTE — ED Triage Notes (Signed)
Pt sts productive cough and pain with cough

## 2018-10-26 ENCOUNTER — Encounter: Payer: Self-pay | Admitting: *Deleted

## 2018-10-28 ENCOUNTER — Telehealth: Payer: Self-pay

## 2018-10-28 NOTE — Telephone Encounter (Signed)
lpmtcb 3/26 

## 2018-10-29 ENCOUNTER — Telehealth: Payer: Self-pay

## 2018-10-29 NOTE — Telephone Encounter (Signed)
lpmtcb 3/27 

## 2018-11-01 ENCOUNTER — Telehealth: Payer: Self-pay

## 2018-11-01 NOTE — Telephone Encounter (Signed)
   Cardiac Questionnaire:    Since your last visit or hospitalization:    1. Have you been having new or worsening chest pain? no   2. Have you been having new or worsening shortness of breath? sometimes 3. Have you been having new or worsening leg swelling, wt gain, or increase in abdominal girth (pants fitting more tightly)? Wt gain concerns   4. Have you had any passing out spells? no    *A YES to any of these questions would result in the appointment being kept. *If all the answers to these questions are NO, we should indicate that given the current situation regarding the worldwide coronarvirus pandemic, at the recommendation of the CDC, we are looking to limit gatherings in our waiting area, and thus will reschedule their appointment beyond four weeks from today.   _____________

## 2018-11-01 NOTE — Telephone Encounter (Signed)
lpmtcb 3/30 

## 2018-11-01 NOTE — Telephone Encounter (Signed)
Pt does not want to do an e-visit. It has been too long since she was seen by a Dr, and would rather be seen in person.   Original call went to her Emergency Contact, which is her daughter. Please contact her at [phone]

## 2018-11-03 ENCOUNTER — Other Ambulatory Visit: Payer: Self-pay

## 2018-11-03 ENCOUNTER — Telehealth: Payer: Self-pay

## 2018-11-03 ENCOUNTER — Telehealth (INDEPENDENT_AMBULATORY_CARE_PROVIDER_SITE_OTHER): Payer: BLUE CROSS/BLUE SHIELD | Admitting: Cardiology

## 2018-11-03 VITALS — BP 142/76 | HR 70 | Ht 66.0 in | Wt 241.0 lb

## 2018-11-03 DIAGNOSIS — Z79899 Other long term (current) drug therapy: Secondary | ICD-10-CM

## 2018-11-03 DIAGNOSIS — I110 Hypertensive heart disease with heart failure: Secondary | ICD-10-CM | POA: Diagnosis not present

## 2018-11-03 DIAGNOSIS — I5033 Acute on chronic diastolic (congestive) heart failure: Secondary | ICD-10-CM | POA: Diagnosis not present

## 2018-11-03 MED ORDER — POTASSIUM CHLORIDE ER 10 MEQ PO TBCR: 20.0000 meq | EXTENDED_RELEASE_TABLET | Freq: Every day | ORAL | 3 refills | Status: DC

## 2018-11-03 MED ORDER — FUROSEMIDE 40 MG PO TABS: 80.0000 mg | ORAL_TABLET | Freq: Every day | ORAL | 3 refills | Status: DC

## 2018-11-03 NOTE — Telephone Encounter (Signed)
Roomed patient and confirmed verbal consent for Phone Virtual Visit. 4/1

## 2018-11-03 NOTE — Patient Instructions (Signed)
Medication Instructions:  INCREASE LASIX TO 80 mg DAILY INCREASE POTASSIUM TO 20 meq DAILY If you need a refill on your cardiac medications before your next appointment, please call your pharmacy.   Lab work:4/8 Wednesday BNP BMP If you have labs (blood work) drawn today and your tests are completely normal, you will receive your results only by: [name] MyChart Message (if you have MyChart) OR . A paper copy in the mail If you have any lab test that is abnormal or we need to change your treatment, we will call you to review the results.  Testing/Procedures: NONE  Follow-Up: 3 MONTHS WITH Monique Aguilar At [facility], you and your health needs are our priority.  As part of our continuing mission to provide you with exceptional heart care, we have created designated Provider Care Teams.  These Care Teams include your primary Cardiologist (physician) and Advanced Practice Providers (APPs -  Physician Assistants and Nurse Practitioners) who all work together to provide you with the care you need, when you need it.  Any Other Special Instructions Will Be Listed Below (If Applicable).

## 2018-11-03 NOTE — Progress Notes (Signed)
Virtual Visit via Telephone Note    Evaluation Performed:  Follow-up visit  This visit type was conducted due to national recommendations for restrictions regarding the COVID-19 Pandemic (e.g. social distancing).  This format is felt to be most appropriate for this patient at this time.  All issues noted in this document were discussed and addressed.  No physical exam was performed (except for noted visual exam findings with Video Visits).  Please refer to the patient's chart (MyChart message for video visits and phone note for telephone visits) for the patient's consent to telehealth for [facility].  Date:  11/03/2018   ID:  Monique Aguilar, DOB Oct 26, 1965, MRN [id number]  Patient Location:  Home  Provider location:   Office [facility] [address] [place], [place]  PCP:  Marcine Matar, MD  Cardiologist:  Tobias Alexander, MD  Electrophysiologist:  None   Chief Complaint: Dyspnea and weight gain  History of Present Illness:    Monique Aguilar is a 53 y.o. female who presents via audio/video conferencing for a telehealth visit today.    The patient does not have symptoms concerning for COVID-19 infection (fever, chills, cough, or new shortness of breath).   Monique Aguilar is a 53 y.o. 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 Cornelius Moras), chronic diastolic CHF, HTN, and obesity.  She is followed by Dr. Delton See.  She was lost to follow-up for period of time and not seen since August 2017.  To further summarize her history, she has a hx of atypical chest pain and dyspnea. 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 The patient was advised to undergo an exercise treadmill stress test to assess for exertional ischemia from systolic inter-arterial compression. This was  performed and showed no signs of ischemia. She was referred to Dr. Cornelius Moras for surgical assessment.   She was seen by Dr. Cornelius Moras in 05/2015. He wrote: "The presence of anomalous right coronary artery arising off of the left sinus of Valsalva and has been associated with history of acute myocardial infarction and sudden cardiac death in anecdotal case reports, but to a far lesser degree than patients with anomalous left coronary artery. Surgical options include "unroofing" of the proximal segment of the right coronary artery and coronary artery bypass grafting, but efficacy of these procedures has never been clearly demonstrated. I would be very cautious to suggest that surgical intervention might have a high likelihood to improve the patient's symptoms of congestive heart failure."  He also noted that she had a previous exercise nuclear stress test with no ischemia and formal pulmonary exercise testing with gas exchange that demonstrated a normal functional capacity. The patient appeared to be limited primarily by her obesity and related ventilatory limitation. There was no evidence for myocardial ischemia with exercise. He favored weight loss and continued monitoring at that time.   She has required diuretics, Lasix for her diastolic heart failure. She is also on a  blocker and ARB. As noted above she was lost to follow-up for period of time.    Monique Aguilar arranged today's follow-up given new concerns regarding weight gain, edema, dyspnea and poor response to current Lasix regimen.  She typically takes 40 mg of Lasix daily but over the past month she has noticed  decreased urinary response.  She is also noted significant weight gain from 133 pounds to 241 pounds over the last 30 days.  This is also in the setting of dietary changes, for the good which includes elimination of soft drinks and reduction of salt intake.  She is also noticed swelling around her legs and ankles and increased abdominal girth.  She  has increased to pant size from large extra-large over the past 30 days.  She also notes some mild exertional dyspnea as well as new two-pillow orthopnea.  No PND.  She reports full medication compliance.  Blood pressure today is 142/76.  No exertional chest pain.    Prior CV studies:   The following studies were reviewed today:  CPX 04/2015 Conclusion: Exercise testing with gas exchange demonstrates a normal functional capacity when compared to matched sedentary norms. At peak exercise patient is limited by her obesity and related ventilatory limitation. There is no obvious cardiac limitation though the elevated VE/VCO2 slope can be suggestive of diastolic dysfunction and/or possible pulmonary HTN. Exercise ECG did not show any signs of ischemia.  2D ECHO: 04/19/2015 LV EF: 55% - 60% Study Conclusions - Left ventricle: The cavity size was normal. Wall thickness was normal. Systolic function was normal. The estimated ejection fraction was in the range of 55% to 60%. Wall motion was normal; there were no regional wall motion abnormalities. Left ventricular diastolic function parameters were normal.  Cardiac CT 07/2014 IMPRESSION: 1. Coronary calcium score of 0. This was 0 percentile for age and sex matched control. 2. 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 Systolic inter-arterial compression can't be excluded on this study. 3. Right dominance. No evidence of CAD. An exercise treadmill stress test will be scheduled to evaluate for exertional ischemia - ECG changes and symptoms suggestive of systolic inter-arterial compression.  Past Medical History:  Diagnosis Date  . Abnormal CT of the chest    a. 10/2013: 10mm RML pulm nodule, axillary lymphadenopathy. Instructed to f/u PCP.  [name] Anomalous right coronary artery   . Atypical chest pain    a. 10/2013:  suspected GI etiology. Nuc - nonischemic, EF 39% but visually higher - f/u echo to clarify showed EF 50-55% with only mild LVH, no significant valvuar disease.  . Chronic diastolic congestive heart failure (HCC)   . HTN (hypertension)   . Low back pain   . Microcytic anemia    a. No prior workup.  [name] Migraines    Past Surgical History:  Procedure Laterality Date  . BREAST CYST ASPIRATION    . CESAREAN SECTION  1988  . TONSILLECTOMY  1992?  . TUBAL LIGATION  1995  . WISDOM TOOTH EXTRACTION  1995     Current Meds  Medication Sig  . acetaminophen (TYLENOL) 500 MG tablet Take 1 tablet (500 mg total) by mouth every 6 (six) hours as needed.  [name] atenolol (TENORMIN) 50 MG tablet Take 1 tablet (50 mg total) by mouth daily.  . furosemide (LASIX) 40 MG tablet Take 1 tablet (40 mg total) by mouth daily.  . irbesartan (AVAPRO) 150 MG tablet Take 1 tablet (150 mg total) by mouth daily.  . potassium chloride (K-DUR) 10 MEQ tablet Take 1 tablet (10 mEq total) by mouth daily.  . SUMAtriptan (IMITREX) 25 MG tablet Take 1 tablet at the start of the headache PO.  May repeat in 2 hours if no improvement.  Allergies:   Fruit & vegetable daily [nutritional supplements] and Grass extracts [gramineae pollens]   Social History   Tobacco Use  . Smoking status: Never Smoker  . Smokeless tobacco: Never Used  Substance Use Topics  . Alcohol use: No  . Drug use: No     Family Hx: The patient's family history includes Congestive Heart Failure in her mother; Heart attack (age of onset: 92) in her father; Heart disease in her father. There is no history of CAD, Colon cancer, Colon polyps, Esophageal cancer, Stomach cancer, or Rectal cancer.  ROS:   Please see the history of present illness.     All other systems reviewed and are negative.   Labs/Other Tests and Data Reviewed:    Recent Labs: 04/02/2018: ALT 16; Hemoglobin 11.2; Platelets 318 10/08/2018: BUN 13; Creatinine, Ser 1.09; Potassium 4.5;  Sodium 141   Recent Lipid Panel Lab Results  Component Value Date/Time   CHOL 180 04/02/2018 04:01 PM   TRIG 94 04/02/2018 04:01 PM   HDL 60 04/02/2018 04:01 PM   CHOLHDL 3.0 04/02/2018 04:01 PM   CHOLHDL 3.1 10/11/2013 01:25 AM   LDLCALC 101 (H) 04/02/2018 04:01 PM    Wt Readings from Last 3 Encounters:  11/03/18 241 lb (109.3 kg)  10/08/18 243 lb 6.4 oz (110.4 kg)  05/21/18 231 lb 6.4 oz (105 kg)     Objective:    Vital Signs:  BP (!) 142/76   Pulse 70   Ht  (1.676 m)   Wt 241 lb (109.3 kg)   BMI 38.90 kg/m    Well sounding female in no acute distress. Speaking in clear complete sentences.  Speech unlabored. Breathing unlabored.  ASSESSMENT & PLAN:    1.  Acute on chronic diastolic CHF: Symptoms are consistent with acute on chronic CHF including weight gain, increased abdominal girth, lower extremity edema and orthopnea.  She is comfortable at rest.  She denies any associated chest pain.  No PND.  She is speaking in clear complete sentences.  Speech and breathing both unlabored.  She notes poor urinary response with 40 mg of Lasix once daily.  She was recently seen by her PCP less than 1 month ago on 10/08/2018 and had a basic metabolic panel which showed normal serum creatinine and normal potassium.  We will further increase her Lasix dose to 80 mg daily and will also increase her supplemental potassium dose, we will double K. Dur from 10 to 20 mEq daily and will have her present for labs in 7 days to recheck renal function and potassium levels to ensure that they remain stable after the increase in diuretic regimen.  Patient was also instructed to monitor her weight closely and to notify us if her symptoms worsen instead of improve.  Patient also advised to adhere to a low-sodium diet.  When we notify patient of lab results in 7 days we will reassess symptom response at that time.  If she is not better then we will arrange another follow-up visit visit.  If patient symptoms  and edema have improved and if renal function potassium levels are stable then we will plan to follow-up with her again in 3 months.  2.  Hypertension: Mildly elevated at 142/76.  This is also in the setting of volume overload.  We will plan to increase Lasix to 80 mg daily as noted above.  This should help with blood pressure.  Patient will continue to monitor at home.  She will continue on  current dose of beta-blocker and ARB.  3. 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  Stress test negative for ischemia.  Patient was evaluated by Dr. Cornelius Moras.  He recommended medical therapy.  No indication for surgery.  She denies anginal symptomatology.  COVID-19 Education: The signs and symptoms of COVID-19 were discussed with the patient and how to seek care for testing (follow up with PCP or arrange E-visit).  The importance of social distancing was discussed today.  Patient Risk:   After full review of this patient's clinical status, I feel that they are at least moderate risk at this time.  Time:   Today, I have spent 25 minutes with the patient with telehealth technology discussing diastolic heart failure, hypertension and medication management.     Medication Adjustments/Labs and Tests Ordered: Current medicines are reviewed at length with the patient today.  Concerns regarding medicines are outlined above.  Tests Ordered:   Labs: BMP and BNP in 7 days for diastolic heart failure and medication monitoring  Medication Changes:   Increase Lasix to 80 mg once daily  Increase potassium chloride to 20 mEq daily  Disposition:  Follow up in 1 week for labs. FU in 3 months for OV if good response to diuretic change (place recall for [employer] PA-C in 3 months). If not better after lasix increase in 1 week, then we may arrange f/u in clinic w/ DOD in 1-2 weeks (this will be determined after labs in 7 days)   Signed, Robbie Lis, PA-C   11/03/2018 7:55 AM    [facility]

## 2018-11-04 ENCOUNTER — Ambulatory Visit: Payer: BLUE CROSS/BLUE SHIELD | Admitting: Registered"

## 2018-11-10 ENCOUNTER — Other Ambulatory Visit: Payer: BLUE CROSS/BLUE SHIELD

## 2018-11-10 ENCOUNTER — Other Ambulatory Visit: Payer: Self-pay

## 2018-11-10 DIAGNOSIS — I5033 Acute on chronic diastolic (congestive) heart failure: Secondary | ICD-10-CM

## 2018-11-10 DIAGNOSIS — Z79899 Other long term (current) drug therapy: Secondary | ICD-10-CM

## 2018-11-11 LAB — BASIC METABOLIC PANEL
BUN/Creatinine Ratio: 13 (ref 9–23)
BUN: 15 mg/dL (ref 6–24)
CO2: 24 mmol/L (ref 20–29)
Calcium: 9.4 mg/dL (ref 8.7–10.2)
Chloride: 104 mmol/L (ref 96–106)
Creatinine, Ser: 1.17 mg/dL — ABNORMAL HIGH (ref 0.57–1.00)
GFR calc Af Amer: 61 mL/min/{1.73_m2} (ref >59)
GFR calc non Af Amer: 53 mL/min/{1.73_m2} — ABNORMAL LOW (ref >59)
Glucose: 85 mg/dL (ref 65–99)
Potassium: 4.3 mmol/L (ref 3.5–5.2)
Sodium: 142 mmol/L (ref 134–144)

## 2018-11-11 LAB — PRO B NATRIURETIC PEPTIDE: NT-Pro BNP: 51 pg/mL (ref 0–249)

## 2018-11-12 ENCOUNTER — Telehealth: Payer: Self-pay

## 2018-11-12 NOTE — Telephone Encounter (Signed)
-----   Message from [place], [place] sent at 11/12/2018  9:24 AM EDT ----- Renal function and potassium levels are good and in normal range after medication change. Please notify pt and check symptom response. See if she is feeling better after increasing lasix to 80 mg daily and see if urine output has increased, breathing improved, weight and swelling improved? If she is responding better with 80 mg of lasix, she should continue this dose as well as 20 mEq of Kdur. F/u in August. I can see her back in clinic or Dr. Delton See.

## 2018-11-12 NOTE — Telephone Encounter (Signed)
Notes recorded by Sigurd Sos, RN on 11/12/2018 at 9:44 AM EDT The patient has been notified of the result and verbalized understanding. The patient is feeling much better and I informed her that we would f/u in August. All questions (if any) were answered. Sigurd Sos, RN 11/12/2018 9:44 AM

## 2018-11-22 MED FILL — FUROSEMIDE 40 MG TAB: 40 | 30 days supply | Qty: 60 | Fill #0

## 2018-11-23 ENCOUNTER — Telehealth: Payer: Self-pay | Admitting: Pharmacist

## 2018-11-23 MED ORDER — LISINOPRIL 10 MG PO TABS: 10.0000 mg | ORAL_TABLET | Freq: Every day | ORAL | 3 refills | Status: DC

## 2018-11-23 MED FILL — LISINOPRIL 10 MG TABS: 10 | 30 days supply | Qty: 30 | Fill #0

## 2018-11-23 NOTE — Telephone Encounter (Signed)
Substituted with lisinopril 

## 2018-11-23 NOTE — Telephone Encounter (Signed)
Received notice from pharmacy that irbesartan is on backorder. Pt's PCP is out of clinic for the week - will send to Dr. Alvis Lemmings for consideration of losartan.

## 2018-11-23 NOTE — Addendum Note (Signed)
Addended byHoy Register on: 11/23/2018 01:25 PM   Modules accepted: Orders

## 2018-11-26 ENCOUNTER — Other Ambulatory Visit: Payer: Self-pay

## 2018-11-26 ENCOUNTER — Ambulatory Visit: Payer: BLUE CROSS/BLUE SHIELD | Admitting: Allergy

## 2018-11-26 ENCOUNTER — Encounter: Payer: Self-pay | Admitting: Allergy

## 2018-11-26 VITALS — BP 138/88 | HR 84 | Temp 98.8°F | Resp 16 | Ht 65.0 in | Wt 243.4 lb

## 2018-11-26 DIAGNOSIS — L500 Allergic urticaria: Secondary | ICD-10-CM | POA: Diagnosis not present

## 2018-11-26 DIAGNOSIS — T781XXD Other adverse food reactions, not elsewhere classified, subsequent encounter: Secondary | ICD-10-CM | POA: Diagnosis not present

## 2018-11-26 DIAGNOSIS — T783XXD Angioneurotic edema, subsequent encounter: Secondary | ICD-10-CM

## 2018-11-26 MED ORDER — EPINEPHRINE 0.3 MG/0.3ML IJ SOAJ: INTRAMUSCULAR | 3 refills | Status: DC

## 2018-11-26 NOTE — Patient Instructions (Addendum)
Adverse food reaction  - select food allergy skin testing is positive to almond.   Negative to grape, orange, banana, apple, peach, strawberry, cantaloupe, watermelon, pineapple.    - will obtain serum IgE levels to the fruits above + mango  - continue avoidance of almonds and fruits until labs return  - have access to self-injectable epinephrine (Epipen or AuviQ) 0.3mg  at all times  - follow emergency action plan in case of allergic reaction  Environmental allergy - itching, hives and swelling  - environmental allergy skin testing is positive to grass pollens, weed pollens, tree pollens  - allergen avoidance measures discussed/handouts provided  - continue Zyrtec 10mg  daily as needed (may take additional daily dose if needed)  Oral allergy syndrome  - you may have experienced oral allergy syndrome or pollen food allergy syndrome in regards to fruit ingestion.   - oral allergy syndrome (OAS) or pollen-food allergy syndrome (PFAS) is a relatively common form of food allergy, particularly in adults. It typically occurs in people who have pollen allergies when the immune system "sees" proteins on the food that look like proteins on the pollen. This results in the allergy antibody (IgE) binding to the food instead of the pollen. Patients typically report itching and/or mild swelling of the mouth and throat immediately following ingestion of certain uncooked fruits (including nuts) or raw vegetables. Only a very small number of affected individuals experience systemic allergic reactions, such as anaphylaxis which occurs with true food allergies.       Follow-up 6-12 months or sooner if needed

## 2018-11-26 NOTE — Progress Notes (Signed)
New Patient Note  RE: Monique Aguilar MRN: [id number] DOB: 02-21-1966 Date of Office Visit: 11/26/2018  Referring provider: Marcine Matar, MD Primary care provider: Marcine Matar, MD  Chief Complaint: reactions to fruits  History of present illness: Monique Aguilar is a 53 y.o. female presenting today for consultation for food allergy.    She states she tried a strawberry once around age 11 and developed eyes swelling and her throat swelled up with difficulty breathing.  She states she needed a shot to treat her.  At 53 yo she tried strawberries again and she reports having a similar reaction.  She tried an apple "just to see" and states she had a similar reaction as the strawberry.  This was around 53 yo.  She states her lips swelling with almonds.  She can eat peanuts, cashew without issue.  She states she can eat tomatoes without issue.   She has been avoiding all fruits.    She states she is allergic to cats and pollens as she states she will develop itching, hives and swelling.  She states she keeps zyrtec with her at all times.   No history eczema or asthma.    Review of systems: Review of Systems  Constitutional: Negative for chills, fever and malaise/fatigue.  HENT: Negative for congestion, ear discharge, nosebleeds and sore throat.   Eyes: Negative for pain, discharge and redness.  Respiratory: Negative for cough, shortness of breath and wheezing.   Cardiovascular: Negative for chest pain.  Gastrointestinal: Negative for abdominal pain, constipation, diarrhea, heartburn, nausea and vomiting.  Musculoskeletal: Negative for joint pain.  Skin: Negative for itching and rash.  Neurological: Negative for headaches.    All other systems negative unless noted above in HPI  Past medical history: Past Medical History:  Diagnosis Date  . Abnormal CT of the chest    a. 10/2013: 4mm RML pulm nodule, axillary lymphadenopathy. Instructed to f/u PCP.  [name] Anomalous right  coronary artery   . Atypical chest pain    a. 10/2013: suspected GI etiology. Nuc - nonischemic, EF 39% but visually higher - f/u echo to clarify showed EF 50-55% with only mild LVH, no significant valvuar disease.  . Chronic diastolic congestive heart failure (HCC)   . HTN (hypertension)   . Low back pain   . Microcytic anemia    a. No prior workup.  . Migraines     Past surgical history: Past Surgical History:  Procedure Laterality Date  . BREAST CYST ASPIRATION    . CESAREAN SECTION  1988  . TONSILLECTOMY  1992?  . TUBAL LIGATION  1995  . WISDOM TOOTH EXTRACTION  1995    Family history:  Family History  Problem Relation Age of Onset  . Heart disease Father        Dx early 34's (? heart valve)  . Heart attack Father 35  . Congestive Heart Failure Mother   . CAD Neg Hx   . Colon cancer Neg Hx   . Colon polyps Neg Hx   . Esophageal cancer Neg Hx   . Stomach cancer Neg Hx   . Rectal cancer Neg Hx     Social history: Lives in an apartment with carpeting with electric heating and central cooling.  No pets in the home.  No concern for water damage, mildew or roaches in the home.  She is a [employer]/C[employer].  Denies smoking history.    Medication List: Allergies as of 11/26/2018  Reactions   Fruit & Vegetable Daily [nutritional Supplements] Anaphylaxis   Strawberries or other fruit   Grass Extracts [gramineae Pollens] Other (See Comments)   Watery eyes, sneezing      Medication List       Accurate as of November 26, 2018  4:10 PM. Always use your most recent med list.        acetaminophen 500 MG tablet Commonly known as:  TYLENOL Take 1 tablet (500 mg total) by mouth every 6 (six) hours as needed.   atenolol 50 MG tablet Commonly known as:  TENORMIN Take 1 tablet (50 mg total) by mouth daily.   furosemide 40 MG tablet Commonly known as:  LASIX Take 2 tablets (80 mg total) by mouth daily.   lisinopril 10 MG tablet Commonly known as:  ZESTRIL Take 1 tablet (10  mg total) by mouth daily.   potassium chloride 10 MEQ tablet Commonly known as:  K-DUR Take 2 tablets (20 mEq total) by mouth daily.   SUMAtriptan 25 MG tablet Commonly known as:  Imitrex Take 1 tablet at the start of the headache PO.  May repeat in 2 hours if no improvement.       Known medication allergies: Allergies  Allergen Reactions  . Fruit & Vegetable Daily [Nutritional Supplements] Anaphylaxis    Strawberries or other fruit  . Grass Extracts [Gramineae Pollens] Other (See Comments)    Watery eyes, sneezing     Physical examination: Blood pressure 138/88, pulse 84, temperature 98.8 F (37.1 C), temperature source Oral, resp. rate 16, height 5\' 5"  (1.651 m), weight 243 lb 6.4 oz (110.4 kg), SpO2 97 %.  General: Alert, interactive, in no acute distress. HEENT: PERRLA, TMs pearly gray, turbinates non-edematous without discharge, post-pharynx non erythematous. Neck: Supple without lymphadenopathy. Lungs: Clear to auscultation without wheezing, rhonchi or rales. {no increased work of breathing. CV: Normal S1, S2 without murmurs. Abdomen: Nondistended, nontender. Skin: Warm and dry, without lesions or rashes. Extremities:  No clubbing, cyanosis or edema. Neuro:   Grossly intact.  Diagnositics/Labs:  Allergy testing: environmental allergy skin prick testing is positive to grasses, weeds, tree pollens. Food allergy skin prick testing is positive to almond.  Negative to all fruits.  Allergy testing results were read and interpreted by provider, documented by clinical staff.   Assessment and plan:   Adverse food reaction  - select food allergy skin testing is positive to almond.   Negative to grape, orange, banana, apple, peach, strawberry, cantaloupe, watermelon, pineapple.    - will obtain serum IgE levels to the fruits above + mango  - continue avoidance of almonds and fruits until labs return  - have access to self-injectable epinephrine (Epipen or AuviQ) 0.3mg  at  all times  - follow emergency action plan in case of allergic reaction  Environmental allergy - itching, hives and swelling  - environmental allergy skin testing is positive to grass pollens, weed pollens, tree pollens  - allergen avoidance measures discussed/handouts provided  - continue Zyrtec 10mg  daily as needed (may take additional daily dose if needed)  Oral allergy syndrome  - you may have experienced oral allergy syndrome or pollen food allergy syndrome in regards to fruit ingestion.   - oral allergy syndrome (OAS) or pollen-food allergy syndrome (PFAS) is a relatively common form of food allergy, particularly in adults. It typically occurs in people who have pollen allergies when the immune system "sees" proteins on the food that look like proteins on the pollen. This results in the  allergy antibody (IgE) binding to the food instead of the pollen. Patients typically report itching and/or mild swelling of the mouth and throat immediately following ingestion of certain uncooked fruits (including nuts) or raw vegetables. Only a very small number of affected individuals experience systemic allergic reactions, such as anaphylaxis which occurs with true food allergies.    Follow-up 6-12 months or sooner if needed  I appreciate the opportunity to take part in Monique Aguilar's care. Please do not hesitate to contact me with questions.  Sincerely,   Margo Aye, MD Allergy/Immunology [facility]

## 2018-11-28 LAB — ALLERGEN, ORANGE F33: Orange: <0.1 kU/L

## 2018-11-28 LAB — ALLERGEN, PINEAPPLE, F210: Pineapple IgE: <0.1 kU/L

## 2018-11-28 LAB — ALLERGEN CANTALOUPE: Allergen Melon IgE: <0.1 kU/L

## 2018-11-28 LAB — ALLERGEN BANANA: Allergen Banana IgE: <0.1 kU/L

## 2018-11-28 LAB — ALLERGEN, APPLE F49: Allergen Apple, IgE: 1.31 kU/L — AB

## 2018-11-28 LAB — ALLERGEN WATERMELON: Allergen Watermelon IgE: <0.1 kU/L

## 2018-11-28 LAB — ALLERGEN GRAPE F259: Allergen Grape IgE: <0.1 kU/L

## 2018-11-28 LAB — ALLERGEN, MANGO, F91: Mango IgE: <0.1 kU/L

## 2018-11-28 LAB — ALLERGEN, STRAWBERRY, F44: Allergen Strawberry IgE: <0.1 kU/L

## 2018-11-28 LAB — ALLERGEN PEACH F95: Allergen, Peach f95: 3.39 kU/L — AB

## 2018-12-11 ENCOUNTER — Encounter: Payer: Self-pay | Admitting: Internal Medicine

## 2018-12-12 ENCOUNTER — Encounter: Payer: Self-pay | Admitting: Internal Medicine

## 2018-12-13 ENCOUNTER — Ambulatory Visit: Payer: BLUE CROSS/BLUE SHIELD | Attending: Internal Medicine | Admitting: Internal Medicine

## 2018-12-14 ENCOUNTER — Ambulatory Visit: Payer: BLUE CROSS/BLUE SHIELD | Attending: Internal Medicine | Admitting: Internal Medicine

## 2018-12-14 ENCOUNTER — Encounter: Payer: Self-pay | Admitting: Internal Medicine

## 2018-12-14 ENCOUNTER — Other Ambulatory Visit: Payer: Self-pay

## 2018-12-14 ENCOUNTER — Ambulatory Visit: Payer: BLUE CROSS/BLUE SHIELD | Admitting: Internal Medicine

## 2018-12-14 DIAGNOSIS — N3000 Acute cystitis without hematuria: Secondary | ICD-10-CM

## 2018-12-14 MED ORDER — SULFAMETHOXAZOLE-TRIMETHOPRIM 400-80 MG PO TABS: 1.0000 | ORAL_TABLET | Freq: Two times a day (BID) | ORAL | 0 refills | Status: DC

## 2018-12-14 NOTE — Progress Notes (Signed)
Virtual Visit via Telephone Note Due to current restrictions/limitations of in-office visits due to the COVID-19 pandemic, this scheduled clinical appointment was converted to a telehealth visit  I connected with Monique Aguilar on 12/14/18 at 9:35 am by telephone and verified that I am speaking with the correct person using two identifiers. I am in my office.  The patient is at home.  Only the patient and myself participated in this encounter.  I discussed the limitations, risks, security and privacy concerns of performing an evaluation and management service by telephone and the availability of in person appointments. I also discussed with the patient that there may be a patient responsible charge related to this service. The patient expressed understanding and agreed to proceed.   History of Present Illness: Pt with hx of HTN, dCHF, anemia, migraines, obesity, HL, fhx of CAD  Patient complains of burning and pain with urination times a few days.  She denies any fever.  No hematuria but she has noticed that the urine is dark.  She has had some lower back pain.  Symptoms started after she started taking some different vitamins including vitamin A, probiotic and cranberry pills to help improve her skin.   Observations/Objective: No direct observation done as this was a telephone encounter  Assessment and Plan: Acute cystitis without hematuria Started patient on Bactrim twice a day for 5 days. I do not think that the vitamins caused her symptoms.  However I have discouraged using all of these different vitamins.  I told patient that if she would like to take a vitamin it is okay to take a One-A-Day women's vitamin  Follow Up Instructions: As needed if no improvement   I discussed the assessment and treatment plan with the patient. The patient was provided an opportunity to ask questions and all were answered. The patient agreed with the plan and demonstrated an understanding of the  instructions.   The patient was advised to call back or seek an in-person evaluation if the symptoms worsen or if the condition fails to improve as anticipated.  I provided 5 minutes of non-face-to-face time during this encounter.   Jonah Blue, MD

## 2019-01-14 ENCOUNTER — Other Ambulatory Visit: Payer: Self-pay

## 2019-01-14 ENCOUNTER — Ambulatory Visit: Payer: BC Managed Care – PPO | Attending: Internal Medicine | Admitting: Internal Medicine

## 2019-01-14 ENCOUNTER — Encounter: Payer: Self-pay | Admitting: Internal Medicine

## 2019-01-14 DIAGNOSIS — Z6839 Body mass index (BMI) 39.0-39.9, adult: Secondary | ICD-10-CM

## 2019-01-14 DIAGNOSIS — R300 Dysuria: Secondary | ICD-10-CM | POA: Diagnosis not present

## 2019-01-14 DIAGNOSIS — D649 Anemia, unspecified: Secondary | ICD-10-CM

## 2019-01-14 DIAGNOSIS — I5032 Chronic diastolic (congestive) heart failure: Secondary | ICD-10-CM | POA: Diagnosis not present

## 2019-01-14 DIAGNOSIS — I10 Essential (primary) hypertension: Secondary | ICD-10-CM

## 2019-01-14 DIAGNOSIS — E669 Obesity, unspecified: Secondary | ICD-10-CM

## 2019-01-14 DIAGNOSIS — Z91018 Allergy to other foods: Secondary | ICD-10-CM

## 2019-01-14 MED ORDER — CARVEDILOL 6.25 MG PO TABS: 6.2500 mg | ORAL_TABLET | Freq: Two times a day (BID) | ORAL | 6 refills | Status: DC

## 2019-01-14 NOTE — Progress Notes (Signed)
Virtual Visit via Telephone Note Due to current restrictions/limitations of in-office visits due to the COVID-19 pandemic, this scheduled clinical appointment was converted to a telehealth visit  I connected with Monique Aguilar on 01/14/19 at 3:17 p.m by telephone and verified that I am speaking with the correct person using two identifiers. I am in my office.  The patient is at home.  Only the patient and myself participated in this encounter.  I discussed the limitations, risks, security and privacy concerns of performing an evaluation and management service by telephone and the availability of in person appointments. I also discussed with the patient that there may be a patient responsible charge related to this service. The patient expressed understanding and agreed to proceed.   History of Present Illness: Pt with hx of HTN, dCHF, anomalous RCA, anemia, migraines, obesity, HL, fhx of CAD   Treated for UTI symptoms 1 month ago.  Since completing abx she reports intermittent dark urine, burning and slight smell. No fever.  She feels her pH is off.  No vaginal dischg.  Food Allergy:  Saw allergist.  She was found to be allergic to almonds, apple and peaches.   HTN/dCHF:  Had tele-visit with cardiology 11/2018.  Checks BP daily. Last BP 154/78.  Lowest SBP was 138 No CP/SOB/LE edema/PND Compliant with lisinopril and atenolol.  She limits salt in the foods.  Obesity:  Has loss 1 lb.  Walk on wkends for exercise Doing a lot better with eating habits.  Cut out sugary snacks and eating fruits instead.  Cut back on eating pork.  No sodas.  Drinking mainly water  Observations/Objective:   Chemistry      Component Value Date/Time   NA 142 11/10/2018 1454   K 4.3 11/10/2018 1454   CL 104 11/10/2018 1454   CO2 24 11/10/2018 1454   BUN 15 11/10/2018 1454   CREATININE 1.17 (H) 11/10/2018 1454   CREATININE 1.05 08/15/2016 1424      Component Value Date/Time   CALCIUM 9.4 11/10/2018 1454   ALKPHOS 84 04/02/2018 1601   AST 19 04/02/2018 1601   ALT 16 04/02/2018 1601   BILITOT <0.2 04/02/2018 1601      Lab Results  Component Value Date   WBC 4.7 04/02/2018   HGB 11.2 04/02/2018   HCT 34.4 04/02/2018   MCV 78 (L) 04/02/2018   PLT 318 04/02/2018    Assessment and Plan: 1. Essential hypertension Home blood pressure readings not at goal.  We will change atenolol to carvedilol for better blood pressure lowering effect.  Advised patient to continue monitoring her blood pressure.  Goal is 130/80 or lower.  If she is running higher on the new medication she will let me know so we can adjust dose - carvedilol (COREG) 6.25 MG tablet; Take 1 tablet (6.25 mg total) by mouth 2 (two) times daily with a meal.  Dispense: 60 tablet; Refill: 6  2. CHF (congestive heart failure), NYHA class I, chronic, diastolic (HCC) Clinically stable.  3. Obesity (BMI 35.0-39.9 without comorbidity) Amended her on trying to improve her eating habits.  Dietary counseling given.  Encouraged her to try to exercise as much as she is able to at least 3-4 times a week for 30 minutes  4. Food allergy Currently seeing an allergist.  He has found out which foods she is allergic to  5. Dysuria We will have her come to the lab to give a urine sample - Urinalysis; Future - Urine Culture; Future  6. Anemia, unspecified type - CBC; Future  Follow Up Instructions: F/u in 3 mths.  When she comes to the lab on Monday she will stop at the front desk to schedule an appointment for her Pap smear   I discussed the assessment and treatment plan with the patient. The patient was provided an opportunity to ask questions and all were answered. The patient agreed with the plan and demonstrated an understanding of the instructions.   The patient was advised to call back or seek an in-person evaluation if the symptoms worsen or if the condition fails to improve as anticipated.  I provided 16 minutes of non-face-to-face  time during this encounter.   Jonah Blueeborah , MD

## 2019-01-14 NOTE — Progress Notes (Signed)
Pt states she is still having a little bit of burning while urinating  Pt states her ph balance is off and is requesting something

## 2019-01-17 ENCOUNTER — Other Ambulatory Visit: Payer: BC Managed Care – PPO

## 2019-01-18 ENCOUNTER — Other Ambulatory Visit: Payer: Self-pay

## 2019-01-18 ENCOUNTER — Ambulatory Visit: Payer: BC Managed Care – PPO | Attending: Internal Medicine

## 2019-01-18 DIAGNOSIS — D649 Anemia, unspecified: Secondary | ICD-10-CM | POA: Diagnosis not present

## 2019-01-18 DIAGNOSIS — R300 Dysuria: Secondary | ICD-10-CM | POA: Diagnosis not present

## 2019-01-19 LAB — URINALYSIS
Bilirubin, UA: NEGATIVE
Glucose, UA: NEGATIVE
Ketones, UA: NEGATIVE
Leukocytes,UA: NEGATIVE
Nitrite, UA: NEGATIVE
Protein,UA: NEGATIVE
RBC, UA: NEGATIVE
Specific Gravity, UA: 1.021 (ref 1.005–1.030)
Urobilinogen, Ur: 0.2 mg/dL (ref 0.2–1.0)
pH, UA: 5.5 (ref 5.0–7.5)

## 2019-01-19 LAB — CBC
Hematocrit: 34.2 % (ref 34.0–46.6)
Hemoglobin: 11.9 g/dL (ref 11.1–15.9)
MCH: 27.5 pg (ref 26.6–33.0)
MCHC: 34.8 g/dL (ref 31.5–35.7)
MCV: 79 fL (ref 79–97)
Platelets: 319 10*3/uL (ref 150–450)
RBC: 4.33 x10E6/uL (ref 3.77–5.28)
RDW: 14.2 % (ref 11.7–15.4)
WBC: 4.8 10*3/uL (ref 3.4–10.8)

## 2019-01-20 LAB — URINE CULTURE

## 2019-01-23 ENCOUNTER — Encounter: Payer: Self-pay | Admitting: Internal Medicine

## 2019-01-23 ENCOUNTER — Other Ambulatory Visit: Payer: Self-pay | Admitting: Internal Medicine

## 2019-01-23 MED ORDER — IRBESARTAN 150 MG PO TABS: 150.0000 mg | ORAL_TABLET | Freq: Every day | ORAL | 6 refills | Status: DC

## 2019-04-08 ENCOUNTER — Encounter: Payer: Self-pay | Admitting: Internal Medicine

## 2019-04-12 DIAGNOSIS — Z20828 Contact with and (suspected) exposure to other viral communicable diseases: Secondary | ICD-10-CM | POA: Diagnosis not present

## 2019-04-19 DIAGNOSIS — Z20828 Contact with and (suspected) exposure to other viral communicable diseases: Secondary | ICD-10-CM | POA: Diagnosis not present

## 2019-04-25 DIAGNOSIS — Z20828 Contact with and (suspected) exposure to other viral communicable diseases: Secondary | ICD-10-CM | POA: Diagnosis not present

## 2019-05-02 DIAGNOSIS — Z20828 Contact with and (suspected) exposure to other viral communicable diseases: Secondary | ICD-10-CM | POA: Diagnosis not present

## 2019-05-16 DIAGNOSIS — Z20828 Contact with and (suspected) exposure to other viral communicable diseases: Secondary | ICD-10-CM | POA: Diagnosis not present

## 2019-05-23 DIAGNOSIS — Z20828 Contact with and (suspected) exposure to other viral communicable diseases: Secondary | ICD-10-CM | POA: Diagnosis not present

## 2019-05-30 ENCOUNTER — Telehealth: Payer: Self-pay

## 2019-05-30 DIAGNOSIS — Z20828 Contact with and (suspected) exposure to other viral communicable diseases: Secondary | ICD-10-CM | POA: Diagnosis not present

## 2019-05-30 NOTE — Telephone Encounter (Signed)
Virtual Visit Pre-Appointment Phone Call  TELEPHONE CALL NOTE  Monique Aguilar has been deemed a candidate for a follow-up tele-health visit to limit community exposure during the Covid-19 pandemic. I spoke with the patient via phone to ensure availability of phone/video source, confirm preferred email & phone number, and discuss instructions and expectations.  I reminded Monique Aguilar to be prepared with any vital sign and/or heart rhythm information that could potentially be obtained via home monitoring, at the time of her visit. I reminded Monique Aguilar to expect a phone call prior to her visit.  Patient agrees to consent below.  Cleon Gustin, RN 05/30/2019 4:08 PM    FULL LENGTH CONSENT FOR TELE-HEALTH VISIT   I hereby voluntarily request, consent and authorize [facility] and its employed or contracted physicians, physician assistants, nurse practitioners or other licensed health care professionals (the Practitioner), to provide me with telemedicine health care services (the "Services") as deemed necessary by the treating Practitioner. I acknowledge and consent to receive the Services by the Practitioner via telemedicine. I understand that the telemedicine visit will involve communicating with the Practitioner through live audiovisual communication technology and the disclosure of certain medical information by electronic transmission. I acknowledge that I have been given the opportunity to request an in-person assessment or other available alternative prior to the telemedicine visit and am voluntarily participating in the telemedicine visit.  I understand that I have the right to withhold or withdraw my consent to the use of telemedicine in the course of my care at any time, without affecting my right to future care or treatment, and that the Practitioner or I may terminate the telemedicine visit at any time. I understand that I have the right to inspect all information obtained  and/or recorded in the course of the telemedicine visit and may receive copies of available information for a reasonable fee.  I understand that some of the potential risks of receiving the Services via telemedicine include:  [name] Delay or interruption in medical evaluation due to technological equipment failure or disruption; . Information transmitted may not be sufficient (e.g. poor resolution of images) to allow for appropriate medical decision making by the Practitioner; and/or  . In rare instances, security protocols could fail, causing a breach of personal health information.  Furthermore, I acknowledge that it is my responsibility to provide information about my medical history, conditions and care that is complete and accurate to the best of my ability. I acknowledge that Practitioner's advice, recommendations, and/or decision may be based on factors not within their control, such as incomplete or inaccurate data provided by me or distortions of diagnostic images or specimens that may result from electronic transmissions. I understand that the practice of medicine is not an exact science and that Practitioner makes no warranties or guarantees regarding treatment outcomes. I acknowledge that I will receive a copy of this consent concurrently upon execution via email to the email address I last provided but may also request a printed copy by calling the office of [place].    I understand that my insurance will be billed for this visit.   I have read or had this consent read to me. . I understand the contents of this consent, which adequately explains the benefits and risks of the Services being provided via telemedicine.  . I have been provided ample opportunity to ask questions regarding this consent and the Services and have had my questions answered to my satisfaction. [name]  I give my informed consent for the services to be provided through the use of telemedicine in my medical care  By  participating in this telemedicine visit I agree to the above.

## 2019-06-01 ENCOUNTER — Encounter: Payer: Self-pay | Admitting: Physician Assistant

## 2019-06-01 ENCOUNTER — Other Ambulatory Visit: Payer: Self-pay

## 2019-06-01 ENCOUNTER — Telehealth (INDEPENDENT_AMBULATORY_CARE_PROVIDER_SITE_OTHER): Payer: BC Managed Care – PPO | Admitting: Physician Assistant

## 2019-06-01 VITALS — BP 174/86 | HR 90 | Ht 66.0 in | Wt 230.0 lb

## 2019-06-01 DIAGNOSIS — I5033 Acute on chronic diastolic (congestive) heart failure: Secondary | ICD-10-CM | POA: Diagnosis not present

## 2019-06-01 DIAGNOSIS — Q245 Malformation of coronary vessels: Secondary | ICD-10-CM | POA: Diagnosis not present

## 2019-06-01 DIAGNOSIS — R0600 Dyspnea, unspecified: Secondary | ICD-10-CM

## 2019-06-01 DIAGNOSIS — I10 Essential (primary) hypertension: Secondary | ICD-10-CM | POA: Diagnosis not present

## 2019-06-01 MED ORDER — IRBESARTAN 150 MG PO TABS: 150.0000 mg | ORAL_TABLET | Freq: Every day | ORAL | 3 refills | Status: DC

## 2019-06-01 MED ORDER — CARVEDILOL 12.5 MG PO TABS: 12.5000 mg | ORAL_TABLET | Freq: Two times a day (BID) | ORAL | 3 refills | Status: DC

## 2019-06-01 NOTE — Progress Notes (Signed)
Virtual Visit via Telephone Note   This visit type was conducted due to national recommendations for restrictions regarding the COVID-19 Pandemic (e.g. social distancing) in an effort to limit this patient's exposure and mitigate transmission in our community.  Due to her co-morbid illnesses, this patient is at least at moderate risk for complications without adequate follow up.  This format is felt to be most appropriate for this patient at this time.  The patient did not have access to video technology/had technical difficulties with video requiring transitioning to audio format only (telephone).  All issues noted in this document were discussed and addressed.  No physical exam could be performed with this format.  Please refer to the patient's chart for her  consent to telehealth for [facility].   Date:  06/01/2019   ID:  [name], DOB [birth year], MRN [id number]  Patient Location: Other:  Work Provider Location: Home  PCP:  Ladell Pier, MD  Cardiologist:  Ena Dawley, MD  Electrophysiologist:  None   Evaluation Performed:  Follow-Up Visit  Chief Complaint:  Follow up  History of Present Illness:    Monique Aguilar is a 53 y.o. female with hx of atypical chest pain and dyspnea. 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 The patient was advised to undergo an exercise treadmill stress test to assess for exertional ischemia from systolic inter-arterial compression. This was performed and showed no signs of ischemia. She also had normal functional capacity on pulmonary exercise testing. She is followed by Dr. Roxy Manns, chronic diastolic CHF, HTN, and obesity.   Last telemedicine visit 11/03/2018 with Ellen Henri, PA-C because of increased dyspnea edema weight gain and poor response to Lasix.  Weight had gone  from 233 pounds to 241 pounds in 30 days.  Takes was increased to 80 mg daily and potassium to 20 mEq daily.  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 Complains of dyspnea on exertion and orthopnea causing fatigue. Has trouble walking up a flight of stairs. Works 2 jobs. Cut out all sodas and sweets and hasn't lost weight. No regular exercise outside of work. No chest pain.Tries to limit her salt.Doesn't eat out much but eats canned chili and baked beans. BP has been running high as well.   The patient does not have symptoms concerning for COVID-19 infection (fever, chills, cough, or new shortness of breath).    Past Medical History:  Diagnosis Date  . Abnormal CT of the chest    a. 10/2013: 58mm RML pulm nodule, axillary lymphadenopathy. Instructed to f/u PCP.  [name] Anomalous right coronary artery   . Atypical chest pain    a. 10/2013: suspected GI etiology. Nuc - nonischemic, EF 39% but visually higher - f/u echo to clarify showed EF 50-55% with only mild LVH, no significant valvuar disease.  . Chronic diastolic congestive heart failure ([place])   . HTN (hypertension)   . Low back pain   . Microcytic anemia    a. No prior workup.  [name] Migraines    Past Surgical History:  Procedure Laterality Date  . BREAST CYST ASPIRATION    . CESAREAN SECTION  1988  . TONSILLECTOMY  1992?  . TUBAL LIGATION  1995  . [place] EXTRACTION  1995     Current Meds  Medication Sig  . acetaminophen (TYLENOL) 500 MG tablet Take 1 tablet (500 mg total) by mouth every 6 (six) hours as needed.  . carvedilol (COREG) 12.5 MG tablet Take 1 tablet (12.5 mg total) by mouth 2 (two) times daily with a meal.  . EPINEPHrine (AUVI-Q) 0.3 mg/0.3 mL IJ SOAJ injection Use for life-threatening allergic reactions  . furosemide (LASIX) 40 MG tablet Take 2 tablets (80 mg total) by mouth daily.  . irbesartan (AVAPRO) 150 MG tablet Take 1 tablet (150 mg total) by mouth daily.  . potassium chloride (K-DUR)  10 MEQ tablet Take 2 tablets (20 mEq total) by mouth daily.  . SUMAtriptan (IMITREX) 25 MG tablet Take 1 tablet at the start of the headache PO.  May repeat in 2 hours if no improvement.  . [DISCONTINUED] carvedilol (COREG) 6.25 MG tablet Take 1 tablet (6.25 mg total) by mouth 2 (two) times daily with a meal.  . [DISCONTINUED] irbesartan (AVAPRO) 150 MG tablet Take 1 tablet (150 mg total) by mouth daily.     Allergies:   Fruit & vegetable daily [nutritional supplements] and Grass extracts [gramineae pollens]   Social History   Tobacco Use  . Smoking status: Never Smoker  . Smokeless tobacco: Never Used  Substance Use Topics  . Alcohol use: No  . Drug use: No     Family Hx: The patient's family history includes Congestive Heart Failure in her mother; Heart attack (age of onset: 7471) in her father; Heart disease in her father. There is no history of CAD, Colon cancer, Colon polyps, Esophageal cancer, Stomach cancer, or Rectal cancer.  ROS:   Please see the history of present illness.      All other systems reviewed and are negative.   Prior CV studies:   The following studies were reviewed today: Echo 2016Study Conclusions   - Left ventricle: The cavity size was normal. Wall thickness was   normal. Systolic function was normal. The estimated ejection   fraction was in the range of 55% to 60%. Wall motion was normal;   there were no regional wall motion abnormalities. Left   ventricular diastolic function parameters were normal.       Labs/Other Tests and Data Reviewed:    EKG:  No ECG reviewed.  Recent Labs: 11/10/2018: BUN 15; Creatinine, Ser 1.17; NT-Pro BNP 51; Potassium 4.3; Sodium 142 01/18/2019: Hemoglobin 11.9; Platelets 319   Recent Lipid Panel Lab Results  Component Value Date/Time   CHOL 180 04/02/2018 04:01 PM   TRIG 94 04/02/2018 04:01 PM   HDL 60 04/02/2018 04:01 PM   CHOLHDL 3.0 04/02/2018 04:01 PM   CHOLHDL 3.1 10/11/2013 01:25 AM   LDLCALC 101 (H)  04/02/2018 04:01 PM    Wt Readings from Last 3 Encounters:  06/01/19 230 lb (104.3 kg)  11/26/18 243 lb 6.4 oz (110.4 kg)  11/03/18 241 lb (109.3 kg)     Objective:    Vital Signs:  BP (!) 174/86   Pulse 90   Ht 5\' 6"  (1.676 m)   Wt 230 lb (104.3 kg)   BMI 37.12 kg/m    VITAL SIGNS:  reviewed  ASSESSMENT & PLAN:    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 This was performed and showed no signs of ischemia. She also had normal functional capacity on  pulmonary exercise testing. She is followed by Dr. Cornelius Moras,   Acute on Chronic diastolic CHF heart failure hard to assess over the telephone but worsening dyspnea on exertion and edema.  2 g sodium diet.  Recheck echo.  Blood pressure is elevated so we will increase carvedilol.  Essential hypertension blood pressure running high.  Will increase carvedilol to 12.5 mg twice daily.  Follow-up with me after echo.  Obesity has cut out all soda and sweets and still cannot lose weight.  Is willing to talk to the weight loss center.  COVID-19 Education: The signs and symptoms of COVID-19 were discussed with the patient and how to seek care for testing (follow up with PCP or arrange E-visit).   The importance of social distancing was discussed today.  Time:   Today, I have spent 15 minutes with the patient with telehealth technology discussing the above problems.     Medication Adjustments/Labs and Tests Ordered: Current medicines are reviewed at length with the patient today.  Concerns regarding medicines are outlined above.   Tests Ordered: Orders Placed This Encounter  Procedures  . Amb Ref to Medical Weight Management  . ECHOCARDIOGRAM COMPLETE    Medication Changes: Meds ordered this encounter  Medications  .  carvedilol (COREG) 12.5 MG tablet    Sig: Take 1 tablet (12.5 mg total) by mouth 2 (two) times daily with a meal.    Dispense:  180 tablet    Refill:  3  . irbesartan (AVAPRO) 150 MG tablet    Sig: Take 1 tablet (150 mg total) by mouth daily.    Dispense:  90 tablet    Refill:  3    Follow Up:  Either In Person or Virtual in 3 week(s) Jacolyn Reedy PA-C  Signed, Jacolyn Reedy, PA-C  06/01/2019 2:45 PM    [facility]

## 2019-06-01 NOTE — Patient Instructions (Addendum)
Medication Instructions:  Your physician has recommended you make the following change in your medication:   INCREASE: carvedilol (coreg) to 12.5 mg by mouth twice a day  *If you need a refill on your cardiac medications before your next appointment, please call your pharmacy*  Lab Work: None ordered  If you have labs (blood work) drawn today and your tests are completely normal, you will receive your results only by: [name]. MyChart Message (if you have MyChart) OR . A paper copy in the mail If you have any lab test that is abnormal or we need to change your treatment, we will call you to review the results.  Testing/Procedures: Your physician has requested that you have an echocardiogram. Echocardiography is a painless test that uses sound waves to create images of your heart. It provides your doctor with information about the size and shape of your heart and how well your heart's chambers and valves are working. This procedure takes approximately one hour. There are no restrictions for this procedure.   Follow-Up: At [facility], you and your health needs are our priority.  As part of our continuing mission to provide you with exceptional heart care, we have created designated Provider Care Teams.  These Care Teams include your primary Cardiologist (physician) and Advanced Practice Providers (APPs -  Physician Assistants and Nurse Practitioners) who all work together to provide you with the care you need, when you need it.  Your next appointment:   Follow up with Jacolyn ReedyMichele Lenze, PA in 2-3 weeks in the office or virtual  Other Instructions Two Gram Sodium Diet 2000 mg  What is Sodium? Sodium is a mineral found naturally in many foods. The most significant source of sodium in the diet is table salt, which is about 40% sodium.  Processed, convenience, and preserved foods also contain a large amount of sodium.  The body needs only 500 mg of sodium daily to function,  A normal diet provides  more than enough sodium even if you do not use salt.  Why Limit Sodium? A build up of sodium in the body can cause thirst, increased blood pressure, shortness of breath, and water retention.  Decreasing sodium in the diet can reduce edema and risk of heart attack or stroke associated with high blood pressure.  Keep in mind that there are many other factors involved in these health problems.  Heredity, obesity, lack of exercise, cigarette smoking, stress and what you eat all play a role.  General Guidelines:  Do not add salt at the table or in cooking.  One teaspoon of salt contains over 2 grams of sodium.  Read food labels  Avoid processed and convenience foods  Ask your dietitian before eating any foods not dicussed in the menu planning guidelines  Consult your physician if you wish to use a salt substitute or a sodium containing medication such as antacids.  Limit milk and milk products to 16 oz (2 cups) per day.  Shopping Hints:  READ LABELS!! "Dietetic" does not necessarily mean low sodium.  Salt and other sodium ingredients are often added to foods during processing.   Menu Planning Guidelines Food Group Choose More Often Avoid  Beverages (see also the milk group All fruit juices, low-sodium, salt-free vegetables juices, low-sodium carbonated beverages Regular vegetable or tomato juices, commercially softened water used for drinking or cooking  Breads and Cereals Enriched white, wheat, rye and pumpernickel bread, hard rolls and dinner rolls; muffins, cornbread and waffles; most dry cereals, cooked cereal  without added salt; unsalted crackers and breadsticks; low sodium or homemade bread crumbs Bread, rolls and crackers with salted tops; quick breads; instant hot cereals; pancakes; commercial bread stuffing; self-rising flower and biscuit mixes; regular bread crumbs or cracker crumbs  Desserts and Sweets Desserts and sweets mad with mild should be within allowance Instant pudding mixes  and cake mixes  Fats Butter or margarine; vegetable oils; unsalted salad dressings, regular salad dressings limited to 1 Tbs; light, sour and heavy cream Regular salad dressings containing bacon fat, bacon bits, and salt pork; snack dips made with instant soup mixes or processed cheese; salted nuts  Fruits Most fresh, frozen and canned fruits Fruits processed with salt or sodium-containing ingredient (some dried fruits are processed with sodium sulfites        Vegetables Fresh, frozen vegetables and low- sodium canned vegetables Regular canned vegetables, sauerkraut, pickled vegetables, and others prepared in brine; frozen vegetables in sauces; vegetables seasoned with ham, bacon or salt pork  Condiments, Sauces, Miscellaneous  Salt substitute with physician's approval; pepper, herbs, spices; vinegar, lemon or lime juice; hot pepper sauce; garlic powder, onion powder, low sodium soy sauce (1 Tbs.); low sodium condiments (ketchup, chili sauce, mustard) in limited amounts (1 tsp.) fresh ground horseradish; unsalted tortilla chips, pretzels, potato chips, popcorn, salsa (1/4 cup) Any seasoning made with salt including garlic salt, celery salt, onion salt, and seasoned salt; sea salt, rock salt, kosher salt; meat tenderizers; monosodium glutamate; mustard, regular soy sauce, barbecue, sauce, chili sauce, teriyaki sauce, steak sauce, Worcestershire sauce, and most flavored vinegars; canned gravy and mixes; regular condiments; salted snack foods, olives, picles, relish, horseradish sauce, catsup   Food preparation: Try these seasonings Meats:    Pork Sage, onion Serve with applesauce  Chicken Poultry seasoning, thyme, parsley Serve with cranberry sauce  Lamb Curry powder, rosemary, garlic, thyme Serve with mint sauce or jelly  Veal Marjoram, basil Serve with current jelly, cranberry sauce  Beef Pepper, bay leaf Serve with dry mustard, unsalted chive butter  Fish [place] leaf, dill Serve with unsalted lemon  butter, unsalted parsley butter  Vegetables:    Asparagus Lemon juice   Broccoli Lemon juice   Carrots Mustard dressing parsley, mint, nutmeg, glazed with unsalted butter and sugar   Green beans Marjoram, lemon juice, nutmeg,dill seed   Tomatoes Basil, marjoram, onion   Spice /blend for [employer]" 4 tsp ground thyme 1 tsp ground sage 3 tsp ground rosemary 4 tsp ground marjoram   Test your knowledge 1. A product that says "Salt Free" may still contain sodium. True or False 2. Garlic Powder and Hot Pepper Sauce an be used as alternative seasonings.True or False 3. Processed foods have more sodium than fresh foods.  True or False 4. Canned Vegetables have less sodium than froze True or False  WAYS TO DECREASE YOUR SODIUM INTAKE 1. Avoid the use of added salt in cooking and at the table.  Table salt (and other prepared seasonings which contain salt) is probably one of the greatest sources of sodium in the diet.  Unsalted foods can gain flavor from the sweet, sour, and butter taste sensations of herbs and spices.  Instead of using salt for seasoning, try the following seasonings with the foods listed.  Remember: how you use them to enhance natural food flavors is limited only by your creativity... Allspice-Meat, fish, eggs, fruit, peas, red and yellow vegetables Almond Extract-Fruit baked goods Anise Seed-Sweet breads, fruit, carrots, beets, cottage cheese, cookies (tastes like licorice) Basil-Meat, fish,  eggs, vegetables, rice, vegetables salads, soups, sauces Bay Leaf-Meat, fish, stews, poultry Burnet-Salad, vegetables (cucumber-like flavor) Caraway Seed-Bread, cookies, cottage cheese, meat, vegetables, cheese, rice Cardamon-Baked goods, fruit, soups Celery Powder or seed-Salads, salad dressings, sauces, meatloaf, soup, bread.Do not use  celery salt Chervil-Meats, salads, fish, eggs, vegetables, cottage cheese (parsley-like flavor) Chili Power-Meatloaf, chicken cheese, corn, eggplant,  egg dishes Chives-Salads cottage cheese, egg dishes, soups, vegetables, sauces Cilantro-Salsa, casseroles Cinnamon-Baked goods, fruit, pork, lamb, chicken, carrots Cloves-Fruit, baked goods, fish, pot roast, green beans, beets, carrots Coriander-Pastry, cookies, meat, salads, cheese (lemon-orange flavor) Cumin-Meatloaf, fish,cheese, eggs, cabbage,fruit pie (caraway flavor) [employer], fruit, eggs, fish, poultry, cottage cheese, vegetables Dill Seed-Meat, cottage cheese, poultry, vegetables, fish, salads, bread Fennel Seed-Bread, cookies, apples, pork, eggs, fish, beets, cabbage, cheese, Licorice-like flavor Garlic-(buds or powder) Salads, meat, poultry, fish, bread, butter, vegetables, potatoes.Do not  use garlic salt Ginger-Fruit, vegetables, baked goods, meat, fish, poultry Horseradish Root-Meet, vegetables, butter Lemon Juice or Extract-Vegetables, fruit, tea, baked goods, fish salads Mace-Baked goods fruit, vegetables, fish, poultry (taste like nutmeg) Maple Extract-Syrups Marjoram-Meat, chicken, fish, vegetables, breads, green salads (taste like Sage) Mint-Tea, lamb, sherbet, vegetables, desserts, carrots, cabbage Mustard, Dry or Seed-Cheese, eggs, meats, vegetables, poultry Nutmeg-Baked goods, fruit, chicken, eggs, vegetables, desserts Onion Powder-Meat, fish, poultry, vegetables, cheese, eggs, bread, rice salads (Do not use   Onion salt) Orange Extract-Desserts, baked goods Oregano-Pasta, eggs, cheese, onions, pork, lamb, fish, chicken, vegetables, green salads Paprika-Meat, fish, poultry, eggs, cheese, vegetables Parsley Flakes-Butter, vegetables, meat fish, poultry, eggs, bread, salads (certain forms may   Contain sodium Pepper-Meat fish, poultry, vegetables, eggs Peppermint Extract-Desserts, baked goods Poppy Seed-Eggs, bread, cheese, fruit dressings, baked goods, noodles, vegetables, cottage  [employer], poultry, meat, fish,  cauliflower, turnips,eggs bread Saffron-Rice, bread, veal, chicken, fish, eggs Sage-Meat, fish, poultry, onions, eggplant, tomateos, pork, stews Savory-Eggs, salads, poultry, meat, rice, vegetables, soups, pork Tarragon-Meat, poultry, fish, eggs, butter, vegetables (licorice-like flavor)  Thyme-Meat, poultry, fish, eggs, vegetables, (clover-like flavor), sauces, soups Tumeric-Salads, butter, eggs, fish, rice, vegetables (saffron-like flavor) Vanilla Extract-Baked goods, candy Vinegar-Salads, vegetables, meat marinades Walnut Extract-baked goods, candy  2. Choose your Foods Wisely   The following is a list of foods to avoid which are high in sodium:  Meats-Avoid all smoked, canned, salt cured, dried and kosher meat and fish as well as Anchovies   [employer] meats:Bologna, Liverwurst, Pastrami Canned meat or fish  Marinated herring Caviar    Pepperoni Corned Beef   Pizza Dried chipped beef  Salami Frozen breaded fish or meat Salt pork Frankfurters or hot dogs  Sardines Gefilte fish   Sausage Ham (boiled ham, Proscuitto Smoked butt    spiced ham)   Spam      TV Dinners Vegetables Canned vegetables (Regular) Relish Canned mushrooms  Sauerkraut Olives    Tomato juice Pickles  Bakery and Dessert Products Canned puddings  Cream pies Cheesecake   Decorated cakes Cookies  Beverages/Juices Tomato juice, regular  Gatorade   V-8 vegetable juice, regular  Breads and Cereals Biscuit mixes   Salted potato chips, corn chips, pretzels Bread stuffing mixes  Salted crackers and rolls Pancake and waffle mixes Self-rising flour  Seasonings Accent    Meat sauces Barbecue sauce  Meat tenderizer Catsup    Monosodium glutamate (MSG) Celery salt   Onion salt Chili sauce   Prepared mustard Garlic salt   Salt, seasoned salt, sea salt Gravy mixes   Soy sauce Horseradish   Steak sauce Ketchup   Tartar sauce  Lite salt    Teriyaki sauce Marinade mixes   Worcestershire  sauce  Others Baking powder   Cocoa and cocoa mixes Baking soda   Commercial casserole mixes Candy-caramels, chocolate  Dehydrated soups    Bars, fudge,nougats  Instant rice and pasta mixes Canned broth or soup  Maraschino cherries Cheese, aged and processed cheese and cheese spreads  Learning Assessment Quiz  Indicated T (for True) or F (for False) for each of the following statements:  1. _____ Fresh fruits and vegetables and unprocessed grains are generally low in sodium 2. _____ Water may contain a considerable amount of sodium, depending on the source 3. _____ You can always tell if a food is high in sodium by tasting it 4. _____ Certain laxatives my be high in sodium and should be avoided unless prescribed   by a physician or pharmacist 5. _____ Salt substitutes may be used freely by anyone on a sodium restricted diet 6. _____ Sodium is present in table salt, food additives and as a natural component of   most foods 7. _____ Table salt is approximately 90% sodium 8. _____ Limiting sodium intake may help prevent excess fluid accumulation in the body 9. _____ On a sodium-restricted diet, seasonings such as bouillon soy sauce, and    cooking wine should be used in place of table salt 10. _____ On an ingredient list, a product which lists monosodium glutamate as the first   ingredient is an appropriate food to include on a low sodium diet  Circle the best answer(s) to the following statements (Hint: there may be more than one correct answer)  11. On a low-sodium diet, some acceptable snack items are:    A. Olives  F. Bean dip   K. Grapefruit juice    B. Salted Pretzels G. Commercial Popcorn   L. Canned peaches    C. Carrot Sticks  H. Bouillon   M. Unsalted nuts   D. [place] fries  I. Peanut butter crackers N. Salami   E. Sweet pickles J. Tomato Juice   O. Pizza  12.  Seasonings that may be used freely on a reduced - sodium diet include   A. Lemon wedges F.Monosodium  glutamate K. Celery seed    B.Soysauce   G. Pepper   L. Mustard powder   C. Sea salt  H. Cooking wine  M. Onion flakes   D. Vinegar  E. Prepared horseradish N. Salsa   E. Sage   J. Worcestershire sauce  O. Chutney

## 2019-06-13 DIAGNOSIS — Z20828 Contact with and (suspected) exposure to other viral communicable diseases: Secondary | ICD-10-CM | POA: Diagnosis not present

## 2019-06-15 ENCOUNTER — Telehealth (HOSPITAL_COMMUNITY): Payer: Self-pay

## 2019-06-15 NOTE — Telephone Encounter (Signed)
New message    Just an FYI. We have made several attempts to contact this patient including sending a letter to schedule or reschedule their echocardiogram. We will be removing the patient from the echo WQ.   11.11.20 @ 11:57am both # are the same - lm on home vm - Monique Aguilar  11.5.20 @ 12:28pm line rings busy - both # are the same - Monique Aguilar  10.29.20 @ 2:19pm call / spoke with patient will call back - Monique Aguilar

## 2019-06-20 ENCOUNTER — Other Ambulatory Visit: Payer: Self-pay

## 2019-06-20 ENCOUNTER — Emergency Department (HOSPITAL_COMMUNITY): Payer: BC Managed Care – PPO

## 2019-06-20 ENCOUNTER — Emergency Department (HOSPITAL_COMMUNITY)
Admission: EM | Admit: 2019-06-20 | Discharge: 2019-06-20 | Disposition: A | Discharge status: Home / Self Care | Payer: BC Managed Care – PPO | Attending: Emergency Medicine | Admitting: Emergency Medicine

## 2019-06-20 ENCOUNTER — Emergency Department (HOSPITAL_BASED_OUTPATIENT_CLINIC_OR_DEPARTMENT_OTHER): Payer: BC Managed Care – PPO

## 2019-06-20 DIAGNOSIS — M7989 Other specified soft tissue disorders: Secondary | ICD-10-CM | POA: Diagnosis not present

## 2019-06-20 DIAGNOSIS — R52 Pain, unspecified: Secondary | ICD-10-CM | POA: Diagnosis not present

## 2019-06-20 DIAGNOSIS — M79652 Pain in left thigh: Secondary | ICD-10-CM | POA: Diagnosis present

## 2019-06-20 DIAGNOSIS — I110 Hypertensive heart disease with heart failure: Secondary | ICD-10-CM | POA: Insufficient documentation

## 2019-06-20 DIAGNOSIS — R079 Chest pain, unspecified: Secondary | ICD-10-CM | POA: Diagnosis not present

## 2019-06-20 DIAGNOSIS — L03116 Cellulitis of left lower limb: Secondary | ICD-10-CM | POA: Insufficient documentation

## 2019-06-20 DIAGNOSIS — I5032 Chronic diastolic (congestive) heart failure: Secondary | ICD-10-CM | POA: Diagnosis not present

## 2019-06-20 DIAGNOSIS — Z20828 Contact with and (suspected) exposure to other viral communicable diseases: Secondary | ICD-10-CM | POA: Diagnosis not present

## 2019-06-20 DIAGNOSIS — R0789 Other chest pain: Secondary | ICD-10-CM

## 2019-06-20 DIAGNOSIS — M79605 Pain in left leg: Secondary | ICD-10-CM

## 2019-06-20 DIAGNOSIS — Z79899 Other long term (current) drug therapy: Secondary | ICD-10-CM | POA: Insufficient documentation

## 2019-06-20 DIAGNOSIS — R0602 Shortness of breath: Secondary | ICD-10-CM | POA: Diagnosis not present

## 2019-06-20 LAB — CBC
HCT: 38.9 % (ref 36.0–46.0)
Hemoglobin: 12.4 g/dL (ref 12.0–15.0)
MCH: 26.2 pg (ref 26.0–34.0)
MCHC: 31.9 g/dL (ref 30.0–36.0)
MCV: 82.1 fL (ref 80.0–100.0)
Platelets: 357 10*3/uL (ref 150–400)
RBC: 4.74 MIL/uL (ref 3.87–5.11)
RDW: 14.5 % (ref 11.5–15.5)
WBC: 5.1 10*3/uL (ref 4.0–10.5)
nRBC: 0 % (ref 0.0–0.2)

## 2019-06-20 LAB — BASIC METABOLIC PANEL
Anion gap: 13 (ref 5–15)
BUN: 12 mg/dL (ref 6–20)
CO2: 26 mmol/L (ref 22–32)
Calcium: 10 mg/dL (ref 8.9–10.3)
Chloride: 99 mmol/L (ref 98–111)
Creatinine, Ser: 1.14 mg/dL — ABNORMAL HIGH (ref 0.44–1.00)
GFR calc Af Amer: >60 mL/min (ref >60)
GFR calc non Af Amer: 55 mL/min — ABNORMAL LOW (ref >60)
Glucose, Bld: 90 mg/dL (ref 70–99)
Potassium: 4.4 mmol/L (ref 3.5–5.1)
Sodium: 138 mmol/L (ref 135–145)

## 2019-06-20 LAB — I-STAT BETA HCG BLOOD, ED (MC, WL, AP ONLY): I-stat hCG, quantitative: <5 m[IU]/mL (ref <5)

## 2019-06-20 LAB — BRAIN NATRIURETIC PEPTIDE: B Natriuretic Peptide: 17.4 pg/mL (ref 0.0–100.0)

## 2019-06-20 LAB — TROPONIN I (HIGH SENSITIVITY)
Troponin I (High Sensitivity): 3 ng/L (ref <18)
Troponin I (High Sensitivity): 2 ng/L (ref <18)

## 2019-06-20 LAB — D-DIMER, QUANTITATIVE: D-Dimer, Quant: 0.62 ug/mL-FEU — ABNORMAL HIGH (ref 0.00–0.50)

## 2019-06-20 MED ORDER — HYDROMORPHONE HCL 1 MG/ML IJ SOLN
1.0000 mg | Freq: Once | INTRAMUSCULAR | Status: AC
  Administered 2019-06-20: 1 mg via INTRAVENOUS
  Filled 2019-06-20: qty 1

## 2019-06-20 MED ORDER — CEPHALEXIN 500 MG PO CAPS: 500.0000 mg | ORAL_CAPSULE | Freq: Four times a day (QID) | ORAL | 0 refills | Status: DC

## 2019-06-20 MED ORDER — IOHEXOL 350 MG/ML SOLN
75.0000 mL | Freq: Once | INTRAVENOUS | Status: AC | PRN
  Administered 2019-06-20: 75 mL via INTRAVENOUS

## 2019-06-20 NOTE — Progress Notes (Signed)
Lower extremity venous has been completed.   Preliminary results in CV Proc.   Abram Sander 06/20/2019 4:21 PM

## 2019-06-20 NOTE — ED Notes (Signed)
Pt 's left outer thigh is erythematous. Pt's legs are the same size, no evidence of swollen leg.

## 2019-06-20 NOTE — ED Provider Notes (Signed)
[facility] EMERGENCY DEPARTMENT Provider Note   CSN: [id number] Arrival date & time: 06/20/19  1148     History   Chief Complaint Chief Complaint  Patient presents with  . Chest Pain  . Leg Pain    HPI Monique Aguilar is a 53 y.o. female.     HPI  53 year old female presents with left thigh pain and chest pain.  Both symptoms started last night though the thigh pain seemed to start first.  The thigh felt a little warm.  Chest pain feels like a pressure with a little bit of shortness of breath that has improved.  The chest pain has been there since yesterday.  She has had a DVT before and the leg feels somewhat similar.  Pain is about 8 out of 10.  She has not taken anything for the pain.  Is not currently on blood thinners as this was a remote DVT. No leg swelling or unilateral swelling.  Past Medical History:  Diagnosis Date  . Abnormal CT of the chest    a. 10/2013: 20mm RML pulm nodule, axillary lymphadenopathy. Instructed to f/u PCP.  [name] Anomalous right coronary artery   . Atypical chest pain    a. 10/2013: suspected GI etiology. Nuc - nonischemic, EF 39% but visually higher - f/u echo to clarify showed EF 50-55% with only mild LVH, no significant valvuar disease.  . Chronic diastolic congestive heart failure (HCC)   . HTN (hypertension)   . Low back pain   . Microcytic anemia    a. No prior workup.  . Migraines     Patient Active Problem List   Diagnosis Date Noted  . Stress incontinence 10/08/2018  . Food allergy 10/08/2018  . Left breast mass 08/15/2016  . Migraine without aura and without status migrainosus, not intractable 08/15/2016  . Anomalous right coronary artery   . Chronic diastolic congestive heart failure (HCC)   . Hyperlipidemia 05/31/2014  . Family history of premature coronary artery disease 05/31/2014  . Morbid obesity (HCC) 05/31/2014  . HTN (hypertension) 10/11/2013  . Microcytic anemia 12/16/2012  . Renal insufficiency  12/16/2012    Past Surgical History:  Procedure Laterality Date  . BREAST CYST ASPIRATION    . CESAREAN SECTION  1988  . TONSILLECTOMY  1992?  . TUBAL LIGATION  1995  . WISDOM TOOTH EXTRACTION  1995     OB History   No obstetric history on file.      Home Medications    Prior to Admission medications   Medication Sig Start Date End Date Taking? Authorizing Provider  acetaminophen (TYLENOL) 500 MG tablet Take 1 tablet (500 mg total) by mouth every 6 (six) hours as needed. 02/11/18   Georgetta Haber, NP  carvedilol (COREG) 12.5 MG tablet Take 1 tablet (12.5 mg total) by mouth 2 (two) times daily with a meal. 06/01/19   Dyann Kief, PA-C  EPINEPHrine (AUVI-Q) 0.3 mg/0.3 mL IJ SOAJ injection Use for life-threatening allergic reactions 11/26/18   Marcelyn Bruins, MD  furosemide (LASIX) 40 MG tablet Take 2 tablets (80 mg total) by mouth daily. 11/03/18   Robbie Lis M, PA-C  irbesartan (AVAPRO) 150 MG tablet Take 1 tablet (150 mg total) by mouth daily. 06/01/19   Dyann Kief, PA-C  potassium chloride (K-DUR) 10 MEQ tablet Take 2 tablets (20 mEq total) by mouth daily. 11/03/18   Robbie Lis M, PA-C  SUMAtriptan (IMITREX) 25 MG tablet Take 1 tablet at the  start of the headache PO.  May repeat in 2 hours if no improvement. 10/08/18   Ladell Pier, MD    Family History Family History  Problem Relation Age of Onset  . Heart disease Father        Dx early 59's (? heart valve)  . Heart attack Father 58  . Congestive Heart Failure Mother   . CAD Neg Hx   . Colon cancer Neg Hx   . Colon polyps Neg Hx   . Esophageal cancer Neg Hx   . Stomach cancer Neg Hx   . Rectal cancer Neg Hx     Social History Social History   Tobacco Use  . Smoking status: Never Smoker  . Smokeless tobacco: Never Used  Substance Use Topics  . Alcohol use: No  . Drug use: No     Allergies   Fruit & vegetable daily [nutritional supplements] and Grass extracts [gramineae  pollens]   Review of Systems Review of Systems  Constitutional: Negative for fever.  Respiratory: Positive for shortness of breath.   Cardiovascular: Positive for chest pain. Negative for leg swelling.  Musculoskeletal: Positive for myalgias.  All other systems reviewed and are negative.    Physical Exam Updated Vital Signs BP 137/76   Pulse 64   Temp 98.5 F (36.9 C)   Resp 17   SpO2 100%   Physical Exam Vitals signs and nursing note reviewed.  Constitutional:      Appearance: She is well-developed. She is obese.  HENT:     Head: Normocephalic and atraumatic.     Right Ear: External ear normal.     Left Ear: External ear normal.     Nose: Nose normal.  Eyes:     General:        Right eye: No discharge.        Left eye: No discharge.  Cardiovascular:     Rate and Rhythm: Normal rate and regular rhythm.     Pulses:          Dorsalis pedis pulses are 2+ on the left side.     Heart sounds: Normal heart sounds.  Pulmonary:     Effort: Pulmonary effort is normal.     Breath sounds: Normal breath sounds.  Chest:     Chest wall: Tenderness present.    Abdominal:     Palpations: Abdomen is soft.     Tenderness: There is no abdominal tenderness.  Musculoskeletal:     Right lower leg: No edema.     Left lower leg: No edema.     Comments: No significant calf/thigh tenderness in left leg. Mild warmth to anterior thigh with minimal erythema.   Skin:    General: Skin is warm and dry.  Neurological:     Mental Status: She is alert.  Psychiatric:        Mood and Affect: Mood is not anxious.      ED Treatments / Results  Labs (all labs ordered are listed, but only abnormal results are displayed) Labs Reviewed  BASIC METABOLIC PANEL - Abnormal; Notable for the following components:      Result Value   Creatinine, Ser 1.14 (*)    GFR calc non Af Amer 55 (*)    All other components within normal limits  D-DIMER, QUANTITATIVE (NOT AT [facility]) - Abnormal; Notable for  the following components:   D-Dimer, Quant 0.62 (*)    All other components within normal limits  CBC  BRAIN  NATRIURETIC PEPTIDE  I-STAT BETA HCG BLOOD, ED ([facility], [facility], [facility] ONLY)  TROPONIN I (HIGH SENSITIVITY)  TROPONIN I (HIGH SENSITIVITY)    EKG EKG Interpretation  Date/Time:  Monday June 20 2019 12:03:58 EST Ventricular Rate:  81 PR Interval:  164 QRS Duration: 86 QT Interval:  350 QTC Calculation: 406 R Axis:   69 Text Interpretation: Normal sinus rhythm no acute ST/T changes similar to Dec 2017 Confirmed by Pricilla LovelessGoldston, Dametra Whetsel [phone]([id number]) on 06/20/2019 3:33:09 PM   Radiology Dg Chest 2 View  Result Date: 06/20/2019 CLINICAL DATA:  53 year old female with chest pain. EXAM: CHEST - 2 VIEW COMPARISON:  Chest radiograph dated 09/05/2016. FINDINGS: The lungs are clear. There is no pleural effusion or pneumothorax. The cardiac silhouette is within normal limits. No acute osseous pathology. IMPRESSION: No active cardiopulmonary disease. Electronically Signed   By: Elgie CollardArash  Radparvar M.D.   On: 06/20/2019 12:35   Vas [place] Lower Extremity Venous (dvt) ([facility] And [facility] 7a-7p)  Result Date: 06/20/2019  Lower Venous Study Indications: Pain, and Swelling.  Comparison Study: no prior Performing Technologist: Blanch MediaMegan Riddle RVS  Examination Guidelines: A complete evaluation includes B-mode imaging, spectral Doppler, color Doppler, and power Doppler as needed of all accessible portions of each vessel. Bilateral testing is considered an integral part of a complete examination. Limited examinations for reoccurring indications may be performed as noted.  +-----+---------------+---------+-----------+----------+--------------+ RIGHTCompressibilityPhasicitySpontaneityPropertiesThrombus Aging +-----+---------------+---------+-----------+----------+--------------+ CFV  Full           Yes      Yes                                 +-----+---------------+---------+-----------+----------+--------------+    +---------+---------------+---------+-----------+----------+--------------+ LEFT     CompressibilityPhasicitySpontaneityPropertiesThrombus Aging +---------+---------------+---------+-----------+----------+--------------+ CFV      Full           Yes      Yes                                 +---------+---------------+---------+-----------+----------+--------------+ SFJ      Full                                                        +---------+---------------+---------+-----------+----------+--------------+ FV Prox  Full                                                        +---------+---------------+---------+-----------+----------+--------------+ FV Mid   Full                                                        +---------+---------------+---------+-----------+----------+--------------+ FV Distal               Yes      Yes                                 +---------+---------------+---------+-----------+----------+--------------+  PFV      Full                                                        +---------+---------------+---------+-----------+----------+--------------+ POP      Full           Yes      Yes                                 +---------+---------------+---------+-----------+----------+--------------+ PTV      Full                                                        +---------+---------------+---------+-----------+----------+--------------+ PERO     Full                                                        +---------+---------------+---------+-----------+----------+--------------+     Summary: Right: No evidence of common femoral vein obstruction. Left: There is no evidence of deep vein thrombosis in the lower extremity. No cystic structure found in the popliteal fossa.  *See table(s) above for measurements and observations.    Preliminary     Procedures Procedures (including critical care time)  Medications Ordered in ED  Medications  HYDROmorphone (DILAUDID) injection 1 mg (has no administration in time range)     Initial Impression / Assessment and Plan / ED Course  I have reviewed the triage vital signs and the nursing notes.  Pertinent labs & imaging results that were available during my care of the patient were reviewed by me and considered in my medical decision making (see chart for details).        Patient will be worked up for DVT/PE. If this workup is negative, would treat for cellulitis of thigh. CP is atypical and reproducible. Doubt ACS, especially with 2 negative troponins and benign ECG. Care transferred to Dr. Madilyn Hook.   Final Clinical Impressions(s) / ED Diagnoses   Final diagnoses:  None    ED Discharge Orders    None       Pricilla Loveless, MD 06/20/19 1630

## 2019-06-20 NOTE — ED Provider Notes (Signed)
Patient care assumed at 1600. Patient here for evaluation of left leg pain, chest pain. Vascular ultrasound and CTA are pending. Ultrasound is negative for DVT. CTA is negative for PE. Presentation is not consistent with ACS, dissection, pneumonia, COVID-19 infection. Discussed with patient home care for possible early cellulitis of left leg, leg pain and chest wall pain. Discussed outpatient follow-up and return precautions.   Quintella Reichert, MD 06/20/19 [phone]

## 2019-06-20 NOTE — ED Triage Notes (Signed)
Pt endorses left leg pain around the knee area that began yesterday and began having central chest pain with shob last night. No recent travel. Hx of blood clot in left leg. VSS.

## 2019-06-21 ENCOUNTER — Encounter: Payer: Self-pay | Admitting: Internal Medicine

## 2019-06-21 DIAGNOSIS — G43009 Migraine without aura, not intractable, without status migrainosus: Secondary | ICD-10-CM

## 2019-06-22 MED ORDER — TOPIRAMATE 25 MG PO TABS: ORAL_TABLET | ORAL | 1 refills | Status: DC

## 2019-07-15 ENCOUNTER — Encounter: Payer: Self-pay | Admitting: Internal Medicine

## 2019-08-12 ENCOUNTER — Encounter: Payer: Self-pay | Admitting: Internal Medicine

## 2019-08-17 MED ORDER — BENZONATATE 100 MG PO CAPS: 100.0000 mg | ORAL_CAPSULE | Freq: Three times a day (TID) | ORAL | 0 refills | Status: DC | PRN

## 2019-08-19 MED ORDER — TRAZODONE HCL 50 MG PO TABS: 25.0000 mg | ORAL_TABLET | Freq: Every evening | ORAL | 3 refills | Status: DC | PRN

## 2019-08-19 NOTE — Addendum Note (Signed)
Addended by: Jonah Blue B on: 08/19/2019 08:00 AM   Modules accepted: Orders

## 2019-09-06 ENCOUNTER — Encounter: Payer: Self-pay | Admitting: Internal Medicine

## 2019-09-08 ENCOUNTER — Ambulatory Visit (INDEPENDENT_AMBULATORY_CARE_PROVIDER_SITE_OTHER): Payer: Self-pay | Admitting: Family Medicine

## 2019-09-08 ENCOUNTER — Ambulatory Visit: Payer: Self-pay | Attending: Internal Medicine | Admitting: Internal Medicine

## 2019-09-08 ENCOUNTER — Other Ambulatory Visit: Payer: Self-pay

## 2019-09-08 VITALS — BP 150/90 | HR 80 | Temp 98.4°F | Wt 216.2 lb

## 2019-09-08 DIAGNOSIS — R112 Nausea with vomiting, unspecified: Secondary | ICD-10-CM

## 2019-09-08 DIAGNOSIS — Z20822 Contact with and (suspected) exposure to covid-19: Secondary | ICD-10-CM | POA: Diagnosis not present

## 2019-09-08 DIAGNOSIS — R634 Abnormal weight loss: Secondary | ICD-10-CM

## 2019-09-08 DIAGNOSIS — B349 Viral infection, unspecified: Secondary | ICD-10-CM

## 2019-09-08 DIAGNOSIS — R197 Diarrhea, unspecified: Secondary | ICD-10-CM

## 2019-09-08 DIAGNOSIS — I5032 Chronic diastolic (congestive) heart failure: Secondary | ICD-10-CM | POA: Diagnosis not present

## 2019-09-08 DIAGNOSIS — R0602 Shortness of breath: Secondary | ICD-10-CM

## 2019-09-08 DIAGNOSIS — R52 Pain, unspecified: Secondary | ICD-10-CM

## 2019-09-08 MED ORDER — DICYCLOMINE HCL 10 MG PO CAPS: 10.0000 mg | ORAL_CAPSULE | Freq: Three times a day (TID) | ORAL | 0 refills | Status: DC | PRN

## 2019-09-08 MED ORDER — PROMETHAZINE-DM 6.25-15 MG/5ML PO SYRP: 5.0000 mL | ORAL_SOLUTION | Freq: Four times a day (QID) | ORAL | 0 refills | Status: DC | PRN

## 2019-09-08 MED ORDER — METOCLOPRAMIDE HCL 10 MG PO TABS: 10.0000 mg | ORAL_TABLET | Freq: Four times a day (QID) | ORAL | 0 refills | Status: DC | PRN

## 2019-09-08 NOTE — Progress Notes (Signed)
Virtual Visit via Telephone Note Due to current restrictions/limitations of in-office visits due to the COVID-19 pandemic, this scheduled clinical appointment was converted to a telehealth visit I connected with Nyoka Lint on 09/08/19 at 1:49 P.M by telephone and verified that I am speaking with the correct person using two identifiers. I am in my office.  The patient is at home.  Only the patient and myself participated in this encounter.  I discussed the limitations, risks, security and privacy concerns of performing an evaluation and management service by telephone and the availability of in person appointments. I also discussed with the patient that there may be a patient responsible charge related to this service. The patient expressed understanding and agreed to proceed.   History of Present Illness: Pt with hx of HTN, dCHF, anomalous RCA, anemia, migraines, obesity, HL, fhx of CAD  Patient had sent me a MyChart message last month reporting symptoms of an acute viral illness for which she was taken out of work 08/10/2019.  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  She had Covid testing done through her employer at that time which was negative.  I have prescribed some Tessalon Perles for cough and recommended that she take Pepto-Bismol for the diarrhea. -She requested appointment today because her symptoms are no better and she has remained out of work since 08/10/2019 and needs FMLA completed.  Still having cough with chest pains when she coughs, loose stools and vomiting.  No shortness of breath per se but states she has not been moving around much.  She stays in bed or on the couch. -eating bland foods like crackers "but everything comes back up even with taking PeptoBismol." -Bowel movements are loose and she is having 3 or more a day.  No blood in the stools.  She feels weak.  Reports her weight is now 213 pounds down 17 pounds from when we  last saw her in June/2020.    Cough is dry, + body aches. Temp today is 99.7 today.  For the past weak it has been 99. No recent use of abx.  She has quarantine in her house for the past month.   Current Outpatient Medications  Medication Instructions  . acetaminophen (TYLENOL) 500 mg, Oral, Every 6 hours PRN  . benzonatate (TESSALON PERLES) 100 mg, Oral, 3 times daily PRN  . calcium carbonate (TUMS - DOSED IN MG ELEMENTAL CALCIUM) 500 MG chewable tablet 1 tablet, Oral, As needed  . carvedilol (COREG) 12.5 mg, Oral, 2 times daily with meals  . cephALEXin (KEFLEX) 500 mg, Oral, 4 times daily  . EPINEPHrine (AUVI-Q) 0.3 mg/0.3 mL IJ SOAJ injection Use for life-threatening allergic reactions  . furosemide (LASIX) 80 mg, Oral, Daily  . ibuprofen (ADVIL) 400 mg, Oral, Every 6 hours PRN  . irbesartan (AVAPRO) 150 mg, Oral, Daily  . potassium chloride (K-DUR) 10 MEQ tablet 20 mEq, Oral, Daily  . SUMAtriptan (IMITREX) 25 MG tablet Take 1 tablet at the start of the headache PO.  May repeat in 2 hours if no improvement.  . topiramate (TOPAMAX) 25 MG tablet 1 tab PO QHS x 1 week then increase to 2 tabs PO QHS  . traZODone (DESYREL) 25-50 mg, Oral, At bedtime PRN    Observations/Objective: No direct observation done as this was a telephone encounter.  She does not sound dyspneic.  Assessment and Plan: 1. Viral illness -Concerning that she has had the symptoms  for a month with no improvement and has had significant weight loss in the process. -Differential diagnoses include Covid infection vs influenza versus GI illness. -I think she needs to have repeat Covid testing and influenza test. -I have recommended that she be seen in the acute respiratory clinic today -Other labs that I think would be worth checking are CBC, CMP HIV, TSH, stools for C. difficile.  Hopefully they will draw these labs there at the respiratory clinic -I will complete FMLA form to keep her out of work for an additional 2  weeks.  Hopefully she would have had some improvement by then  2. Weight loss -Likely due to #1. Please see plan discussed under #1   Follow Up Instructions: 2-3 wks   I discussed the assessment and treatment plan with the patient. The patient was provided an opportunity to ask questions and all were answered. The patient agreed with the plan and demonstrated an understanding of the instructions.   The patient was advised to call back or seek an in-person evaluation if the symptoms worsen or if the condition fails to improve as anticipated.  I provided 18 minutes of non-face-to-face time during this encounter.   Karle Plumber, MD

## 2019-09-08 NOTE — Progress Notes (Signed)
Pt states every time she coughs her chest hurts  Pt states she is having lose stools  Pt states her symtpoms ahs been goinv on since Aug 12, 2019.   Pt states she hasn't been to work   Pt states she is losing sufficient weight. Her weight now is 213.4.

## 2019-09-08 NOTE — Progress Notes (Signed)
Patient ID: [name], female    DOB: [birth year], 54 y.o.   MRN: [id number]  PCP: Ladell Pier, MD  No chief complaint on file.   Subjective:  HPI Monique Aguilar is a 54 y.o. female presents to [facility] Respiratory clinic for evaluation of symptoms related to concerning for possible COVID-19 infection.   Current symptoms include unintentional weight loss, persistent nausea with vomiting, elevated BP, and low grade fever. Patient has tested negative for COVID-19 on 08/09/19.  She has remained out of work since symptoms started 08/12/19. Had a telemedicine visit with PCP that recommended evaluation here today. Patient afebrile today. Endorses occasional shortness of breath and has chronic HA.   High Risk COVID complications include: Hypertension, Obesity, CHF   Review of Systems Pertinent negatives listed in HPI  Patient Active Problem List   Diagnosis Date Noted  . Stress incontinence 10/08/2018  . Food allergy 10/08/2018  . Left breast mass 08/15/2016  . Migraine without aura and without status migrainosus, not intractable 08/15/2016  . Anomalous right coronary artery   . Chronic diastolic congestive heart failure ([place])   . Hyperlipidemia 05/31/2014  . Family history of premature coronary artery disease 05/31/2014  . Morbid obesity ([place]) 05/31/2014  . HTN (hypertension) 10/11/2013  . Microcytic anemia 12/16/2012  . Renal insufficiency 12/16/2012      Prior to Admission medications   Medication Sig Start Date End Date Taking? Authorizing Provider  calcium carbonate (TUMS - DOSED IN MG ELEMENTAL CALCIUM) 500 MG chewable tablet Chew 1 tablet by mouth as needed for indigestion or heartburn.   Yes [provider]  carvedilol (COREG) 12.5 MG tablet Take 1 tablet (12.5 mg total) by mouth 2 (two) times daily with a meal. 06/01/19  Yes Imogene Burn, PA-C  furosemide (LASIX) 40 MG tablet Take 2 tablets (80 mg total) by mouth daily. 11/03/18  Yes Simmons, Brittainy M, PA-C   ibuprofen (ADVIL) 200 MG tablet Take 400 mg by mouth every 6 (six) hours as needed for moderate pain.   Yes [provider]  irbesartan (AVAPRO) 150 MG tablet Take 1 tablet (150 mg total) by mouth daily. 06/01/19  Yes Imogene Burn, PA-C  potassium chloride (K-DUR) 10 MEQ tablet Take 2 tablets (20 mEq total) by mouth daily. 11/03/18  Yes Lyda Jester M, PA-C  SUMAtriptan (IMITREX) 25 MG tablet Take 1 tablet at the start of the headache PO.  May repeat in 2 hours if no improvement. Patient taking differently: Take 25 mg by mouth See admin instructions. Take 1 tablet at the start of the headache PO.  May repeat in 2 hours if no improvement. 10/08/18  Yes Ladell Pier, MD  topiramate (TOPAMAX) 25 MG tablet 1 tab PO QHS x 1 week then increase to 2 tabs PO QHS 06/22/19  Yes Ladell Pier, MD  traZODone (DESYREL) 50 MG tablet Take 0.5-1 tablets (25-50 mg total) by mouth at bedtime as needed for sleep. 08/19/19  Yes Ladell Pier, MD    Past Medical, Surgical Family and Social History reviewed and updated.    Objective:   Today's Vitals   09/08/19 1805  BP: (!) 150/90  Pulse: 80  Temp: 98.4 F (36.9 C)  SpO2: 99%  Weight: 216 lb 3.2 oz (98.1 kg)    Wt Readings from Last 3 Encounters:  09/08/19 216 lb 3.2 oz (98.1 kg)  06/01/19 230 lb (104.3 kg)  11/26/18 243 lb 6.4 oz (110.4 kg)  Physical Exam General appearance: alert, well developed, well nourished, cooperative and in no distress Head: Normocephalic, without obvious abnormality, atraumatic Respiratory: Respirations even and unlabored, normal respiratory rate Heart: rate and rhythm normal. No gallop or murmurs noted on exam  Abdomen: BS +, no distention, no rebound tenderness, or no mass Extremities: No gross deformities Skin: Skin color, texture, turgor normal. No rashes seen  Psych: Appropriate mood and affect. Neurologic: Alert, oriented to person, place, and time, thought content  appropriate.     Assessment & Plan:  1. Encounter for laboratory testing for COVID-19 virus - Novel Coronavirus, NAA ([facility])  2. CHF (congestive heart failure), NYHA class I, chronic, diastolic (HCC) - Comprehensive metabolic panel - DG Chest 2 View; Future, obtain at [facility] , rule out  pulmonary edema and or changes cardiac silhouette  -Experiencing an intermittent cough-Promethazine DM PRN prescribed.   3. Nausea and vomiting, intractability of vomiting not specified, unspecified vomiting type - Comprehensive metabolic panel - CBC with Differential - Ambulatory referral to Gastroenterology  -Zofran PRN nausea  -Reglan PRN nausea  Dicyclomine PRN for loose stool      4. Body aches - CBC with Differential -COVID-19 test pending -Take tylenol PRN for pain   5. Shortness of breath - DG Chest 2 View; Future, pending  -  6. Unintentional weight loss, unknown cause -Overdue for colonoscopy , age >72 -Pt reports intolerance to colon prep. -Recommended referral to GI with negative COVID test  - Ambulatory referral to Gastroenterology  7. Diarrhea, unspecified type   -Dicyclomine PRN    Referral to GI if labs and COVID test are negative  Meds ordered this encounter  Medications  . metoCLOPramide (REGLAN) 10 MG tablet    Sig: Take 1 tablet (10 mg total) by mouth every 6 (six) hours as needed for nausea or vomiting.    Dispense:  30 tablet    Refill:  0  . dicyclomine (BENTYL) 10 MG capsule    Sig: Take 1 capsule (10 mg total) by mouth 3 (three) times daily as needed for spasms.    Dispense:  60 capsule    Refill:  0  . promethazine-dextromethorphan (PROMETHAZINE-DM) 6.25-15 MG/5ML syrup    Sig: Take 5 mLs by mouth 4 (four) times daily as needed for cough.    Dispense:  140 mL    Refill:  0    -The patient was given clear instructions to go to ER or return to medical center if symptoms do not improve, worsen or new problems develop. The patient  verbalized understanding.     Joaquin Courts, FNP-C [facility], PRN Provider  [facility] [place], [place]  Clinic Phone: [phone] Clinic Fax: [phone] Clinic Hours: 5:30 pm -7:30 pm (Monday-Friday)

## 2019-09-08 NOTE — Patient Instructions (Addendum)
For x-ray imaging:  8:00-4:30 PM [facility]  [address] [place], [place], [place] [address] Enter through Main Entrance and request x-ray department. You will be contacted either via phone or via Mychart with your x-ray result.  I will notify you via Mychart of your Labwork and COVID-19 test. If COVID-19 test is negative, I will place a referral to GI to evaluate GI symptoms and unintentional weight loss.     Heart Failure, Diagnosis  Heart failure means that your heart is not able to pump blood in the right way. This makes it hard for your body to work well. Heart failure is usually a long-term (chronic) condition. You must take good care of yourself and follow your treatment plan from your doctor. What are the causes? This condition may be caused by:  High blood pressure.  Build up of cholesterol and fat in the arteries.  Heart attack. This injures the heart muscle.  Heart valves that do not open and close properly.  Damage of the heart muscle. This is also called cardiomyopathy.  Lung disease.  Abnormal heart rhythms. What increases the risk? The risk of heart failure goes up as a person ages. This condition is also more likely to develop in people who:  Are overweight.  Are female.  Smoke or chew tobacco.  Abuse alcohol or illegal drugs.  Have taken medicines that can damage the heart.  Have diabetes.  Have abnormal heart rhythms.  Have thyroid problems.  Have low blood counts (anemia). What are the signs or symptoms? Symptoms of this condition include:  Shortness of breath.  Coughing.  Swelling of the feet, ankles, legs, or belly.  Losing weight for no reason.  Trouble breathing.  Waking from sleep because of the need to sit up and get more air.  Rapid heartbeat.  Being very tired.  Feeling dizzy, or feeling like you may pass out (faint).  Having no desire to eat.  Feeling like you may vomit (nauseous).  Peeing (urinating) more at  night.  Feeling confused. How is this treated?     This condition may be treated with:  Medicines. These can be given to treat blood pressure and to make the heart muscles stronger.  Changes in your daily life. These may include eating a healthy diet, staying at a healthy body weight, quitting tobacco and illegal drug use, or doing exercises.  Surgery. Surgery can be done to open blocked valves, or to put devices in the heart, such as pacemakers.  A donor heart (heart transplant). You will receive a healthy heart from a donor. Follow these instructions at home:  Treat other conditions as told by your doctor. These may include high blood pressure, diabetes, thyroid disease, or abnormal heart rhythms.  Learn as much as you can about heart failure.  Get support as you need it.  Keep all follow-up visits as told by your doctor. This is important. Summary  Heart failure means that your heart is not able to pump blood in the right way.  This condition is caused by high blood pressure, heart attack, or damage of the heart muscle.  Symptoms of this condition include shortness of breath and swelling of the feet, ankles, legs, or belly. You may also feel very tired or feel like you may vomit.  You may be treated with medicines, surgery, or changes in your daily life.  Treat other health conditions as told by your doctor. This information is not intended to replace advice given  to you by your health care provider. Make sure you discuss any questions you have with your health care provider. Document Revised: 10/08/2018 Document Reviewed: 10/08/2018 Elsevier Patient Education  2020 Elsevier Inc.       Nausea and Vomiting, Adult Nausea is feeling sick to your stomach or feeling that you are about to throw up (vomit). Vomiting is when food in your stomach is thrown up and out of the mouth. Throwing up can make you feel weak. It can also make you lose too much water in your body (get  dehydrated). If you lose too much water in your body, you may:  Feel tired.  Feel thirsty.  Have a dry mouth.  Have cracked lips.  Go pee (urinate) less often. Older adults and people with other diseases or a weak body defense system (immune system) are at higher risk for losing too much water in the body. If you feel sick to your stomach and you throw up, it is important to follow instructions from your doctor about how to take care of yourself. Follow these instructions at home: Watch your symptoms for any changes. Tell your doctor about them. Follow these instructions to care for yourself at home. Eating and drinking      Take an ORS (oral rehydration solution). This is a drink that is sold at pharmacies and stores.  Drink clear fluids in small amounts as you are able, such as: ? Water. ? Ice chips. ? Fruit juice that has water added (diluted fruit juice). ? Low-calorie sports drinks.  Eat bland, easy-to-digest foods in small amounts as you are able, such as: ? Bananas. ? Applesauce. ? Rice. ? Low-fat (lean) meats. ? Toast. ? Crackers.  Avoid drinking fluids that have a lot of sugar or caffeine in them. This includes energy drinks, sports drinks, and soda.  Avoid alcohol.  Avoid spicy or fatty foods. General instructions  Take over-the-counter and prescription medicines only as told by your doctor.  Drink enough fluid to keep your pee (urine) pale yellow.  Wash your hands often with soap and water. If you cannot use soap and water, use hand sanitizer.  Make sure that all people in your home wash their hands well and often.  Rest at home while you get better.  Watch your condition for any changes.  Take slow and deep breaths when you feel sick to your stomach.  Keep all follow-up visits as told by your doctor. This is important. Contact a doctor if:  Your symptoms get worse.  You have new symptoms.  You have a fever.  You cannot drink fluids without  throwing up.  You feel sick to your stomach for more than 2 days.  You feel light-headed or dizzy.  You have a headache.  You have muscle cramps.  You have a rash.  You have pain while peeing. Get help right away if:  You have pain in your chest, neck, arm, or jaw.  You feel very weak or you pass out (faint).  You throw up again and again.  You have throw up that is bright red or looks like black coffee grounds.  You have bloody or black poop (stools) or poop that looks like tar.  You have a very bad headache, a stiff neck, or both.  You have very bad pain, cramping, or bloating in your belly (abdomen).  You have trouble breathing.  You are breathing very quickly.  Your heart is beating very quickly.  Your skin feels  cold and clammy.  You feel confused.  You have signs of losing too much water in your body, such as: ? Dark pee, very little pee, or no pee. ? Cracked lips. ? Dry mouth. ? Sunken eyes. ? Sleepiness. ? Weakness. These symptoms may be an emergency. Do not wait to see if the symptoms will go away. Get medical help right away. Call your local emergency services (911 in the [place] Do not drive yourself to the hospital. Summary  Nausea is feeling sick to your stomach or feeling that you are about to throw up (vomit). Vomiting is when food in your stomach is thrown up and out of the mouth.  Follow instructions from your doctor about eating and drinking to keep from losing too much water in your body.  Take over-the-counter and prescription medicines only as told by your doctor.  Contact your doctor if your symptoms get worse or you have new symptoms.  Keep all follow-up visits as told by your doctor. This is important. This information is not intended to replace advice given to you by your health care provider. Make sure you discuss any questions you have with your health care provider. Document Revised: 11/12/2018 Document Reviewed:  12/29/2017 Elsevier Patient Education  [employer].

## 2019-09-09 LAB — COMPREHENSIVE METABOLIC PANEL
ALT: 21 IU/L (ref 0–32)
AST: 28 IU/L (ref 0–40)
Albumin/Globulin Ratio: 1.5 (ref 1.2–2.2)
Albumin: 4.4 g/dL (ref 3.8–4.9)
Alkaline Phosphatase: 70 IU/L (ref 39–117)
BUN/Creatinine Ratio: 10 (ref 9–23)
BUN: 11 mg/dL (ref 6–24)
Bilirubin Total: 0.3 mg/dL (ref 0.0–1.2)
CO2: 23 mmol/L (ref 20–29)
Calcium: 10.1 mg/dL (ref 8.7–10.2)
Chloride: 107 mmol/L — ABNORMAL HIGH (ref 96–106)
Creatinine, Ser: 1.07 mg/dL — ABNORMAL HIGH (ref 0.57–1.00)
GFR calc Af Amer: 68 mL/min/{1.73_m2} (ref >59)
GFR calc non Af Amer: 59 mL/min/{1.73_m2} — ABNORMAL LOW (ref >59)
Globulin, Total: 3 g/dL (ref 1.5–4.5)
Glucose: 83 mg/dL (ref 65–99)
Potassium: 4.2 mmol/L (ref 3.5–5.2)
Sodium: 144 mmol/L (ref 134–144)
Total Protein: 7.4 g/dL (ref 6.0–8.5)

## 2019-09-09 LAB — CBC WITH DIFFERENTIAL/PLATELET
Basophils Absolute: 0 10*3/uL (ref 0.0–0.2)
Basos: 1 %
EOS (ABSOLUTE): 0.1 10*3/uL (ref 0.0–0.4)
Eos: 2 %
Hematocrit: 36 % (ref 34.0–46.6)
Hemoglobin: 12 g/dL (ref 11.1–15.9)
Immature Grans (Abs): 0 10*3/uL (ref 0.0–0.1)
Immature Granulocytes: 0 %
Lymphocytes Absolute: 3.1 10*3/uL (ref 0.7–3.1)
Lymphs: 53 %
MCH: 26.6 pg (ref 26.6–33.0)
MCHC: 33.3 g/dL (ref 31.5–35.7)
MCV: 80 fL (ref 79–97)
Monocytes Absolute: 0.5 10*3/uL (ref 0.1–0.9)
Monocytes: 9 %
Neutrophils Absolute: 2 10*3/uL (ref 1.4–7.0)
Neutrophils: 35 %
Platelets: 313 10*3/uL (ref 150–450)
RBC: 4.51 x10E6/uL (ref 3.77–5.28)
RDW: 14.2 % (ref 11.7–15.4)
WBC: 5.7 10*3/uL (ref 3.4–10.8)

## 2019-09-10 LAB — NOVEL CORONAVIRUS, NAA: SARS-CoV-2, NAA: NOT DETECTED

## 2019-09-11 ENCOUNTER — Telehealth: Payer: Self-pay | Admitting: Internal Medicine

## 2019-09-11 DIAGNOSIS — R634 Abnormal weight loss: Secondary | ICD-10-CM

## 2019-09-11 DIAGNOSIS — R197 Diarrhea, unspecified: Secondary | ICD-10-CM

## 2019-09-13 ENCOUNTER — Ambulatory Visit: Payer: BC Managed Care – PPO | Admitting: Internal Medicine

## 2019-09-21 NOTE — Telephone Encounter (Signed)
I was not able to get in contact with her when I tried to contact her

## 2019-09-21 NOTE — Telephone Encounter (Signed)
Will forward to pcp

## 2019-10-03 ENCOUNTER — Encounter: Payer: Self-pay | Admitting: Internal Medicine

## 2019-10-04 ENCOUNTER — Telehealth: Payer: Self-pay

## 2019-10-04 NOTE — Telephone Encounter (Signed)
Contacted pt to schedule an in person visit pt didn't answer lvm informing pt to give a call back to schedule at her earliest convenience

## 2019-11-10 ENCOUNTER — Encounter: Payer: Self-pay | Admitting: Family Medicine

## 2019-12-08 ENCOUNTER — Other Ambulatory Visit: Payer: Self-pay

## 2019-12-08 ENCOUNTER — Ambulatory Visit: Payer: Self-pay | Attending: Internal Medicine | Admitting: Internal Medicine

## 2019-12-08 DIAGNOSIS — Z91018 Allergy to other foods: Secondary | ICD-10-CM

## 2019-12-08 MED ORDER — EPINEPHRINE 0.3 MG/0.3ML IJ SOAJ: 0.3000 mg | INTRAMUSCULAR | 1 refills | Status: DC | PRN

## 2019-12-08 NOTE — Progress Notes (Signed)
Virtual Visit via Telephone Note  I connected with Monique Aguilar on 12/08/19 at 4:32 p.m by telephone and verified that I am speaking with the correct person using two identifiers.   I discussed the limitations, risks, security and privacy concerns of performing an evaluation and management service by telephone and the availability of in person appointments. I also discussed with the patient that there may be a patient responsible charge related to this service. The patient expressed understanding and agreed to proceed.   History of Present Illness: Pt with hx of HTN, dCHF,anomalous RCA,anemia, migraines, obesity, HL, fhx of CAD  Pt needed an Epi-pen.  Given one last year by the allergist as patient has food allergies.  She reports no other concerns today  Current Outpatient Medications on File Prior to Visit  Medication Sig Dispense Refill  . carvedilol (COREG) 12.5 MG tablet Take 1 tablet (12.5 mg total) by mouth 2 (two) times daily with a meal. 180 tablet 3  . calcium carbonate (TUMS - DOSED IN MG ELEMENTAL CALCIUM) 500 MG chewable tablet Chew 1 tablet by mouth as needed for indigestion or heartburn.    . dicyclomine (BENTYL) 10 MG capsule Take 1 capsule (10 mg total) by mouth 3 (three) times daily as needed for spasms. (Patient not taking: Reported on 12/08/2019) 60 capsule 0  . furosemide (LASIX) 40 MG tablet Take 2 tablets (80 mg total) by mouth daily. (Patient not taking: Reported on 12/08/2019) 90 tablet 3  . ibuprofen (ADVIL) 200 MG tablet Take 400 mg by mouth every 6 (six) hours as needed for moderate pain.    [name] irbesartan (AVAPRO) 150 MG tablet Take 1 tablet (150 mg total) by mouth daily. (Patient not taking: Reported on 12/08/2019) 90 tablet 3  . metoCLOPramide (REGLAN) 10 MG tablet Take 1 tablet (10 mg total) by mouth every 6 (six) hours as needed for nausea or vomiting. (Patient not taking: Reported on 12/08/2019) 30 tablet 0  . potassium chloride (K-DUR) 10 MEQ tablet Take 2 tablets (20  mEq total) by mouth daily. (Patient not taking: Reported on 12/08/2019) 90 tablet 3  . promethazine-dextromethorphan (PROMETHAZINE-DM) 6.25-15 MG/5ML syrup Take 5 mLs by mouth 4 (four) times daily as needed for cough. (Patient not taking: Reported on 12/08/2019) 140 mL 0  . SUMAtriptan (IMITREX) 25 MG tablet Take 1 tablet at the start of the headache PO.  May repeat in 2 hours if no improvement. (Patient taking differently: Take 25 mg by mouth See admin instructions. Take 1 tablet at the start of the headache PO.  May repeat in 2 hours if no improvement.) 10 tablet 1  . topiramate (TOPAMAX) 25 MG tablet 1 tab PO QHS x 1 week then increase to 2 tabs PO QHS (Patient not taking: Reported on 12/08/2019) 60 tablet 1  . traZODone (DESYREL) 50 MG tablet Take 0.5-1 tablets (25-50 mg total) by mouth at bedtime as needed for sleep. (Patient not taking: Reported on 12/08/2019) 30 tablet 3   No current facility-administered medications on file prior to visit.  .   Observations/Objective: No direct observation done as this was a telephone encounter  Assessment and Plan: 1. Food allergy  - EPINEPHrine 0.3 mg/0.3 mL IJ SOAJ injection; Inject 0.3 mLs (0.3 mg total) into the muscle as needed for anaphylaxis.  Dispense: 2 each; Refill: 1  Follow Up Instructions: PRN   I discussed the assessment and treatment plan with the patient. The patient was provided an opportunity to ask questions and all were  answered. The patient agreed with the plan and demonstrated an understanding of the instructions.   The patient was advised to call back or seek an in-person evaluation if the symptoms worsen or if the condition fails to improve as anticipated.  I provided 6 minutes of non-face-to-face time during this encounter.   Jonah Blue, MD

## 2019-12-09 ENCOUNTER — Ambulatory Visit: Payer: Self-pay | Admitting: Family Medicine

## 2019-12-09 NOTE — Progress Notes (Deleted)
   [address] [place] [address] Dept: [phone]  FOLLOW UP NOTE  Patient ID: Monique Aguilar, female    DOB: [birth year]  Age: 54 y.o. MRN: [id number] Date of Office Visit: 12/09/2019  Assessment  Chief Complaint: No chief complaint on file.  HPI Monique Aguilar    Drug Allergies:  Allergies  Allergen Reactions  . Fruit & Vegetable Daily [Nutritional Supplements] Anaphylaxis    Strawberries or other fruit  . Grass Extracts [Gramineae Pollens] Other (See Comments)    Watery eyes, sneezing    Physical Exam: There were no vitals taken for this visit.   Physical Exam  Diagnostics:    Assessment and Plan: No diagnosis found.  No orders of the defined types were placed in this encounter.   There are no Patient Instructions on file for this visit.  No follow-ups on file.    Thank you for the opportunity to care for this patient.  Please do not hesitate to contact me with questions.  Thermon Leyland, FNP [facility]

## 2019-12-12 NOTE — Progress Notes (Signed)
Virtual Visit via Video Note   This visit type was conducted due to national recommendations for restrictions regarding the COVID-19 Pandemic (e.g. social distancing) in an effort to limit this patient's exposure and mitigate transmission in our community.  Due to her co-morbid illnesses, this patient is at least at moderate risk for complications without adequate follow up.  This format is felt to be most appropriate for this patient at this time.  All issues noted in this document were discussed and addressed.  A limited physical exam was performed with this format.  Please refer to the patient's chart for her consent to telehealth for [facility].   The patient was identified using 2 identifiers.  Date:  12/13/2019   ID:  Monique Aguilar, DOB June 26, 1966, MRN [id number]  Patient Location: Home Provider Location: Home  PCP:  Marcine Matar, MD  Cardiologist:  Tobias Alexander, MD   Electrophysiologist:  None   Evaluation Performed:  Follow-Up Visit  Chief Complaint:  Follow up  History of Present Illness:    Monique Aguilar is a 54 y.o. female with  hx of atypical chest pain and dyspnea. 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 The patient was advised to undergo an exercise treadmill stress test to assess for exertional ischemia from systolic inter-arterial compression. This was performed and showed no signs of ischemia. She also had normal functional capacity on pulmonary exercise testing. She is followed by Dr. Cornelius Moras, chronic diastolic CHF, HTN, and obesity.    I had telemedicine visit with the patient 05/2019 which time she said edema is up and down she was working 2 jobs as a Lawyer.  Watches her salt for the most part but was eating canned foods.  Blood pressure was running high.  I increased her carvedilol to 12.5 mg  twice daily.  I also ordered an echo which has not been done and referred her to the weight loss center.  Patient is doing well. She has lost 15 lbs by cutting out processed foods and reduced salt. Walking some but no regular exercise. Still working at [employer]. Has received first covid 19 vaccine. BP has been running 124/72-76 since diet changes. No chest pain, dyspnea, edema.  The patient does not have symptoms concerning for COVID-19 infection (fever, chills, cough, or new shortness of breath).    Past Medical History:  Diagnosis Date  . Abnormal CT of the chest    a. 10/2013: 5mm RML pulm nodule, axillary lymphadenopathy. Instructed to f/u PCP.  [name] Anomalous right coronary artery   . Atypical chest pain    a. 10/2013: suspected GI etiology. Nuc - nonischemic, EF 39% but visually higher - f/u echo to clarify showed EF 50-55% with only mild LVH, no significant valvuar disease.  . Chronic diastolic congestive heart failure (HCC)   . HTN (hypertension)   . Low back pain   . Microcytic anemia    a. No prior workup.  [name] Migraines    Past Surgical History:  Procedure Laterality Date  . BREAST CYST ASPIRATION    . CESAREAN SECTION  1988  . TONSILLECTOMY  1992?  . TUBAL LIGATION  1995  . WISDOM TOOTH EXTRACTION  1995     Current Meds  Medication Sig  . calcium carbonate (TUMS - DOSED IN MG ELEMENTAL CALCIUM) 500 MG  chewable tablet Chew 1 tablet by mouth as needed for indigestion or heartburn.  . carvedilol (COREG) 12.5 MG tablet Take 1 tablet (12.5 mg total) by mouth 2 (two) times daily with a meal.  . EPINEPHrine 0.3 mg/0.3 mL IJ SOAJ injection Inject 0.3 mLs (0.3 mg total) into the muscle as needed for anaphylaxis.  . furosemide (LASIX) 40 MG tablet Take 2 tablets (80 mg total) by mouth daily.  [name] ibuprofen (ADVIL) 200 MG tablet Take 400 mg by mouth every 6 (six) hours as needed for moderate pain.  [name] irbesartan (AVAPRO) 150 MG tablet Take 1 tablet (150 mg total) by mouth daily.  .  potassium chloride (K-DUR) 10 MEQ tablet Take 2 tablets (20 mEq total) by mouth daily.  . SUMAtriptan (IMITREX) 25 MG tablet Take 1 tablet at the start of the headache PO.  May repeat in 2 hours if no improvement. (Patient taking differently: Take 25 mg by mouth See admin instructions. Take 1 tablet at the start of the headache PO.  May repeat in 2 hours if no improvement.)     Allergies:   Fruit & vegetable daily [nutritional supplements] and Grass extracts [gramineae pollens]   Social History   Tobacco Use  . Smoking status: Never Smoker  . Smokeless tobacco: Never Used  Substance Use Topics  . Alcohol use: No  . Drug use: No     Family Hx: The patient's family history includes Congestive Heart Failure in her mother; Heart attack (age of onset: 22) in her father; Heart disease in her father. There is no history of CAD, Colon cancer, Colon polyps, Esophageal cancer, Stomach cancer, or Rectal cancer.  ROS:   Please see the history of present illness.      All other systems reviewed and are negative.   Prior CV studies:   The following studies were reviewed today:  Echo 2016Study Conclusions   - Left ventricle: The cavity size was normal. Wall thickness was   normal. Systolic function was normal. The estimated ejection   fraction was in the range of 55% to 60%. Wall motion was normal;   there were no regional wall motion abnormalities. Left   ventricular diastolic function parameters were normal.     Labs/Other Tests and Data Reviewed:    EKG:  No ECG reviewed.  Recent Labs: 06/20/2019: B Natriuretic Peptide 17.4 09/08/2019: ALT 21; BUN 11; Creatinine, Ser 1.07; Hemoglobin 12.0; Platelets 313; Potassium 4.2; Sodium 144   Recent Lipid Panel Lab Results  Component Value Date/Time   CHOL 180 04/02/2018 04:01 PM   TRIG 94 04/02/2018 04:01 PM   HDL 60 04/02/2018 04:01 PM   CHOLHDL 3.0 04/02/2018 04:01 PM   CHOLHDL 3.1 10/11/2013 01:25 AM   LDLCALC 101 (H) 04/02/2018  04:01 PM    Wt Readings from Last 3 Encounters:  12/13/19 215 lb (97.5 kg)  09/08/19 216 lb 3.2 oz (98.1 kg)  06/01/19 230 lb (104.3 kg)     Objective:    Vital Signs:  BP (!) 144/76   Pulse 70   Ht 5\' 6"  (1.676 m)   Wt 215 lb (97.5 kg)   BMI 34.70 kg/m    VITAL SIGNS:  reviewed  ASSESSMENT & PLAN:    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 This was performed and showed no  signs of ischemia. She also had normal functional capacity on pulmonary exercise testing. She is followed by Dr. Cornelius Moras  Chronic diastolic CHF Echo ordered 2020 but not done. Edema has resolved with dietary changes.   Essential hypertension BP doing much better since reduced sodium in her diet and increased carvedilol  Obesity-has lost 15 lbs since I saw her last. Recommend regular exercise.   COVID-19 Education: The signs and symptoms of COVID-19 were discussed with the patient and how to seek care for testing (follow up with PCP or arrange E-visit).   The importance of social distancing was discussed today.  Time:   Today, I have spent 8 minutes with the patient with telehealth technology discussing the above problems.     Medication Adjustments/Labs and Tests Ordered: Current medicines are reviewed at length with the patient today.  Concerns regarding medicines are outlined above.   Tests Ordered: No orders of the defined types were placed in this encounter.   Medication Changes: No orders of the defined types were placed in this encounter.   Follow Up:  Either In Person or Virtual in 6 month(s) Dr. Delton See  Signed, Jacolyn Reedy, PA-C  12/13/2019 10:32 AM    [facility]

## 2019-12-13 ENCOUNTER — Telehealth (INDEPENDENT_AMBULATORY_CARE_PROVIDER_SITE_OTHER): Payer: Self-pay | Admitting: Physician Assistant

## 2019-12-13 ENCOUNTER — Encounter: Payer: Self-pay | Admitting: Physician Assistant

## 2019-12-13 ENCOUNTER — Other Ambulatory Visit: Payer: Self-pay

## 2019-12-13 VITALS — BP 144/76 | HR 70 | Ht 66.0 in | Wt 215.0 lb

## 2019-12-13 DIAGNOSIS — I110 Hypertensive heart disease with heart failure: Secondary | ICD-10-CM

## 2019-12-13 DIAGNOSIS — I10 Essential (primary) hypertension: Secondary | ICD-10-CM

## 2019-12-13 DIAGNOSIS — Q245 Malformation of coronary vessels: Secondary | ICD-10-CM

## 2019-12-13 DIAGNOSIS — I5032 Chronic diastolic (congestive) heart failure: Secondary | ICD-10-CM

## 2019-12-13 NOTE — Patient Instructions (Signed)
Medication Instructions:  Your physician recommends that you continue on your current medications as directed. Please refer to the Current Medication list given to you today.  *If you need a refill on your cardiac medications before your next appointment, please call your pharmacy*   Lab Work: None ordered  If you have labs (blood work) drawn today and your tests are completely normal, you will receive your results only by: [name] MyChart Message (if you have MyChart) OR . A paper copy in the mail If you have any lab test that is abnormal or we need to change your treatment, we will call you to review the results.   Testing/Procedures: None ordered   Follow-Up: At [facility], you and your health needs are our priority.  As part of our continuing mission to provide you with exceptional heart care, we have created designated Provider Care Teams.  These Care Teams include your primary Cardiologist (physician) and Advanced Practice Providers (APPs -  Physician Assistants and Nurse Practitioners) who all work together to provide you with the care you need, when you need it.  We recommend signing up for the patient portal called "MyChart".  Sign up information is provided on this After Visit Summary.  MyChart is used to connect with patients for Virtual Visits (Telemedicine).  Patients are able to view lab/test results, encounter notes, upcoming appointments, etc.  Non-urgent messages can be sent to your provider as well.   To learn more about what you can do with MyChart, go to [email or web address].    Your next appointment:   6 month(s)  The format for your next appointment:   In Person  Provider:   You may see Tobias Alexander, MD or one of the following Advanced Practice Providers on your designated Care Team:    Ronie Spies, PA-C  Jacolyn Reedy, PA-C    Other Instructions Your provider recommends that you maintain 150 minutes per week of moderate aerobic activity.  Your  physician encouraged you to lose weight for better health.

## 2020-02-07 ENCOUNTER — Encounter: Payer: Self-pay | Admitting: Internal Medicine

## 2020-02-07 DIAGNOSIS — I10 Essential (primary) hypertension: Secondary | ICD-10-CM

## 2020-02-07 MED ORDER — CARVEDILOL 12.5 MG PO TABS: 12.5000 mg | ORAL_TABLET | Freq: Two times a day (BID) | ORAL | 0 refills | Status: DC

## 2020-02-07 MED ORDER — IRBESARTAN 150 MG PO TABS: 150.0000 mg | ORAL_TABLET | Freq: Every day | ORAL | 0 refills | Status: DC

## 2020-02-22 ENCOUNTER — Encounter: Payer: Self-pay | Admitting: Internal Medicine

## 2020-02-23 ENCOUNTER — Telehealth: Payer: Self-pay

## 2020-02-23 NOTE — Telephone Encounter (Signed)
Received via fax [employer] Short Term Disability forms for provider to complete & fax to number on front of forms:  [phone]. Placed forms in provider's in-box.

## 2020-02-24 ENCOUNTER — Telehealth: Payer: Self-pay | Admitting: Internal Medicine

## 2020-02-24 NOTE — Telephone Encounter (Signed)
Returned call to patient and scheduled an appt for 03/16/20. Patient stated her PCP wanted her to be seen sooner. Please f/u

## 2020-02-28 ENCOUNTER — Telehealth: Payer: Self-pay

## 2020-02-28 NOTE — Telephone Encounter (Signed)
Received via fax Short Term Disability forms [employer] for provider to complete physician stmt of impairment & function & fax to [facility] [phone].

## 2020-02-28 NOTE — Telephone Encounter (Signed)
Tired to contact pt and no vm available

## 2020-03-15 ENCOUNTER — Encounter: Payer: Self-pay | Admitting: Internal Medicine

## 2020-03-16 ENCOUNTER — Other Ambulatory Visit: Payer: Self-pay

## 2020-03-16 ENCOUNTER — Ambulatory Visit: Payer: Self-pay | Admitting: Internal Medicine

## 2020-03-16 ENCOUNTER — Encounter (HOSPITAL_COMMUNITY): Payer: Self-pay

## 2020-03-16 ENCOUNTER — Ambulatory Visit (HOSPITAL_COMMUNITY)
Admission: EM | Admit: 2020-03-16 | Discharge: 2020-03-16 | Disposition: A | Discharge status: Home / Self Care | Payer: BC Managed Care – PPO | Attending: Urgent Care | Admitting: Urgent Care

## 2020-03-16 ENCOUNTER — Ambulatory Visit (INDEPENDENT_AMBULATORY_CARE_PROVIDER_SITE_OTHER): Payer: BC Managed Care – PPO

## 2020-03-16 DIAGNOSIS — I5032 Chronic diastolic (congestive) heart failure: Secondary | ICD-10-CM | POA: Insufficient documentation

## 2020-03-16 DIAGNOSIS — Z20822 Contact with and (suspected) exposure to covid-19: Secondary | ICD-10-CM | POA: Insufficient documentation

## 2020-03-16 DIAGNOSIS — R5381 Other malaise: Secondary | ICD-10-CM | POA: Diagnosis not present

## 2020-03-16 DIAGNOSIS — J069 Acute upper respiratory infection, unspecified: Secondary | ICD-10-CM

## 2020-03-16 DIAGNOSIS — Z79899 Other long term (current) drug therapy: Secondary | ICD-10-CM | POA: Insufficient documentation

## 2020-03-16 DIAGNOSIS — I110 Hypertensive heart disease with heart failure: Secondary | ICD-10-CM | POA: Insufficient documentation

## 2020-03-16 DIAGNOSIS — R0600 Dyspnea, unspecified: Secondary | ICD-10-CM | POA: Diagnosis not present

## 2020-03-16 DIAGNOSIS — R519 Headache, unspecified: Secondary | ICD-10-CM | POA: Insufficient documentation

## 2020-03-16 DIAGNOSIS — R0981 Nasal congestion: Secondary | ICD-10-CM | POA: Diagnosis not present

## 2020-03-16 DIAGNOSIS — R05 Cough: Secondary | ICD-10-CM | POA: Diagnosis not present

## 2020-03-16 DIAGNOSIS — R0602 Shortness of breath: Secondary | ICD-10-CM | POA: Diagnosis not present

## 2020-03-16 MED ORDER — PSEUDOEPHEDRINE HCL 30 MG PO TABS
30.0000 mg | ORAL_TABLET | Freq: Three times a day (TID) | ORAL | 0 refills | Status: DC | PRN
Start: 2020-03-16 — End: 2020-07-09

## 2020-03-16 MED ORDER — PROMETHAZINE-DM 6.25-15 MG/5ML PO SYRP
5.0000 mL | ORAL_SOLUTION | Freq: Every evening | ORAL | 0 refills | Status: DC | PRN
Start: 2020-03-16 — End: 2020-07-09

## 2020-03-16 MED ORDER — BENZONATATE 100 MG PO CAPS: 100.0000 mg | ORAL_CAPSULE | Freq: Three times a day (TID) | ORAL | 0 refills | Status: DC | PRN

## 2020-03-16 NOTE — ED Provider Notes (Signed)
[facility]   MRN: [id number] DOB: 01-27-1966  Subjective:   Monique Aguilar is a 54 y.o. female presenting for 5 day hx of malaise. Patient has been in [place]. Started to feel worse Wednesday when she came back from [place]. Has been vaccinated for COVID 19. Denies smoking hx. Has a hx of CHF.  Has had dyspnea, shortness of breath when laying down at night.  She has close follow-up with her cardiologist with [facility].  She is taking Lasix 80 mg daily.   No current facility-administered medications for this encounter.  Current Outpatient Medications:    calcium carbonate (TUMS - DOSED IN MG ELEMENTAL CALCIUM) 500 MG chewable tablet, Chew 1 tablet by mouth as needed for indigestion or heartburn., Disp: , Rfl:    carvedilol (COREG) 12.5 MG tablet, Take 1 tablet (12.5 mg total) by mouth 2 (two) times daily with a meal., Disp: 180 tablet, Rfl: 0   EPINEPHrine 0.3 mg/0.3 mL IJ SOAJ injection, Inject 0.3 mLs (0.3 mg total) into the muscle as needed for anaphylaxis., Disp: 2 each, Rfl: 1   furosemide (LASIX) 40 MG tablet, Take 2 tablets (80 mg total) by mouth daily., Disp: 90 tablet, Rfl: 3   ibuprofen (ADVIL) 200 MG tablet, Take 400 mg by mouth every 6 (six) hours as needed for moderate pain., Disp: , Rfl:    irbesartan (AVAPRO) 150 MG tablet, Take 1 tablet (150 mg total) by mouth daily., Disp: 90 tablet, Rfl: 0   potassium chloride (K-DUR) 10 MEQ tablet, Take 2 tablets (20 mEq total) by mouth daily., Disp: 90 tablet, Rfl: 3   SUMAtriptan (IMITREX) 25 MG tablet, Take 1 tablet at the start of the headache PO.  May repeat in 2 hours if no improvement. (Patient taking differently: Take 25 mg by mouth See admin instructions. Take 1 tablet at the start of the headache PO.  May repeat in 2 hours if no improvement.), Disp: 10 tablet, Rfl: 1   Allergies  Allergen Reactions   Fruit & Vegetable Daily [Nutritional Supplements] Anaphylaxis    Strawberries or other fruit   Grass  Extracts [Gramineae Pollens] Other (See Comments)    Watery eyes, sneezing    Past Medical History:  Diagnosis Date   Abnormal CT of the chest    a. 10/2013: 67mm RML pulm nodule, axillary lymphadenopathy. Instructed to f/u PCP.   Anomalous right coronary artery    Atypical chest pain    a. 10/2013: suspected GI etiology. Nuc - nonischemic, EF 39% but visually higher - f/u echo to clarify showed EF 50-55% with only mild LVH, no significant valvuar disease.   Chronic diastolic congestive heart failure (HCC)    HTN (hypertension)    Low back pain    Microcytic anemia    a. No prior workup.   Migraines      Past Surgical History:  Procedure Laterality Date   BREAST CYST ASPIRATION     CESAREAN SECTION  1988   TONSILLECTOMY  1992?   TUBAL LIGATION  1995   WISDOM TOOTH EXTRACTION  1995    Family History  Problem Relation Age of Onset   Heart disease Father        Dx early 43's (? heart valve)   Heart attack Father 47   Congestive Heart Failure Mother    CAD Neg Hx    Colon cancer Neg Hx    Colon polyps Neg Hx    Esophageal cancer Neg Hx  Stomach cancer Neg Hx    Rectal cancer Neg Hx     Social History   Tobacco Use   Smoking status: Never Smoker   Smokeless tobacco: Never Used  Vaping Use   Vaping Use: Never used  Substance Use Topics   Alcohol use: No   Drug use: No    Review of Systems  Constitutional: Positive for malaise/fatigue. Negative for fever.  HENT: Positive for congestion and sore throat. Negative for ear pain and sinus pain.   Eyes: Negative for discharge and redness.  Respiratory: Positive for cough and shortness of breath. Negative for hemoptysis and wheezing.   Cardiovascular: Negative for chest pain.  Gastrointestinal: Positive for nausea. Negative for abdominal pain, diarrhea and vomiting.  Genitourinary: Negative for dysuria, flank pain and hematuria.  Musculoskeletal: Negative for myalgias.  Skin: Negative for  rash.  Neurological: Positive for headaches. Negative for dizziness and weakness.  Psychiatric/Behavioral: Negative for depression and substance abuse.     Objective:   Vitals: BP 109/82 (BP Location: Left Arm)    Pulse 85    Temp 99.4 F (37.4 C) (Oral)    Resp (!) 22    SpO2 100%   Physical Exam Constitutional:      General: She is not in acute distress.    Appearance: Normal appearance. She is well-developed. She is not ill-appearing, toxic-appearing or diaphoretic.  HENT:     Head: Normocephalic and atraumatic.     Nose: Nose normal.     Mouth/Throat:     Mouth: Mucous membranes are moist.  Eyes:     Extraocular Movements: Extraocular movements intact.     Pupils: Pupils are equal, round, and reactive to light.  Cardiovascular:     Rate and Rhythm: Normal rate and regular rhythm.     Pulses: Normal pulses.     Heart sounds: Normal heart sounds. No murmur heard.  No friction rub. No gallop.   Pulmonary:     Effort: Pulmonary effort is normal. No respiratory distress.     Breath sounds: Normal breath sounds. No stridor. No wheezing, rhonchi or rales.  Skin:    General: Skin is warm and dry.     Findings: No rash.  Neurological:     Mental Status: She is alert and oriented to person, place, and time.  Psychiatric:        Mood and Affect: Mood normal.        Behavior: Behavior normal.        Thought Content: Thought content normal.     DG Chest 2 View  Result Date: 03/16/2020 CLINICAL DATA:  Cough, shortness of breath EXAM: CHEST - 2 VIEW COMPARISON:  06/20/2019 FINDINGS: The heart size and mediastinal contours are within normal limits. Both lungs are clear. The visualized skeletal structures are unremarkable. IMPRESSION: No active cardiopulmonary disease. Electronically Signed   By: Charlett Nose M.D.   On: 03/16/2020 18:31     Assessment and Plan :   PDMP not reviewed this encounter.  1. Viral URI with cough   2. Nasal congestion   3. Dyspnea, unspecified type    4. Generalized headaches     Will manage for viral illness such as viral URI, viral syndrome, viral rhinitis, COVID-19. Counseled patient on nature of COVID-19 including modes of transmission, diagnostic testing, management and supportive care.  Offered scripts for symptomatic relief. COVID 19 testing is pending.  Emphasized need for close follow-up with her cardiologist given her heart failure, shortness of breath.  Low suspicion for PE as patient has minimal risk factors.  Ultimately, patient is to maintain strict ER precautions.  Counseled patient on potential for adverse effects with medications prescribed/recommended today, ER and return-to-clinic precautions discussed, patient verbalized understanding.    Wallis Bamberg, [place] 03/16/20 1925

## 2020-03-16 NOTE — Discharge Instructions (Signed)

## 2020-03-16 NOTE — ED Triage Notes (Signed)
Pt presents with cough, body aches, nasal congestion, nausea and SOB when lay down.

## 2020-03-17 LAB — SARS CORONAVIRUS 2 (TAT 6-24 HRS): SARS Coronavirus 2: NEGATIVE

## 2020-03-29 DIAGNOSIS — Z021 Encounter for pre-employment examination: Secondary | ICD-10-CM | POA: Diagnosis not present

## 2020-04-12 ENCOUNTER — Ambulatory Visit: Payer: BC Managed Care – PPO | Attending: Internal Medicine | Admitting: Internal Medicine

## 2020-05-15 ENCOUNTER — Ambulatory Visit: Payer: Self-pay | Admitting: Internal Medicine

## 2020-06-12 ENCOUNTER — Encounter: Payer: Self-pay | Admitting: Internal Medicine

## 2020-06-12 ENCOUNTER — Ambulatory Visit: Payer: Self-pay | Admitting: *Deleted

## 2020-06-12 DIAGNOSIS — I10 Essential (primary) hypertension: Secondary | ICD-10-CM

## 2020-06-12 NOTE — Telephone Encounter (Signed)
  C/o elevated B/P and leg pain since Friday. Patient sent message via My Chart this am. C/o B/P now 169/116. patient has had to call out of work since Friday due to elevated B/P. C/o headaches and leg pain over weekend. Friday pain in right leg behind calf then pain in bilateral legs. Soreness noted and has eased today. Denies swelling in legs or feet. Hx: blood clots in legs. No pain in legs at this time or headache. Reported chest heaviness over weekend, denies chest pain , difficulty breathing. Denies blurred vision, weakness on one side, or decrease balance. No appt or My Chart appt available prior to 07/14/20. Encouraged patient to go to UC if symptoms reoccur.Patient would like to speak to PCP prior to going to [facility] or ED if possible . Care advise given. Patient verbalized understanding of care advise and to go to [facility] or ED if symptoms worsen. Please advise if any earlier appt available.  Reason for Disposition . Systolic BP  >= 180 OR Diastolic >= 110  Answer Assessment - Initial Assessment Questions 1. BLOOD PRESSURE: "What is the blood pressure?" "Did you take at least two measurements 5 minutes apart?"     169/116 . Last B/P 148/112 2. ONSET: "When did you take your blood pressure?"     Beginning of call  3. HOW: "How did you obtain the blood pressure?" (e.g., visiting nurse, automatic home BP monitor)     Automatic home B/P monitor  4. HISTORY: "Do you have a history of high blood pressure?"     yes 5. MEDICATIONS: "Are you taking any medications for blood pressure?" "Have you missed any doses recently?"     Yes taking medications as prescribed  6. OTHER SYMPTOMS: "Do you have any symptoms?" (e.g., headache, chest pain, blurred vision, difficulty breathing, weakness)     Headache, chest tightness over weekend. Leg pain bilateral legs. Started behind right calf then both legs , eased and soreness went away.  7. PREGNANCY: "Is there any chance you are pregnant?" "When was your last menstrual  period?"     na  Protocols used: BLOOD PRESSURE - HIGH-A-AH

## 2020-06-12 NOTE — Telephone Encounter (Signed)
I have looked at the schedule and we don't have an earlier appt.

## 2020-06-13 MED ORDER — AMLODIPINE BESYLATE 5 MG PO TABS: 5.0000 mg | ORAL_TABLET | Freq: Every day | ORAL | 0 refills | Status: DC

## 2020-06-13 MED ORDER — CARVEDILOL 12.5 MG PO TABS: 12.5000 mg | ORAL_TABLET | Freq: Two times a day (BID) | ORAL | 0 refills | Status: DC

## 2020-06-13 MED ORDER — IRBESARTAN 150 MG PO TABS: 150.0000 mg | ORAL_TABLET | Freq: Every day | ORAL | 0 refills | Status: DC

## 2020-06-13 NOTE — Telephone Encounter (Signed)
Called patient to schedule a sooner appt with PCP. No voice mail was set up to leave a message.

## 2020-06-13 NOTE — Telephone Encounter (Signed)
I have looked at the schedule for today and no provider has any openings.

## 2020-06-25 ENCOUNTER — Ambulatory Visit: Payer: BC Managed Care – PPO | Admitting: Internal Medicine

## 2020-06-25 ENCOUNTER — Encounter: Payer: Self-pay | Admitting: Internal Medicine

## 2020-06-25 ENCOUNTER — Telehealth: Payer: Self-pay | Admitting: Internal Medicine

## 2020-06-25 NOTE — Telephone Encounter (Signed)
Not our pt

## 2020-06-25 NOTE — Telephone Encounter (Addendum)
Pt had an appt for today she thought it was for tomorrow. Pt bp is 143/110 pt states the md is aware concerning her bp issue . Pt would like a callback to be work in. Pt is at work and d/c the call.  I was on hold with someone at the office to speak with. Please call pt back

## 2020-06-25 NOTE — Telephone Encounter (Signed)
I have already replied to pt MyChart

## 2020-07-09 ENCOUNTER — Other Ambulatory Visit: Payer: Self-pay

## 2020-07-09 ENCOUNTER — Encounter: Payer: Self-pay | Admitting: Internal Medicine

## 2020-07-09 ENCOUNTER — Ambulatory Visit: Payer: Self-pay | Attending: Internal Medicine | Admitting: Internal Medicine

## 2020-07-09 VITALS — BP 148/80 | HR 71 | Temp 97.5°F | Resp 16 | Ht 66.0 in | Wt 216.2 lb

## 2020-07-09 DIAGNOSIS — Z1231 Encounter for screening mammogram for malignant neoplasm of breast: Secondary | ICD-10-CM

## 2020-07-09 DIAGNOSIS — Z1211 Encounter for screening for malignant neoplasm of colon: Secondary | ICD-10-CM

## 2020-07-09 DIAGNOSIS — D649 Anemia, unspecified: Secondary | ICD-10-CM

## 2020-07-09 DIAGNOSIS — I10 Essential (primary) hypertension: Secondary | ICD-10-CM

## 2020-07-09 DIAGNOSIS — E66811 Obesity, class 1: Secondary | ICD-10-CM | POA: Insufficient documentation

## 2020-07-09 DIAGNOSIS — E669 Obesity, unspecified: Secondary | ICD-10-CM | POA: Insufficient documentation

## 2020-07-09 DIAGNOSIS — Z9229 Personal history of other drug therapy: Secondary | ICD-10-CM

## 2020-07-09 DIAGNOSIS — R252 Cramp and spasm: Secondary | ICD-10-CM | POA: Insufficient documentation

## 2020-07-09 DIAGNOSIS — R0600 Dyspnea, unspecified: Secondary | ICD-10-CM

## 2020-07-09 DIAGNOSIS — R0609 Other forms of dyspnea: Secondary | ICD-10-CM

## 2020-07-09 MED ORDER — FUROSEMIDE 20 MG PO TABS: 20.0000 mg | ORAL_TABLET | Freq: Every day | ORAL | 5 refills | Status: DC

## 2020-07-09 MED ORDER — IRBESARTAN 150 MG PO TABS: 150.0000 mg | ORAL_TABLET | Freq: Every day | ORAL | 6 refills | Status: DC

## 2020-07-09 MED ORDER — POTASSIUM CHLORIDE ER 10 MEQ PO TBCR: 10.0000 meq | EXTENDED_RELEASE_TABLET | Freq: Every day | ORAL | 6 refills | Status: DC

## 2020-07-09 MED ORDER — CARVEDILOL 12.5 MG PO TABS: 12.5000 mg | ORAL_TABLET | Freq: Two times a day (BID) | ORAL | 6 refills | Status: DC

## 2020-07-09 MED ORDER — AMLODIPINE BESYLATE 5 MG PO TABS: 5.0000 mg | ORAL_TABLET | Freq: Every day | ORAL | 6 refills | Status: DC

## 2020-07-09 NOTE — Progress Notes (Signed)
Patient ID: Monique Aguilar, female    DOB: 1966/02/19  MRN: [id number]  CC: Hypertension, Referral, and Leg Swelling   Subjective: Monique Aguilar is a 54 y.o. female who presents for chronic ds management Her concerns today include:  Pt with hx of HTN, dCHF,anomalous RCA,anemia, migraines, obesity, HL, fhx of CAD, obesity  HTN: Patient sent me a MyChart message several weeks ago reporting high blood pressure readings.  She was on Avapro, carvedilol.  We added low-dose of Norvasc. Reports compliance with Avapro, Norvasc and Coreg.  She was supposed to be on furosemide and potassium.  Last prescription was written in April of last year by cardiology PA with refills for 1 year supply.  Patient tells me that she still takes it.  When asked about how she is taking it given that she would have ran out in April of this year, she then said that she takes it about once or twice a week. Checking BP daily.  Yesterday was 159/86. Most of the time it is higher Limits salt in foods.  "I'm addicted to sweets.  I would prefer to eat sweets like Oreos, ice cream, then regular food."  Rarely drinks water, no sodas but likes tea. Energy level is low.  Walking a lot recently with her sister.  Walking 3 x/wk for 20 but tires easily. No CP but feel out of breath.   -endorses swelling legs x 2 wks.   Reports cramps and soreness in legs at nights and sometimes with sitting.  No cramps with walking/exercise  HM: wants referral to c-scope.  She now has [employer] and [employer].  Due for PAP and MMG. Completed Moderna vaccine  Would like a note allowing her to return to work starting tomorrow.  Patient states that she has been out of work for several months   Patient Active Problem List   Diagnosis Date Noted  . Obesity (BMI 30.0-34.9) 07/09/2020  . Leg cramps 07/09/2020  . Stress incontinence 10/08/2018  . Food allergy 10/08/2018  . Left breast mass 08/15/2016  . Migraine without aura and  without status migrainosus, not intractable 08/15/2016  . Anomalous right coronary artery   . Chronic diastolic congestive heart failure (HCC)   . Hyperlipidemia 05/31/2014  . Family history of premature coronary artery disease 05/31/2014  . Morbid obesity (HCC) 05/31/2014  . HTN (hypertension) 10/11/2013  . Microcytic anemia 12/16/2012  . Renal insufficiency 12/16/2012     Current Outpatient Medications on File Prior to Visit  Medication Sig Dispense Refill  . EPINEPHrine 0.3 mg/0.3 mL IJ SOAJ injection Inject 0.3 mLs (0.3 mg total) into the muscle as needed for anaphylaxis. 2 each 1  . SUMAtriptan (IMITREX) 25 MG tablet Take 1 tablet at the start of the headache PO.  May repeat in 2 hours if no improvement. (Patient taking differently: Take 25 mg by mouth See admin instructions. Take 1 tablet at the start of the headache PO.  May repeat in 2 hours if no improvement.) 10 tablet 1   No current facility-administered medications on file prior to visit.    Allergies  Allergen Reactions  . Fruit & Vegetable Daily [Nutritional Supplements] Anaphylaxis    Strawberries or other fruit  . Grass Extracts [Gramineae Pollens] Other (See Comments)    Watery eyes, sneezing    Social History   Socioeconomic History  . Marital status: Single    Spouse name: Not on file  . Number of children: Not on file  .  Years of education: Not on file  . Highest education level: Not on file  Occupational History  . Not on file  Tobacco Use  . Smoking status: Never Smoker  . Smokeless tobacco: Never Used  Vaping Use  . Vaping Use: Never used  Substance and Sexual Activity  . Alcohol use: No  . Drug use: No  . Sexual activity: Not Currently    Birth control/protection: Surgical  Other Topics Concern  . Not on file  Social History Narrative  . Not on file   Social Determinants of Health   Financial Resource Strain:   . Difficulty of Paying Living Expenses: Not on file  Food Insecurity:   .  Worried About Programme researcher, broadcasting/film/videounning Out of Food in the Last Year: Not on file  . Ran Out of Food in the Last Year: Not on file  Transportation Needs:   . Lack of Transportation (Medical): Not on file  . Lack of Transportation (Non-Medical): Not on file  Physical Activity:   . Days of Exercise per Week: Not on file  . Minutes of Exercise per Session: Not on file  Stress:   . Feeling of Stress : Not on file  Social Connections:   . Frequency of Communication with Friends and Family: Not on file  . Frequency of Social Gatherings with Friends and Family: Not on file  . Attends Religious Services: Not on file  . Active Member of Clubs or Organizations: Not on file  . Attends BankerClub or Organization Meetings: Not on file  . Marital Status: Not on file  Intimate Partner Violence:   . Fear of Current or Ex-Partner: Not on file  . Emotionally Abused: Not on file  . Physically Abused: Not on file  . Sexually Abused: Not on file    Family History  Problem Relation Age of Onset  . Heart disease Father        Dx early 4640's (? heart valve)  . Heart attack Father [id number]  . Congestive Heart Failure Mother   . CAD Neg Hx   . Colon cancer Neg Hx   . Colon polyps Neg Hx   . Esophageal cancer Neg Hx   . Stomach cancer Neg Hx   . Rectal cancer Neg Hx     Past Surgical History:  Procedure Laterality Date  . BREAST CYST ASPIRATION    . CESAREAN SECTION  1988  . TONSILLECTOMY  1992?  . TUBAL LIGATION  1995  . WISDOM TOOTH EXTRACTION  1995    ROS: Review of Systems Negative except as stated above  PHYSICAL EXAM: BP (!) 148/80   Pulse 71   Temp (!) 97.5 F (36.4 C)   Resp 16   Ht 5\' 6"  (1.676 m)   Wt 216 lb 3.2 oz (98.1 kg)   SpO2 100%   BMI 34.90 kg/m   Wt Readings from Last 3 Encounters:  07/09/20 216 lb 3.2 oz (98.1 kg)  12/13/19 215 lb (97.5 kg)  09/08/19 216 lb 3.2 oz (98.1 kg)    Physical Exam  General appearance - alert, well appearing, obese middle-aged African-American female and in  no distress Mental status - normal mood, behavior, speech, dress, motor activity, and thought processes Eyes - pupils equal and reactive, extraocular eye movements intact Neck - supple, no significant adenopathy Chest - clear to auscultation, no wheezes, rales or rhonchi, symmetric air entry Heart - normal rate, regular rhythm, normal S1, S2, no murmurs, rubs, clicks or gallops Extremities -lower extremities are  large.  However no significant edema appreciated.  She has good dorsalis pedis, posterior tibialis and popliteal pulses 3+ bilaterally.  Legs and feet warm.   CMP Latest Ref Rng & Units 09/08/2019 06/20/2019 11/10/2018  Glucose 65 - 99 mg/dL 83 90 85  BUN 6 - 24 mg/dL 11 12 15   Creatinine 0.57 - 1.00 mg/dL ) 8.09(X) 8.33(A)  Sodium 134 - 144 mmol/L 144 138 142  Potassium 3.5 - 5.2 mmol/L 4.2 4.4 4.3  Chloride 96 - 106 mmol/L 107(H) 99 104  CO2 20 - 29 mmol/L 23 26 24   Calcium 8.7 - 10.2 mg/dL 2.50(N 9.4  Total Protein 6.0 - 8.5 g/dL 7.4 - -  Total Bilirubin 0.0 - 1.2 mg/dL 0.3 - -  Alkaline Phos 39 - 117 IU/L 70 - -  AST 0 - 40 IU/L 28 - -  ALT 0 - 32 IU/L 21 - -   Lipid Panel     Component Value Date/Time   CHOL 180 04/02/2018 1601   TRIG 94 04/02/2018 1601   HDL 60 04/02/2018 1601   CHOLHDL 3.0 04/02/2018 1601   CHOLHDL 3.1 10/11/2013 0125   VLDL 32 10/11/2013 0125   LDLCALC 101 (H) 04/02/2018 1601    CBC    Component Value Date/Time   WBC 5.7 09/08/2019 1912   WBC 5.1 06/20/2019 1219   RBC 4.51 09/08/2019 1912   RBC 4.74 06/20/2019 1219   HGB 12.0 09/08/2019 1912   HCT 36.0 09/08/2019 1912   PLT 313 09/08/2019 1912   MCV 80 09/08/2019 1912   MCH 26.6 09/08/2019 1912   MCH 26.2 06/20/2019 1219   MCHC 33.3 09/08/2019 1912   MCHC 31.9 06/20/2019 1219   RDW 14.2 09/08/2019 1912   LYMPHSABS 3.1 09/08/2019 1912   MONOABS 0.4 10/10/2013 1236   EOSABS 0.1 09/08/2019 1912   BASOSABS 0.0 09/08/2019 1912    ASSESSMENT AND PLAN: 1. Essential  hypertension Blood pressure today improved from numbers at she had sent to me several weeks ago.  She will continue Avapro, amlodipine and carvedilol.  Discussed increasing the amlodipine given that she does not have any edema on exam versus restarting low-dose of furosemide with potassium supplement.  Patient wants to be put back on furosemide.  She was originally on 80 mg but I do not think that she needs such a high dose at this time.  We will put her on 20 mg with 10 mEq of potassium with it.  Advised to continue to check blood pressure with goal being 130/80 or lower. - Comprehensive metabolic panel - potassium chloride (KLOR-CON) 10 MEQ tablet; Take 1 tablet (10 mEq total) by mouth daily.  Dispense: 30 tablet; Refill: 6 - carvedilol (COREG) 12.5 MG tablet; Take 1 tablet (12.5 mg total) by mouth 2 (two) times daily with a meal.  Dispense: 60 tablet; Refill: 6 - irbesartan (AVAPRO) 150 MG tablet; Take 1 tablet (150 mg total) by mouth daily.  Dispense: 30 tablet; Refill: 6 - amLODipine (NORVASC) 5 MG tablet; Take 1 tablet (5 mg total) by mouth daily.  Dispense: 30 tablet; Refill: 6 - furosemide (LASIX) 20 MG tablet; Take 1 tablet (20 mg total) by mouth daily.  Dispense: 30 tablet; Refill: 5  2. Leg cramps Questionable etiology.  We will check chemistry today to make sure her electrolytes are okay.  Pulses are good so I do not think this is PAD.  If electrolytes are okay, we can consider putting her on a muscle relaxant to  use as needed.  3. Obesity (BMI 30.0-34.9) Dietary counseling given.  Advised to cut back on eating sweets and try to snack on healthy foods like fruits or nuts.  Advised to eliminate sugary drinks from the diet, eat more white meat than red meat and smaller portions of white carbs.  Printed information given to the patient.  Commended her on starting an exercise program.  It is good idea that she start low and go slow. - Hemoglobin A1c - Lipid panel  4. DOE (dyspnea on  exertion) Likely due to deconditioning.  We will check CBC to rule out anemia.  We will also check a BNP given past history of diastolic CHF.   - CBC - Brain natriuretic peptide  5. Screening for colon cancer - Ambulatory referral to Gastroenterology  6. Encounter for screening mammogram for malignant neoplasm of breast - MM Digital Screening; Future  7. COVID-19 vaccine series completed  At the end of the visit, patient sent a message to me via my CMA stating that she has been taking NyQuil at night to help her sleep and wanted to know whether this is good or not and if not what can she take.  I advised to stop the NyQuil and take melatonin 3 mg at bedtime instead.  This can be purchased over-the-counter.  Patient will send a message via MyChart with her insurance information so that colonoscopy and mammogram can be scheduled.  Patient was given the opportunity to ask questions.  Patient verbalized understanding of the plan and was able to repeat key elements of the plan.   Orders Placed This Encounter  Procedures  . MM Digital Screening  . Comprehensive metabolic panel  . Hemoglobin A1c  . Lipid panel  . CBC  . Brain natriuretic peptide  . Ambulatory referral to Gastroenterology     Requested Prescriptions   Signed Prescriptions Disp Refills  . potassium chloride (KLOR-CON) 10 MEQ tablet 30 tablet 6    Sig: Take 1 tablet (10 mEq total) by mouth daily.  . carvedilol (COREG) 12.5 MG tablet 60 tablet 6    Sig: Take 1 tablet (12.5 mg total) by mouth 2 (two) times daily with a meal.  . irbesartan (AVAPRO) 150 MG tablet 30 tablet 6    Sig: Take 1 tablet (150 mg total) by mouth daily.  [name] amLODipine (NORVASC) 5 MG tablet 30 tablet 6    Sig: Take 1 tablet (5 mg total) by mouth daily.  . furosemide (LASIX) 20 MG tablet 30 tablet 5    Sig: Take 1 tablet (20 mg total) by mouth daily.    Return in about 5 weeks (around 08/13/2020) for PAP.  Jonah Blue, MD, FACP

## 2020-07-09 NOTE — Patient Instructions (Signed)
Healthy Eating Following a healthy eating pattern may help you to achieve and maintain a healthy body weight, reduce the risk of chronic disease, and live a long and productive life. It is important to follow a healthy eating pattern at an appropriate calorie level for your body. Your nutritional needs should be met primarily through food by choosing a variety of nutrient-rich foods. What are tips for following this plan? Reading food labels  Read labels and choose the following: ? Reduced or low sodium. ? Juices with 100% fruit juice. ? Foods with low saturated fats and high polyunsaturated and monounsaturated fats. ? Foods with whole grains, such as whole wheat, cracked wheat, brown rice, and wild rice. ? Whole grains that are fortified with folic acid. This is recommended for women who are pregnant or who want to become pregnant.  Read labels and avoid the following: ? Foods with a lot of added sugars. These include foods that contain brown sugar, corn sweetener, corn syrup, dextrose, fructose, glucose, high-fructose corn syrup, honey, invert sugar, lactose, malt syrup, maltose, molasses, raw sugar, sucrose, trehalose, or turbinado sugar.  Do not eat more than the following amounts of added sugar per day:  6 teaspoons (25 g) for women.  9 teaspoons (38 g) for men. ? Foods that contain processed or refined starches and grains. ? Refined grain products, such as white flour, degermed cornmeal, white bread, and white rice. Shopping  Choose nutrient-rich snacks, such as vegetables, whole fruits, and nuts. Avoid high-calorie and high-sugar snacks, such as potato chips, fruit snacks, and candy.  Use oil-based dressings and spreads on foods instead of solid fats such as butter, stick margarine, or cream cheese.  Limit pre-made sauces, mixes, and "instant" products such as flavored rice, instant noodles, and ready-made pasta.  Try more plant-protein sources, such as tofu, tempeh, black beans,  edamame, lentils, nuts, and seeds.  Explore eating plans such as the Mediterranean diet or vegetarian diet. Cooking  Use oil to saut or stir-fry foods instead of solid fats such as butter, stick margarine, or lard.  Try baking, boiling, grilling, or broiling instead of frying.  Remove the fatty part of meats before cooking.  Steam vegetables in water or broth. Meal planning   At meals, imagine dividing your plate into fourths: ? One-half of your plate is fruits and vegetables. ? One-fourth of your plate is whole grains. ? One-fourth of your plate is protein, especially lean meats, poultry, eggs, tofu, beans, or nuts.  Include low-fat dairy as part of your daily diet. Lifestyle  Choose healthy options in all settings, including home, work, school, restaurants, or stores.  Prepare your food safely: ? Wash your hands after handling raw meats. ? Keep food preparation surfaces clean by regularly washing with hot, soapy water. ? Keep raw meats separate from ready-to-eat foods, such as fruits and vegetables. ? Cook seafood, meat, poultry, and eggs to the recommended internal temperature. ? Store foods at safe temperatures. In general:  Keep cold foods at 59F (4.4C) or below.  Keep hot foods at 159F (60C) or above.  Keep your freezer at [facility] (-17.8C) or below.  Foods are no longer safe to eat when they have been between the temperatures of 40-159F (4.4-60C) for more than 2 hours. What foods should I eat? Fruits Aim to eat 2 cup-equivalents of fresh, canned (in natural juice), or frozen fruits each day. Examples of 1 cup-equivalent of fruit include 1 small apple, 8 large strawberries, 1 cup canned fruit,  cup  dried fruit, or 1 cup 100% juice. Vegetables Aim to eat 2-3 cup-equivalents of fresh and frozen vegetables each day, including different varieties and colors. Examples of 1 cup-equivalent of vegetables include 2 medium carrots, 2 cups raw, leafy greens, 1 cup chopped  vegetable (raw or cooked), or 1 medium baked potato. Grains Aim to eat 6 ounce-equivalents of whole grains each day. Examples of 1 ounce-equivalent of grains include 1 slice of bread, 1 cup ready-to-eat cereal, 3 cups popcorn, or  cup cooked rice, pasta, or cereal. Meats and other proteins Aim to eat 5-6 ounce-equivalents of protein each day. Examples of 1 ounce-equivalent of protein include 1 egg, 1/2 cup nuts or seeds, or 1 tablespoon (16 g) peanut butter. A cut of meat or fish that is the size of a deck of cards is about 3-4 ounce-equivalents.  Of the protein you eat each week, try to have at least 8 ounces come from seafood. This includes salmon, trout, herring, and anchovies. Dairy Aim to eat 3 cup-equivalents of fat-free or low-fat dairy each day. Examples of 1 cup-equivalent of dairy include 1 cup (240 mL) milk, 8 ounces (250 g) yogurt, 1 ounces (44 g) natural cheese, or 1 cup (240 mL) fortified soy milk. Fats and oils  Aim for about 5 teaspoons (21 g) per day. Choose monounsaturated fats, such as canola and olive oils, avocados, peanut butter, and most nuts, or polyunsaturated fats, such as sunflower, corn, and soybean oils, walnuts, pine nuts, sesame seeds, sunflower seeds, and flaxseed. Beverages  Aim for six 8-oz glasses of water per day. Limit coffee to three to five 8-oz cups per day.  Limit caffeinated beverages that have added calories, such as soda and energy drinks.  Limit alcohol intake to no more than 1 drink a day for nonpregnant women and 2 drinks a day for men. One drink equals 12 oz of beer (355 mL), 5 oz of wine (148 mL), or 1 oz of hard liquor (44 mL). Seasoning and other foods  Avoid adding excess amounts of salt to your foods. Try flavoring foods with herbs and spices instead of salt.  Avoid adding sugar to foods.  Try using oil-based dressings, sauces, and spreads instead of solid fats. This information is based on general [place] nutrition guidelines. For more  information, visit [employer]. Exact amounts may vary based on your nutrition needs. Summary  A healthy eating plan may help you to maintain a healthy weight, reduce the risk of chronic diseases, and stay active throughout your life.  Plan your meals. Make sure you eat the right portions of a variety of nutrient-rich foods.  Try baking, boiling, grilling, or broiling instead of frying.  Choose healthy options in all settings, including home, work, school, restaurants, or stores. This information is not intended to replace advice given to you by your health care provider. Make sure you discuss any questions you have with your health care provider. Document Revised: 11/02/2017 Document Reviewed: 11/02/2017 Elsevier Patient Education  [place].

## 2020-07-10 ENCOUNTER — Encounter: Payer: Self-pay | Admitting: Internal Medicine

## 2020-07-10 LAB — COMPREHENSIVE METABOLIC PANEL
ALT: 30 IU/L (ref 0–32)
AST: 23 IU/L (ref 0–40)
Albumin/Globulin Ratio: 1.6 (ref 1.2–2.2)
Albumin: 4.4 g/dL (ref 3.8–4.9)
Alkaline Phosphatase: 105 IU/L (ref 44–121)
BUN/Creatinine Ratio: 10 (ref 9–23)
BUN: 10 mg/dL (ref 6–24)
Bilirubin Total: 0.3 mg/dL (ref 0.0–1.2)
CO2: 26 mmol/L (ref 20–29)
Calcium: 9.4 mg/dL (ref 8.7–10.2)
Chloride: 105 mmol/L (ref 96–106)
Creatinine, Ser: 0.96 mg/dL (ref 0.57–1.00)
GFR calc Af Amer: 78 mL/min/{1.73_m2} (ref >59)
GFR calc non Af Amer: 67 mL/min/{1.73_m2} (ref >59)
Globulin, Total: 2.8 g/dL (ref 1.5–4.5)
Glucose: 82 mg/dL (ref 65–99)
Potassium: 4.5 mmol/L (ref 3.5–5.2)
Sodium: 141 mmol/L (ref 134–144)
Total Protein: 7.2 g/dL (ref 6.0–8.5)

## 2020-07-10 LAB — HEMOGLOBIN A1C
Est. average glucose Bld gHb Est-mCnc: 105 mg/dL
Hgb A1c MFr Bld: 5.3 % (ref 4.8–5.6)

## 2020-07-10 LAB — LIPID PANEL
Chol/HDL Ratio: 3 ratio (ref 0.0–4.4)
Cholesterol, Total: 193 mg/dL (ref 100–199)
HDL: 64 mg/dL (ref >39)
LDL Chol Calc (NIH): 117 mg/dL — ABNORMAL HIGH (ref 0–99)
Triglycerides: 66 mg/dL (ref 0–149)
VLDL Cholesterol Cal: 12 mg/dL (ref 5–40)

## 2020-07-10 LAB — CBC
Hematocrit: 34.5 % (ref 34.0–46.6)
Hemoglobin: 11.7 g/dL (ref 11.1–15.9)
MCH: 26.4 pg — ABNORMAL LOW (ref 26.6–33.0)
MCHC: 33.9 g/dL (ref 31.5–35.7)
MCV: 78 fL — ABNORMAL LOW (ref 79–97)
Platelets: 277 10*3/uL (ref 150–450)
RBC: 4.43 x10E6/uL (ref 3.77–5.28)
RDW: 13.2 % (ref 11.7–15.4)
WBC: 3.6 10*3/uL (ref 3.4–10.8)

## 2020-07-10 LAB — BRAIN NATRIURETIC PEPTIDE: BNP: 69.7 pg/mL (ref 0.0–100.0)

## 2020-07-11 NOTE — Addendum Note (Signed)
Addended by: Jonah Blue B on: 07/11/2020 10:22 AM   Modules accepted: Orders

## 2020-07-12 LAB — IRON,TIBC AND FERRITIN PANEL
Ferritin: 59 ng/mL (ref 15–150)
Iron Saturation: 18 % (ref 15–55)
Iron: 58 ug/dL (ref 27–159)
Total Iron Binding Capacity: 317 ug/dL (ref 250–450)
UIBC: 259 ug/dL (ref 131–425)

## 2020-07-12 LAB — SPECIMEN STATUS REPORT

## 2020-07-13 ENCOUNTER — Telehealth: Payer: Self-pay

## 2020-07-13 NOTE — Telephone Encounter (Signed)
Contacted pt to go over lab results pt didn't answer and was unable to lvm due to vm not being set up 

## 2020-08-28 ENCOUNTER — Ambulatory Visit: Payer: Self-pay | Admitting: Internal Medicine

## 2021-04-27 ENCOUNTER — Encounter: Payer: Self-pay | Admitting: Internal Medicine

## 2021-04-29 ENCOUNTER — Ambulatory Visit: Payer: Self-pay | Admitting: Family Medicine

## 2021-07-05 ENCOUNTER — Ambulatory Visit (HOSPITAL_COMMUNITY)
Admission: EM | Admit: 2021-07-05 | Discharge: 2021-07-05 | Disposition: A | Discharge status: Home / Self Care | Payer: Self-pay | Attending: Emergency Medicine | Admitting: Emergency Medicine

## 2021-07-05 ENCOUNTER — Other Ambulatory Visit: Payer: Self-pay

## 2021-07-05 ENCOUNTER — Encounter (HOSPITAL_COMMUNITY): Payer: Self-pay

## 2021-07-05 DIAGNOSIS — J029 Acute pharyngitis, unspecified: Secondary | ICD-10-CM

## 2021-07-05 DIAGNOSIS — R109 Unspecified abdominal pain: Secondary | ICD-10-CM

## 2021-07-05 DIAGNOSIS — R509 Fever, unspecified: Secondary | ICD-10-CM

## 2021-07-05 DIAGNOSIS — M791 Myalgia, unspecified site: Secondary | ICD-10-CM

## 2021-07-05 DIAGNOSIS — R0989 Other specified symptoms and signs involving the circulatory and respiratory systems: Secondary | ICD-10-CM

## 2021-07-05 DIAGNOSIS — R0981 Nasal congestion: Secondary | ICD-10-CM

## 2021-07-05 DIAGNOSIS — R112 Nausea with vomiting, unspecified: Secondary | ICD-10-CM

## 2021-07-05 LAB — RESPIRATORY PANEL BY PCR

## 2021-07-05 MED ORDER — OSELTAMIVIR PHOSPHATE 75 MG PO CAPS
75.0000 mg | ORAL_CAPSULE | Freq: Two times a day (BID) | ORAL | 0 refills | Status: DC
Start: 2021-07-05 — End: 2021-08-30

## 2021-07-05 MED ORDER — ONDANSETRON 4 MG PO TBDP
ORAL_TABLET | ORAL | Status: AC
  Filled 2021-07-05: qty 1

## 2021-07-05 MED ORDER — ONDANSETRON HCL 4 MG PO TABS: 4.0000 mg | ORAL_TABLET | Freq: Three times a day (TID) | ORAL | 0 refills | Status: DC | PRN

## 2021-07-05 MED ORDER — KETOROLAC TROMETHAMINE 30 MG/ML IJ SOLN
30.0000 mg | Freq: Once | INTRAMUSCULAR | Status: AC
  Administered 2021-07-05: 30 mg via INTRAMUSCULAR

## 2021-07-05 MED ORDER — KETOROLAC TROMETHAMINE 30 MG/ML IJ SOLN
INTRAMUSCULAR | Status: AC
  Filled 2021-07-05: qty 1

## 2021-07-05 MED ORDER — ONDANSETRON 4 MG PO TBDP
4.0000 mg | ORAL_TABLET | Freq: Once | ORAL | Status: AC
  Administered 2021-07-05: 4 mg via ORAL

## 2021-07-05 NOTE — ED Provider Notes (Signed)
[facility]    CSN: [id number] Arrival date & time: 07/05/21  1322      History   Chief Complaint Chief Complaint  Patient presents with   Cough   Fever    CONGESTION     HPI Monique Aguilar is a 55 y.o. female.  She reports symptoms since Wednesday evening.  Started as a mild sore throat and fatigue.  And rapidly progressed to body aches, chills, fever, nasal congestion, cough, nausea and vomiting.  Patient has vomited 5 times since yesterday.  She is able to keep down fluids and dry toast but still feels very nauseated.  Did get a flu shot.  Was tested for COVID yesterday at work and it was negative.   Cough Associated symptoms: chills, fever, myalgias, rhinorrhea and sore throat   Associated symptoms: no shortness of breath and no wheezing   Fever Associated symptoms: chills, congestion, cough, myalgias, nausea, rhinorrhea, sore throat and vomiting   Associated symptoms: no diarrhea    Past Medical History:  Diagnosis Date   Abnormal CT of the chest    a. 10/2013: 31mm RML pulm nodule, axillary lymphadenopathy. Instructed to f/u PCP.   Anomalous right coronary artery    Atypical chest pain    a. 10/2013: suspected GI etiology. Nuc - nonischemic, EF 39% but visually higher - f/u echo to clarify showed EF 50-55% with only mild LVH, no significant valvuar disease.   Chronic diastolic congestive heart failure (HCC)    HTN (hypertension)    Low back pain    Microcytic anemia    a. No prior workup.   Migraines     Patient Active Problem List   Diagnosis Date Noted   Obesity (BMI 30.0-34.9) 07/09/2020   Leg cramps 07/09/2020   Stress incontinence 10/08/2018   Food allergy 10/08/2018   Left breast mass 08/15/2016   Migraine without aura and without status migrainosus, not intractable 08/15/2016   Anomalous right coronary artery    Chronic diastolic congestive heart failure (HCC)    Hyperlipidemia 05/31/2014   Family history of premature coronary artery disease  05/31/2014   Morbid obesity (HCC) 05/31/2014   HTN (hypertension) 10/11/2013   Microcytic anemia 12/16/2012   Renal insufficiency 12/16/2012    Past Surgical History:  Procedure Laterality Date   BREAST CYST ASPIRATION     CESAREAN SECTION  1988   TONSILLECTOMY  1992?   TUBAL LIGATION  1995   WISDOM TOOTH EXTRACTION  1995    OB History   No obstetric history on file.      Home Medications    Prior to Admission medications   Medication Sig Start Date End Date Taking? Authorizing Provider  ondansetron (ZOFRAN) 4 MG tablet Take 1 tablet (4 mg total) by mouth every 8 (eight) hours as needed for nausea or vomiting. 07/05/21  Yes Cathlyn Parsons, NP  oseltamivir (TAMIFLU) 75 MG capsule Take 1 capsule (75 mg total) by mouth every 12 (twelve) hours. 07/05/21  Yes Cathlyn Parsons, NP  amLODipine (NORVASC) 5 MG tablet Take 1 tablet (5 mg total) by mouth daily. 07/09/20   Marcine Matar, MD  carvedilol (COREG) 12.5 MG tablet Take 1 tablet (12.5 mg total) by mouth 2 (two) times daily with a meal. 07/09/20   Marcine Matar, MD  EPINEPHrine 0.3 mg/0.3 mL IJ SOAJ injection Inject 0.3 mLs (0.3 mg total) into the muscle as needed for anaphylaxis. 12/08/19   Marcine Matar, MD  furosemide (LASIX) 20  MG tablet Take 1 tablet (20 mg total) by mouth daily. 07/09/20   Marcine Matar, MD  irbesartan (AVAPRO) 150 MG tablet Take 1 tablet (150 mg total) by mouth daily. 07/09/20   Marcine Matar, MD  potassium chloride (KLOR-CON) 10 MEQ tablet Take 1 tablet (10 mEq total) by mouth daily. 07/09/20   Marcine Matar, MD  SUMAtriptan (IMITREX) 25 MG tablet Take 1 tablet at the start of the headache PO.  May repeat in 2 hours if no improvement. Patient taking differently: Take 25 mg by mouth See admin instructions. Take 1 tablet at the start of the headache PO.  May repeat in 2 hours if no improvement. 10/08/18   Marcine Matar, MD    Family History Family History  Problem Relation Age of  Onset   Heart disease Father        Dx early 23's (? heart valve)   Heart attack Father 79   Congestive Heart Failure Mother    CAD Neg Hx    Colon cancer Neg Hx    Colon polyps Neg Hx    Esophageal cancer Neg Hx    Stomach cancer Neg Hx    Rectal cancer Neg Hx     Social History Social History   Tobacco Use   Smoking status: Never   Smokeless tobacco: Never  Vaping Use   Vaping Use: Never used  Substance Use Topics   Alcohol use: No   Drug use: No     Allergies   Fruit & vegetable daily [nutritional supplements] and Grass extracts [gramineae pollens]   Review of Systems Review of Systems  Constitutional:  Positive for chills and fever.  HENT:  Positive for congestion, rhinorrhea and sore throat.   Respiratory:  Positive for cough. Negative for shortness of breath and wheezing.   Gastrointestinal:  Positive for abdominal pain, nausea and vomiting. Negative for diarrhea.  Musculoskeletal:  Positive for myalgias.    Physical Exam Triage Vital Signs ED Triage Vitals  Enc Vitals Group     BP 07/05/21 1435 (!) 155/88     Pulse Rate 07/05/21 1435 82     Resp 07/05/21 1435 16     Temp 07/05/21 1435 98.3 F (36.8 C)     Temp Source 07/05/21 1435 Oral     SpO2 07/05/21 1435 100 %     Weight --      Height --      Head Circumference --      Peak Flow --      Pain Score 07/05/21 1436 8     Pain Loc --      Pain Edu? --      Excl. in GC? --    No data found.  Updated Vital Signs BP (!) 155/88 (BP Location: Left Arm)   Pulse 82   Temp 98.3 F (36.8 C) (Oral)   Resp 16   SpO2 100%   Visual Acuity Right Eye Distance:   Left Eye Distance:   Bilateral Distance:    Right Eye Near:   Left Eye Near:    Bilateral Near:     Physical Exam Constitutional:      Appearance: Normal appearance. She is obese. She is ill-appearing.  HENT:     Right Ear: Tympanic membrane, ear canal and external ear normal.     Left Ear: Tympanic membrane, ear canal and external  ear normal.     Nose: Congestion present.     Mouth/Throat:  Mouth: Mucous membranes are moist.     Pharynx: Posterior oropharyngeal erythema present.  Cardiovascular:     Rate and Rhythm: Normal rate and regular rhythm.  Pulmonary:     Effort: Pulmonary effort is normal.     Breath sounds: Normal breath sounds.  Abdominal:     General: Abdomen is flat. Bowel sounds are decreased.     Palpations: Abdomen is soft.     Tenderness: There is no guarding or rebound.     Comments: Very mild left lower quadrant tenderness to deep palpation.  Otherwise nontender to palpation.  Neurological:     Mental Status: She is alert.     UC Treatments / Results  Labs (all labs ordered are listed, but only abnormal results are displayed) Labs Reviewed  RESPIRATORY PANEL BY PCR    EKG   Radiology No results found.  Procedures Procedures (including critical care time)  Medications Ordered in UC Medications  ondansetron (ZOFRAN-ODT) disintegrating tablet 4 mg (4 mg Oral Given 07/05/21 1540)  ketorolac (TORADOL) 30 MG/ML injection 30 mg (30 mg Intramuscular Given 07/05/21 1539)    Initial Impression / Assessment and Plan / UC Course  I have reviewed the triage vital signs and the nursing notes.  Pertinent labs & imaging results that were available during my care of the patient were reviewed by me and considered in my medical decision making (see chart for details).    Influenza this year has been associated with abdominal symptoms for some people.  I suspect she may have influenza.  However, she is at the 48-hour window for starting Tamiflu.  Test results for flu and COVID will not come back until yesterday.  Discussed empirically starting Tamiflu even though she may not have influenza.  Patient agrees.  Body aches his most prominent complaint.  As patient has had some nausea and vomiting.  Will give Toradol IM here.  Also given Zofran ODT here and prescribe Zofran to use at home.   Reviewed supportive care measures for influenza.  Patient given note for work.   Final Clinical Impressions(s) / UC Diagnoses   Final diagnoses:  Suspected novel influenza A virus infection     Discharge Instructions      Once your nausea and vomiting is better, you can use ibuprofen for body aches and pain.  Use over-the-counter cold medicines like Mucinex to help manage her symptoms while you are sick.  Make sure you drink plenty of fluids and stay hydrated.   ED Prescriptions     Medication Sig Dispense Auth. Provider   oseltamivir (TAMIFLU) 75 MG capsule Take 1 capsule (75 mg total) by mouth every 12 (twelve) hours. 10 capsule Cathlyn Parsons, NP   ondansetron (ZOFRAN) 4 MG tablet Take 1 tablet (4 mg total) by mouth every 8 (eight) hours as needed for nausea or vomiting. 20 tablet Cathlyn Parsons, NP      PDMP not reviewed this encounter.   Cathlyn Parsons, NP 07/05/21 1555

## 2021-07-05 NOTE — Discharge Instructions (Addendum)
Once your nausea and vomiting is better, you can use ibuprofen for body aches and pain.  Use over-the-counter cold medicines like Mucinex to help manage her symptoms while you are sick.  Make sure you drink plenty of fluids and stay hydrated.

## 2021-07-05 NOTE — ED Triage Notes (Signed)
Pt presented to the office today for fever,body aches,coughing and congestion x 1 day.

## 2021-08-30 ENCOUNTER — Other Ambulatory Visit: Payer: Self-pay

## 2021-08-30 ENCOUNTER — Encounter (HOSPITAL_COMMUNITY): Payer: Self-pay

## 2021-08-30 ENCOUNTER — Ambulatory Visit (HOSPITAL_COMMUNITY)
Admission: EM | Admit: 2021-08-30 | Discharge: 2021-08-30 | Disposition: A | Discharge status: Home / Self Care | Payer: Self-pay | Attending: Family Medicine | Admitting: Family Medicine

## 2021-08-30 DIAGNOSIS — R52 Pain, unspecified: Secondary | ICD-10-CM | POA: Insufficient documentation

## 2021-08-30 DIAGNOSIS — R509 Fever, unspecified: Secondary | ICD-10-CM | POA: Insufficient documentation

## 2021-08-30 DIAGNOSIS — R112 Nausea with vomiting, unspecified: Secondary | ICD-10-CM | POA: Insufficient documentation

## 2021-08-30 DIAGNOSIS — J069 Acute upper respiratory infection, unspecified: Secondary | ICD-10-CM | POA: Insufficient documentation

## 2021-08-30 DIAGNOSIS — Z20822 Contact with and (suspected) exposure to covid-19: Secondary | ICD-10-CM | POA: Insufficient documentation

## 2021-08-30 LAB — POC INFLUENZA A AND B ANTIGEN (URGENT CARE ONLY)
INFLUENZA A ANTIGEN, POC: NEGATIVE
INFLUENZA B ANTIGEN, POC: NEGATIVE

## 2021-08-30 MED ORDER — ONDANSETRON HCL 4 MG PO TABS: 4.0000 mg | ORAL_TABLET | Freq: Four times a day (QID) | ORAL | 0 refills | Status: DC

## 2021-08-30 NOTE — Discharge Instructions (Addendum)
You were seen today for nausea, vomiting, fever, and body aches.  Your flu swab was negative today.  Your covid test is pending and will be resulted tomorrow.  This is likely a viral like illness.  I have sent zofran to your pharmacy to help with the nausea and vomiting.  I recommend small amounts of clear liquids, such as ginger ale or sprite.   You may use tylenol for body aches, fever, and headaches.  Please follow up if your are not improving, or worsening over time.

## 2021-08-30 NOTE — ED Provider Notes (Signed)
[facility]    CSN: [id number] Arrival date & time: 08/30/21  1418      History   Chief Complaint Chief Complaint  Patient presents with   Emesis   Fever   Generalized Body Aches    HPI Monique Aguilar is a 56 y.o. female.   Yesterday morning she woke up with sore throat.  Having hot sweats/chills.  She has n/v.  Fever up to 101 yesterday, took tylenol for this.  She is unable to keep solid foods down.  She is able to keep down ginger ale only.  She is fatigued, body aches.  Exposed to both flu and covid.  Slight cough, slight sinus congestion, but not bad.   Past Medical History:  Diagnosis Date   Abnormal CT of the chest    a. 10/2013: 42mm RML pulm nodule, axillary lymphadenopathy. Instructed to f/u PCP.   Anomalous right coronary artery    Atypical chest pain    a. 10/2013: suspected GI etiology. Nuc - nonischemic, EF 39% but visually higher - f/u echo to clarify showed EF 50-55% with only mild LVH, no significant valvuar disease.   Chronic diastolic congestive heart failure (HCC)    HTN (hypertension)    Low back pain    Microcytic anemia    a. No prior workup.   Migraines     Patient Active Problem List   Diagnosis Date Noted   Obesity (BMI 30.0-34.9) 07/09/2020   Leg cramps 07/09/2020   Stress incontinence 10/08/2018   Food allergy 10/08/2018   Left breast mass 08/15/2016   Migraine without aura and without status migrainosus, not intractable 08/15/2016   Anomalous right coronary artery    Chronic diastolic congestive heart failure (HCC)    Hyperlipidemia 05/31/2014   Family history of premature coronary artery disease 05/31/2014   Morbid obesity (HCC) 05/31/2014   HTN (hypertension) 10/11/2013   Microcytic anemia 12/16/2012   Renal insufficiency 12/16/2012    Past Surgical History:  Procedure Laterality Date   BREAST CYST ASPIRATION     CESAREAN SECTION  1988   TONSILLECTOMY  1992?   TUBAL LIGATION  1995   WISDOM TOOTH EXTRACTION  1995     OB History   No obstetric history on file.      Home Medications    Prior to Admission medications   Medication Sig Start Date End Date Taking? Authorizing Provider  amLODipine (NORVASC) 5 MG tablet Take 1 tablet (5 mg total) by mouth daily. 07/09/20   Marcine Matar, MD  carvedilol (COREG) 12.5 MG tablet Take 1 tablet (12.5 mg total) by mouth 2 (two) times daily with a meal. 07/09/20   Marcine Matar, MD  EPINEPHrine 0.3 mg/0.3 mL IJ SOAJ injection Inject 0.3 mLs (0.3 mg total) into the muscle as needed for anaphylaxis. 12/08/19   Marcine Matar, MD  furosemide (LASIX) 20 MG tablet Take 1 tablet (20 mg total) by mouth daily. 07/09/20   Marcine Matar, MD  irbesartan (AVAPRO) 150 MG tablet Take 1 tablet (150 mg total) by mouth daily. 07/09/20   Marcine Matar, MD  ondansetron (ZOFRAN) 4 MG tablet Take 1 tablet (4 mg total) by mouth every 8 (eight) hours as needed for nausea or vomiting. Patient not taking: Reported on 08/30/2021 07/05/21   Cathlyn Parsons, NP  oseltamivir (TAMIFLU) 75 MG capsule Take 1 capsule (75 mg total) by mouth every 12 (twelve) hours. Patient not taking: Reported on 08/30/2021 07/05/21   Vassie Moment,  Marzella Schlein, NP  potassium chloride (KLOR-CON) 10 MEQ tablet Take 1 tablet (10 mEq total) by mouth daily. 07/09/20   Marcine Matar, MD  SUMAtriptan (IMITREX) 25 MG tablet Take 1 tablet at the start of the headache PO.  May repeat in 2 hours if no improvement. Patient taking differently: Take 25 mg by mouth See admin instructions. Take 1 tablet at the start of the headache PO.  May repeat in 2 hours if no improvement. 10/08/18   Marcine Matar, MD    Family History Family History  Problem Relation Age of Onset   Heart disease Father        Dx early 101's (? heart valve)   Heart attack Father 74   Congestive Heart Failure Mother    CAD Neg Hx    Colon cancer Neg Hx    Colon polyps Neg Hx    Esophageal cancer Neg Hx    Stomach cancer Neg Hx    Rectal  cancer Neg Hx     Social History Social History   Tobacco Use   Smoking status: Never   Smokeless tobacco: Never  Vaping Use   Vaping Use: Never used  Substance Use Topics   Alcohol use: No   Drug use: No     Allergies   Fruit & vegetable daily [nutritional supplements] and Grass extracts [gramineae pollens]   Review of Systems Review of Systems  Constitutional:  Positive for activity change, appetite change, chills, fatigue and fever.  HENT:  Positive for congestion, sinus pressure and sore throat.   Respiratory:  Positive for cough and wheezing. Negative for shortness of breath.   Cardiovascular: Negative.   Gastrointestinal:  Positive for nausea and vomiting. Negative for diarrhea.  Genitourinary: Negative.     Physical Exam Triage Vital Signs ED Triage Vitals  Enc Vitals Group     BP 08/30/21 1432 (!) 149/90     Pulse Rate 08/30/21 1432 86     Resp 08/30/21 1432 14     Temp 08/30/21 1432 98.2 F (36.8 C)     Temp Source 08/30/21 1432 Oral     SpO2 08/30/21 1432 98 %     Weight --      Height --      Head Circumference --      Peak Flow --      Pain Score 08/30/21 1434 8     Pain Loc --      Pain Edu? --      Excl. in GC? --    No data found.  Updated Vital Signs BP (!) 149/90 (BP Location: Left Arm)    Pulse 86    Temp 98.2 F (36.8 C) (Oral)    Resp 14    SpO2 98%   Visual Acuity Right Eye Distance:   Left Eye Distance:   Bilateral Distance:    Right Eye Near:   Left Eye Near:    Bilateral Near:     Physical Exam Constitutional:      Appearance: Normal appearance.  HENT:     Head: Normocephalic and atraumatic.     Right Ear: Tympanic membrane normal.     Left Ear: Tympanic membrane normal.     Nose: No congestion or rhinorrhea.     Mouth/Throat:     Mouth: Mucous membranes are moist.     Pharynx: Posterior oropharyngeal erythema present. No oropharyngeal exudate.  Cardiovascular:     Rate and Rhythm: Normal rate and regular rhythm.  Pulmonary:     Effort: Pulmonary effort is normal.     Breath sounds: Normal breath sounds.  Abdominal:     General: Bowel sounds are normal.     Palpations: Abdomen is soft.     Tenderness: There is abdominal tenderness.  Musculoskeletal:     Cervical back: Normal range of motion and neck supple. Tenderness present.  Neurological:     Mental Status: She is alert.     UC Treatments / Results  Labs (all labs ordered are listed, but only abnormal results are displayed) Labs Reviewed  SARS CORONAVIRUS 2 (TAT 6-24 HRS)  POC INFLUENZA A AND B ANTIGEN (URGENT CARE ONLY)    EKG   Radiology No results found.  Procedures Procedures (including critical care time)  Medications Ordered in UC Medications - No data to display  Initial Impression / Assessment and Plan / UC Course  I have reviewed the triage vital signs and the nursing notes.  Pertinent labs & imaging results that were available during my care of the patient were reviewed by me and considered in my medical decision making (see chart for details).   Patient seen today for URI symptoms.  Flu negative, covid pending.  Likely viral in nature.  Zofran for n/v, supportive care for other symptoms.  Please follow up with your pcp if not improving over time.   Final Clinical Impressions(s) / UC Diagnoses   Final diagnoses:  Nausea and vomiting, unspecified vomiting type  Upper respiratory tract infection, unspecified type  Fever, unspecified  Body aches     Discharge Instructions      You were seen today for nausea, vomiting, fever, and body aches.  Your flu swab was negative today.  Your covid test is pending and will be resulted tomorrow.  This is likely a viral like illness.  I have sent zofran to your pharmacy to help with the nausea and vomiting.  I recommend small amounts of clear liquids, such as ginger ale or sprite.   You may use tylenol for body aches, fever, and headaches.  Please follow up if your are not  improving, or worsening over time.     ED Prescriptions     Medication Sig Dispense Auth. Provider   ondansetron (ZOFRAN) 4 MG tablet Take 1 tablet (4 mg total) by mouth every 6 (six) hours. 12 tablet Jannifer FranklinPiontek, Plato Alspaugh, MD      PDMP not reviewed this encounter.   Jannifer FranklinPiontek, Kalman Nylen, MD 08/30/21 1531

## 2021-08-30 NOTE — ED Triage Notes (Signed)
Pt presents with fever, chills, fatigue, emesis and body aches that began yesterday. Covid and flu exposure

## 2021-08-31 LAB — SARS CORONAVIRUS 2 (TAT 6-24 HRS): SARS Coronavirus 2: NEGATIVE

## 2021-10-28 ENCOUNTER — Ambulatory Visit (HOSPITAL_COMMUNITY)
Admission: EM | Admit: 2021-10-28 | Discharge: 2021-10-28 | Disposition: A | Discharge status: Home / Self Care | Payer: Self-pay | Attending: Internal Medicine | Admitting: Internal Medicine

## 2021-10-28 ENCOUNTER — Other Ambulatory Visit: Payer: Self-pay

## 2021-10-28 ENCOUNTER — Encounter (HOSPITAL_COMMUNITY): Payer: Self-pay | Admitting: *Deleted

## 2021-10-28 DIAGNOSIS — A084 Viral intestinal infection, unspecified: Secondary | ICD-10-CM | POA: Insufficient documentation

## 2021-10-28 LAB — COMPREHENSIVE METABOLIC PANEL
ALT: 21 U/L (ref 0–44)
AST: 20 U/L (ref 15–41)
Albumin: 4 g/dL (ref 3.5–5.0)
Alkaline Phosphatase: 72 U/L (ref 38–126)
Anion gap: 3 — ABNORMAL LOW (ref 5–15)
BUN: 15 mg/dL (ref 6–20)
CO2: 28 mmol/L (ref 22–32)
Calcium: 9.7 mg/dL (ref 8.9–10.3)
Chloride: 109 mmol/L (ref 98–111)
Creatinine, Ser: 1.1 mg/dL — ABNORMAL HIGH (ref 0.44–1.00)
GFR, Estimated: 59 mL/min — ABNORMAL LOW (ref >60)
Glucose, Bld: 99 mg/dL (ref 70–99)
Potassium: 4.7 mmol/L (ref 3.5–5.1)
Sodium: 140 mmol/L (ref 135–145)
Total Bilirubin: 0.4 mg/dL (ref 0.3–1.2)
Total Protein: 8 g/dL (ref 6.5–8.1)

## 2021-10-28 LAB — CBC
HCT: 38.7 % (ref 36.0–46.0)
Hemoglobin: 12.4 g/dL (ref 12.0–15.0)
MCH: 26.4 pg (ref 26.0–34.0)
MCHC: 32 g/dL (ref 30.0–36.0)
MCV: 82.5 fL (ref 80.0–100.0)
Platelets: 319 10*3/uL (ref 150–400)
RBC: 4.69 MIL/uL (ref 3.87–5.11)
RDW: 14.4 % (ref 11.5–15.5)
WBC: 4.9 10*3/uL (ref 4.0–10.5)
nRBC: 0 % (ref 0.0–0.2)

## 2021-10-28 MED ORDER — ONDANSETRON 8 MG PO TBDP: 8.0000 mg | ORAL_TABLET | Freq: Three times a day (TID) | ORAL | 0 refills | Status: DC | PRN

## 2021-10-28 NOTE — Discharge Instructions (Addendum)
Increase oral fluid intake ?If you are worsening symptoms please return to urgent care to be reevaluated ?We will call you with recommendations if labs are abnormal ? ?

## 2021-10-28 NOTE — ED Triage Notes (Signed)
Pt reports Sx started on Friday. Pt reports she has N/V/D. Pt has now has a cough . 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 ?

## 2021-10-29 NOTE — ED Provider Notes (Signed)
?[facility] ? ? ? ?CSN: [id number] ?Arrival date & time: 10/28/21  0808 ? ? ?  ? ?History   ?Chief Complaint ?Chief Complaint  ?Patient presents with  ? Emesis  ? Nausea  ? Diarrhea  ? ? ?HPI ?Monique Aguilar is a 56 y.o. female comes to the urgent care with 3-day history of nausea vomiting and diarrhea as well as a nonproductive cough.  Patient's symptoms started insidiously and has been persistent.  She has had several watery bowel movements.  Stool is not bloody or mucoid.  She has had nonbloody known bilious vomitus.  No sore throat or shortness of breath.  No chest pain or chest tightness.  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  No fever or chills.  No recent travel or change in dietary habits.  Patient is unable to keep any food or fluids down.  She denies any dizziness or lightheadedness.  ? ?HPI ? ?Past Medical History:  ?Diagnosis Date  ? Abnormal CT of the chest   ? a. 10/2013: 27mm RML pulm nodule, axillary lymphadenopathy. Instructed to f/u PCP.  ? Anomalous right coronary artery   ? Atypical chest pain   ? a. 10/2013: suspected GI etiology. Nuc - nonischemic, EF 39% but visually higher - f/u echo to clarify showed EF 50-55% with only mild LVH, no significant valvuar disease.  ? Chronic diastolic congestive heart failure (HCC)   ? HTN (hypertension)   ? Low back pain   ? Microcytic anemia   ? a. No prior workup.  ? Migraines   ? ? ?Patient Active Problem List  ? Diagnosis Date Noted  ? Obesity (BMI 30.0-34.9) 07/09/2020  ? Leg cramps 07/09/2020  ? Stress incontinence 10/08/2018  ? Food allergy 10/08/2018  ? Left breast mass 08/15/2016  ? Migraine without aura and without status migrainosus, not intractable 08/15/2016  ? Anomalous right coronary artery   ? Chronic diastolic congestive heart failure (HCC)   ? Hyperlipidemia 05/31/2014  ? Family history of premature coronary artery disease 05/31/2014  ? Morbid obesity (HCC) 05/31/2014  ? HTN (hypertension) 10/11/2013  ?  Microcytic anemia 12/16/2012  ? Renal insufficiency 12/16/2012  ? ? ?Past Surgical History:  ?Procedure Laterality Date  ? BREAST CYST ASPIRATION    ? CESAREAN SECTION  1988  ? TONSILLECTOMY  1992?  ? TUBAL LIGATION  1995  ? WISDOM TOOTH EXTRACTION  1995  ? ? ?OB History   ?No obstetric history on file. ?  ? ? ? ?Home Medications   ? ?Prior to Admission medications   ?Medication Sig Start Date End Date Taking? Authorizing Provider  ?ondansetron (ZOFRAN-ODT) 8 MG disintegrating tablet Take 1 tablet (8 mg total) by mouth every 8 (eight) hours as needed for nausea or vomiting. 10/28/21  Yes Kimoni Pagliarulo, Britta Mccreedy, MD  ?amLODipine (NORVASC) 5 MG tablet Take 1 tablet (5 mg total) by mouth daily. 07/09/20   Marcine Matar, MD  ?carvedilol (COREG) 12.5 MG tablet Take 1 tablet (12.5 mg total) by mouth 2 (two) times daily with a meal. 07/09/20   Marcine Matar, MD  ?EPINEPHrine 0.3 mg/0.3 mL IJ SOAJ injection Inject 0.3 mLs (0.3 mg total) into the muscle as needed for anaphylaxis. 12/08/19   Marcine Matar, MD  ?furosemide (LASIX) 20 MG tablet Take 1 tablet (20 mg total) by mouth daily. 07/09/20   Marcine Matar, MD  ?irbesartan (AVAPRO) 150 MG tablet Take 1 tablet (150 mg total) by mouth  daily. 07/09/20   Marcine MatarJohnson, Deborah B, MD  ?ondansetron (ZOFRAN) 4 MG tablet Take 1 tablet (4 mg total) by mouth every 6 (six) hours. 08/30/21   Piontek, Denny PeonErin, MD  ?potassium chloride (KLOR-CON) 10 MEQ tablet Take 1 tablet (10 mEq total) by mouth daily. 07/09/20   Marcine MatarJohnson, Deborah B, MD  ?SUMAtriptan (IMITREX) 25 MG tablet Take 1 tablet at the start of the headache PO.  May repeat in 2 hours if no improvement. ?Patient taking differently: Take 25 mg by mouth See admin instructions. Take 1 tablet at the start of the headache PO.  May repeat in 2 hours if no improvement. 10/08/18   Marcine MatarJohnson, Deborah B, MD  ? ? ?Family History ?Family History  ?Problem Relation Age of Onset  ? Heart disease Father   ?     Dx early 2640's (? heart valve)  ? Heart  attack Father 2071  ? Congestive Heart Failure Mother   ? CAD Neg Hx   ? Colon cancer Neg Hx   ? Colon polyps Neg Hx   ? Esophageal cancer Neg Hx   ? Stomach cancer Neg Hx   ? Rectal cancer Neg Hx   ? ? ?Social History ?Social History  ? ?Tobacco Use  ? Smoking status: Never  ? Smokeless tobacco: Never  ?Vaping Use  ? Vaping Use: Never used  ?Substance Use Topics  ? Alcohol use: No  ? Drug use: No  ? ? ? ?Allergies   ?Fruit & vegetable daily [nutritional supplements] and Grass extracts [gramineae pollens] ? ? ?Review of Systems ?Review of Systems ?As per HPI ? ?Physical Exam ?Triage Vital Signs ?ED Triage Vitals  ?Enc Vitals Group  ?   BP 10/28/21 0845 (!) 142/97  ?   Pulse Rate 10/28/21 0845 74  ?   Resp 10/28/21 0845 20  ?   Temp 10/28/21 0845 98.4 ?F (36.9 ?C)  ?   Temp src --   ?   SpO2 10/28/21 0845 97 %  ?   Weight --   ?   Height --   ?   Head Circumference --   ?   Peak Flow --   ?   Pain Score 10/28/21 0841 10  ?   Pain Loc --   ?   Pain Edu? --   ?   Excl. in GC? --   ? ?No data found. ? ?Updated Vital Signs ?BP (!) 142/97   Pulse 74   Temp 98.4 ?F (36.9 ?C)   Resp 20   SpO2 97%  ? ?Visual Acuity ?Right Eye Distance:   ?Left Eye Distance:   ?Bilateral Distance:   ? ?Right Eye Near:   ?Left Eye Near:    ?Bilateral Near:    ? ?Physical Exam ?Vitals and nursing note reviewed.  ?Constitutional:   ?   General: She is not in acute distress. ?   Appearance: She is not ill-appearing.  ?Cardiovascular:  ?   Rate and Rhythm: Normal rate and regular rhythm.  ?   Pulses: Normal pulses.  ?   Heart sounds: Normal heart sounds.  ?Pulmonary:  ?   Effort: Pulmonary effort is normal.  ?   Breath sounds: Normal breath sounds.  ?Abdominal:  ?   General: Bowel sounds are normal.  ?   Palpations: Abdomen is soft.  ?Musculoskeletal:     ?   General: Normal range of motion.  ?Neurological:  ?   Mental Status: She is alert.  ? ? ? ?  UC Treatments / Results  ?Labs ?(all labs ordered are listed, but only abnormal results are  displayed) ?Labs Reviewed  ?COMPREHENSIVE METABOLIC PANEL - Abnormal; Notable for the following components:  ?    Result Value  ? Creatinine, Ser 1.10 (*)   ? GFR, Estimated 59 (*)   ? Anion gap 3 (*)   ? All other components within normal limits  ?CBC  ? ? ?EKG ? ? ?Radiology ?No results found. ? ?Procedures ?Procedures (including critical care time) ? ?Medications Ordered in UC ?Medications - No data to display ? ?Initial Impression / Assessment and Plan / UC Course  ?I have reviewed the triage vital signs and the nursing notes. ? ?Pertinent labs & imaging results that were available during my care of the patient were reviewed by me and considered in my medical decision making (see chart for details). ? ?  ? ?1.  Viral gastroenteritis: ?CBC, BMP ?Zofran as needed for nausea/vomiting ?Increase oral fluid intake ?This is likely viral and will resolve spontaneously ?Return precautions given ?We will call patient with recommendations if labs are abnormal. ?Final Clinical Impressions(s) / UC Diagnoses  ? ?Final diagnoses:  ?Viral gastroenteritis  ? ? ? ?Discharge Instructions   ? ?  ?Increase oral fluid intake ?If you are worsening symptoms please return to urgent care to be reevaluated ?We will call you with recommendations if labs are abnormal ? ? ? ?ED Prescriptions   ? ? Medication Sig Dispense Auth. Provider  ? ondansetron (ZOFRAN-ODT) 8 MG disintegrating tablet Take 1 tablet (8 mg total) by mouth every 8 (eight) hours as needed for nausea or vomiting. 20 tablet Timira Bieda, Britta Mccreedy, MD  ? ?  ? ?PDMP not reviewed this encounter. ?  ?Merrilee Jansky, MD ?10/29/21 1017 ? ?

## 2021-11-13 ENCOUNTER — Ambulatory Visit: Payer: Self-pay | Admitting: *Deleted

## 2021-11-13 NOTE — Telephone Encounter (Signed)
?  Chief Complaint: rash and itching  ?Symptoms: reports eating apple cider vinegar gummies and has allergy to apples. Stopped x 3 weeks ago . Rash under bilateral breasts dark in color/ purple and in creases of arms. Itching all over. OTC medications have not worked in 3 weeks .  ?Frequency: greater than 3 weeks  ?Pertinent Negatives: Patient denies chest pain , difficulty breathing, no fever ?Disposition: [] ED /[x] Urgent Care (no appt availability in office) / [] Appointment(In office/virtual)/ []  [facility]/ [] Home Care/ [] Refused Recommended Disposition /[x] [facility]/Cari Mayers, NP []  Follow-up with PCP ?Additional Notes:  ? ?No available appt today . Patient at work does not want to go to ED. Protocol to be seen in 4 hours. Requesting any medication. Requesting refill of BP medications avapro 150 mg and amlodipine 5 mg . Patient requested a call back and can see PCE, , NP on 11/18/21. Requesting a my chart message to be sent due to being at work. ? ? Reason for Disposition ? [1] Localized purple or blood-colored spots or dots AND [2] not from injury or friction AND [3] no fever ? ?Answer Assessment - Initial Assessment Questions ?1. APPEARANCE of RASH: "Describe the rash."  ?    Rash under bilateral breasts dark in color and creases of arms ?2. LOCATION: "Where is the rash located?"  ?    Under breasts and creases of arms  ?3. NUMBER: "How many spots are there?"  ?    na ?4. SIZE: "How big are the spots?" (Inches, centimeters or compare to size of a coin)  ?    long ?5. ONSET: "When did the rash start?"  ?    Greater than 3 weeks ago  ?6. ITCHING: "Does the rash itch?" If Yes, ask: "How bad is the itch?"  (Scale 0-10; or none, mild, moderate, severe) ?    Yes severe at times ?7. PAIN: "Does the rash hurt?" If Yes, ask: "How bad is the pain?"  (Scale 0-10; or none, mild, moderate, severe) ?   - NONE (0): no pain ?   - MILD (1-3): doesn't interfere with normal activities  ?   -  MODERATE (4-7): interferes with normal activities or awakens from sleep  ?   - SEVERE (8-10): excruciating pain, unable to do any normal activities ?    na ?8. OTHER SYMPTOMS: "Do you have any other symptoms?" (e.g., fever) ?    No  ?9. PREGNANCY: "Is there any chance you are pregnant?" "When was your last menstrual period?" ?    na ? ?Protocols used: Rash or Redness - Localized-A-AH ? ?

## 2021-11-13 NOTE — Telephone Encounter (Signed)
Recommend patient keep apt with Monique Aguilar until able to see PCP.  ?

## 2021-11-18 ENCOUNTER — Ambulatory Visit: Payer: Self-pay | Admitting: Physician Assistant

## 2022-02-25 ENCOUNTER — Ambulatory Visit: Payer: Self-pay | Attending: Internal Medicine | Admitting: Internal Medicine

## 2022-02-25 ENCOUNTER — Other Ambulatory Visit: Payer: Self-pay

## 2022-02-25 ENCOUNTER — Encounter: Payer: Self-pay | Admitting: Internal Medicine

## 2022-02-25 VITALS — BP 130/90 | HR 77 | Temp 98.1°F | Ht 66.0 in | Wt 254.0 lb

## 2022-02-25 DIAGNOSIS — L309 Dermatitis, unspecified: Secondary | ICD-10-CM

## 2022-02-25 DIAGNOSIS — I10 Essential (primary) hypertension: Secondary | ICD-10-CM

## 2022-02-25 DIAGNOSIS — Z1231 Encounter for screening mammogram for malignant neoplasm of breast: Secondary | ICD-10-CM

## 2022-02-25 DIAGNOSIS — Z1211 Encounter for screening for malignant neoplasm of colon: Secondary | ICD-10-CM

## 2022-02-25 DIAGNOSIS — Z6841 Body Mass Index (BMI) 40.0 and over, adult: Secondary | ICD-10-CM

## 2022-02-25 MED ORDER — CARVEDILOL 12.5 MG PO TABS
12.5000 mg | ORAL_TABLET | Freq: Two times a day (BID) | ORAL | 6 refills | Status: DC
  Filled 2022-02-25: qty 60, 30d supply, fill #0

## 2022-02-25 MED ORDER — FUROSEMIDE 20 MG PO TABS
20.0000 mg | ORAL_TABLET | Freq: Every day | ORAL | 5 refills | Status: DC
  Filled 2022-02-25: qty 30, 30d supply, fill #0
  Filled 2022-06-01: qty 30, 30d supply, fill #1
  Filled 2022-09-24: qty 30, 30d supply, fill #2
  Filled 2023-02-25: qty 30, 30d supply, fill #3

## 2022-02-25 MED ORDER — NYSTATIN 100000 UNIT/GM EX POWD
1.0000 | Freq: Two times a day (BID) | CUTANEOUS | 0 refills | Status: DC
  Filled 2022-02-25 – 2022-04-03 (×2): qty 15, 8d supply, fill #0

## 2022-02-25 MED ORDER — POTASSIUM CHLORIDE ER 10 MEQ PO TBCR
10.0000 meq | EXTENDED_RELEASE_TABLET | Freq: Every day | ORAL | 6 refills | Status: DC
  Filled 2022-02-25: qty 30, 30d supply, fill #0
  Filled 2022-06-01: qty 30, 30d supply, fill #1
  Filled 2022-09-24: qty 30, 30d supply, fill #2
  Filled 2023-02-25: qty 30, 30d supply, fill #3

## 2022-02-25 MED ORDER — IRBESARTAN 150 MG PO TABS
150.0000 mg | ORAL_TABLET | Freq: Every day | ORAL | 6 refills | Status: DC
  Filled 2022-02-25: qty 30, 30d supply, fill #0
  Filled 2022-05-29 – 2022-06-06 (×2): qty 30, 30d supply, fill #1
  Filled 2022-09-24: qty 30, 30d supply, fill #2
  Filled 2023-02-25: qty 30, 30d supply, fill #3

## 2022-02-25 NOTE — Progress Notes (Signed)
Patient ID: Monique Aguilar, female    DOB: 11-06-1965  MRN: [id number]  CC: Chronic disease management  Subjective: Monique Aguilar is a 56 y.o. female who presents for chronic disease management Her concerns today include:  Pt with hx of HTN,  dCHF, anomalous RCA, anemia, migraines, obesity, HL, fhx of CAD, obesity  HTN:  reports compliance with meds - on Coreg 12.5 mg BID and Avapro, Furosemide and K+,  not taking Norvasc 5 mg.  Based on last RF dates she should have been out of the meds.  Reports she was taking the meds about 3 x/wk.  Wants to take daily because she gets HA when BP elev -did not take meds as yet for today because she has not eaten as yet for the day. No CP/SOB/LE edema Urinates well for 1st hr after taking Lasix.  Feel like incomplete emptying.    Obesity:  "My eating has been out of control." Drinks Pepsi and Dr. Reino Kent; about 10 cans a day.  Gets bad HA when she tries not to drink sodas for a few days Craves and eat sweets constantly.  Eats huge family size bags of candy Gained 38 lbs since 07/2020.  Saw allergist.  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  Was using Apple Cider Vinegar to try to help with weight loss.  Developed rash under breast when she first started taking it.  She stopped the Coca-Cola but rash still present. Very itchy Used OTC abx cream without relief  HM:  Due for MMG, PAP, colon cancer screening.  Will have insurance the end of next mth.  Would like to have them done when she has insurance.  Due for shingles vaccine and Tdap.    Patient Active Problem List   Diagnosis Date Noted   Obesity (BMI 30.0-34.9) 07/09/2020   Leg cramps 07/09/2020   Stress incontinence 10/08/2018   Food allergy 10/08/2018   Left breast mass 08/15/2016   Migraine without aura and without status migrainosus, not intractable 08/15/2016   Anomalous right coronary artery    Chronic diastolic congestive heart failure (HCC)    Hyperlipidemia 05/31/2014    Family history of premature coronary artery disease 05/31/2014   Morbid obesity (HCC) 05/31/2014   HTN (hypertension) 10/11/2013   Microcytic anemia 12/16/2012   Renal insufficiency 12/16/2012     Current Outpatient Medications on File Prior to Visit  Medication Sig Dispense Refill   EPINEPHrine 0.3 mg/0.3 mL IJ SOAJ injection Inject 0.3 mLs (0.3 mg total) into the muscle as needed for anaphylaxis. 2 each 1   No current facility-administered medications on file prior to visit.    Allergies  Allergen Reactions   Fruit & Vegetable Daily [Nutritional Supplements] Anaphylaxis    Strawberries or other fruit   Grass Extracts [Gramineae Pollens] Other (See Comments)    Watery eyes, sneezing    Social History   Socioeconomic History   Marital status: Divorced    Spouse name: Not on file   Number of children: Not on file   Years of education: Not on file   Highest education level: Not on file  Occupational History   Not on file  Tobacco Use   Smoking status: Never   Smokeless tobacco: Never  Vaping Use   Vaping Use: Never used  Substance and Sexual Activity   Alcohol use: No   Drug use: No   Sexual activity: Not Currently    Birth control/protection: Surgical  Other Topics Concern  Not on file  Social History Narrative   Not on file   Social Determinants of Health   Financial Resource Strain: Not on file  Food Insecurity: Not on file  Transportation Needs: Not on file  Physical Activity: Not on file  Stress: Not on file  Social Connections: Not on file  Intimate Partner Violence: Not on file    Family History  Problem Relation Age of Onset   Heart disease Father        Dx early 36's (? heart valve)   Heart attack Father 62   Congestive Heart Failure Mother    CAD Neg Hx    Colon cancer Neg Hx    Colon polyps Neg Hx    Esophageal cancer Neg Hx    Stomach cancer Neg Hx    Rectal cancer Neg Hx     Past Surgical History:  Procedure Laterality Date    BREAST CYST ASPIRATION     CESAREAN [place]?   TUBAL LIGATION  1995   WISDOM TOOTH EXTRACTION  1995    ROS: Review of Systems Negative except as stated above  PHYSICAL EXAM: BP 130/90   Pulse 77   Temp 98.1 F (36.7 C) (Oral)   Ht 5\' 6"  (1.676 m)   Wt 254 lb (115.2 kg)   SpO2 99%   BMI 41.00 kg/m   Wt Readings from Last 3 Encounters:  02/25/22 254 lb (115.2 kg)  07/09/20 216 lb 3.2 oz (98.1 kg)  12/13/19 215 lb (97.5 kg)    Physical Exam  General appearance - alert, well appearing, and in no distress Mental status - normal mood, behavior, speech, dress, motor activity, and thought processes Neck - supple, no significant adenopathy Chest - clear to auscultation, no wheezes, rales or rhonchi, symmetric air entry Heart - normal rate, regular rhythm, normal S1, S2, no murmurs, rubs, clicks or gallops Extremities - peripheral pulses normal, no pedal edema, no clubbing or cyanosis Skin -hyperpigmented scaly rash under both breasts.      Latest Ref Rng & Units 10/28/2021    9:19 AM 07/09/2020    3:06 PM 09/08/2019    7:12 PM  CMP  Glucose 70 - 99 mg/dL 99  82  83   BUN 6 - 20 mg/dL 15  10  11    Creatinine 0.44 - 1.00 mg/dL 1.10  0.96  1.07   Sodium 135 - 145 mmol/L 140  141  144   Potassium 3.5 - 5.1 mmol/L 4.7  4.5  4.2   Chloride 98 - 111 mmol/L 109  105  107   CO2 22 - 32 mmol/L 28  26  23    Calcium 8.9 - 10.3 mg/dL 9.7  9.4  10.1   Total Protein 6.5 - 8.1 g/dL 8.0  7.2  7.4   Total Bilirubin 0.3 - 1.2 mg/dL 0.4  0.3  0.3   Alkaline Phos 38 - 126 U/L 72  105  70   AST 15 - 41 U/L 20  23  28    ALT 0 - 44 U/L 21  30  21     Lipid Panel     Component Value Date/Time   CHOL 193 07/09/2020 1506   TRIG 66 07/09/2020 1506   HDL 64 07/09/2020 1506   CHOLHDL 3.0 07/09/2020 1506   CHOLHDL 3.1 10/11/2013 0125   VLDL 32 10/11/2013 0125   LDLCALC 117 (H) 07/09/2020 1506    CBC    Component  Value Date/Time   WBC 4.9 10/28/2021 0919   RBC  4.69 10/28/2021 0919   HGB 12.4 10/28/2021 0919   HGB 11.7 07/09/2020 1506   HCT 38.7 10/28/2021 0919   HCT 34.5 07/09/2020 1506   PLT 319 10/28/2021 0919   PLT 277 07/09/2020 1506   MCV 82.5 10/28/2021 0919   MCV 78 (L) 07/09/2020 1506   MCH 26.4 10/28/2021 0919   MCHC 32.0 10/28/2021 0919   RDW 14.4 10/28/2021 0919   RDW 13.2 07/09/2020 1506   LYMPHSABS 3.1 09/08/2019 1912   MONOABS 0.4 10/10/2013 1236   EOSABS 0.1 09/08/2019 1912   BASOSABS 0.0 09/08/2019 1912    ASSESSMENT AND PLAN: 1. Essential hypertension Not at goal.  She has not been taking her medicines consistently.  Encouraged her to take the Avapro and carvedilol as prescribed.  DASH diet encouraged. - CBC - Comprehensive metabolic panel - carvedilol (COREG) 12.5 MG tablet; Take 1 tablet (12.5 mg total) by mouth 2 (two) times daily with a meal.  Dispense: 60 tablet; Refill: 6 - irbesartan (AVAPRO) 150 MG tablet; Take 1 tablet (150 mg total) by mouth daily.  Dispense: 30 tablet; Refill: 6 - furosemide (LASIX) 20 MG tablet; Take 1 tablet (20 mg total) by mouth daily.  Dispense: 30 tablet; Refill: 5 - potassium chloride (KLOR-CON) 10 MEQ tablet; Take 1 tablet (10 mEq total) by mouth daily.  Dispense: 30 tablet; Refill: 6  2. Class 3 severe obesity due to excess calories with serious comorbidity and body mass index (BMI) of 40.0 to 44.9 in adult [facility]) Patient advised to eliminate sugary drinks from the diet, cut back on portion sizes especially of white carbohydrates, eat more white lean meat like chicken [place] and seafood instead of beef or pork and incorporate fresh fruits and vegetables into the diet daily. -Encouraged her not to purchase and keep candy and other junk foods in the house so that she is not tempted Encouraged her to get in some form of moderate intensity exercise at least 5 days a week for 30 minutes. - Lipid panel - Hemoglobin A1c  3. Encounter for screening mammogram for malignant neoplasm of  breast Referral submitted for mammogram to be scheduled after the end of August when she will have insurance.  4. Screening for colon cancer Referral submitted for colonoscopy to be scheduled after August when she has insurance.  5. Dermatitis Likely fungal.  Advised to keep the area clean and dry. - nystatin (MYCOSTATIN/NYSTOP) powder; Apply 1 Application topically 2 (two) times daily.  Dispense: 15 g; Refill: 0  Patient was given the opportunity to ask questions.  Patient verbalized understanding of the plan and was able to repeat key elements of the plan.   This documentation was completed using Radio producer.  Any transcriptional errors are unintentional.  Orders Placed This Encounter  Procedures   CBC   Comprehensive metabolic panel   Lipid panel   Hemoglobin A1c     Requested Prescriptions   Signed Prescriptions Disp Refills   carvedilol (COREG) 12.5 MG tablet 60 tablet 6    Sig: Take 1 tablet (12.5 mg total) by mouth 2 (two) times daily with a meal.   irbesartan (AVAPRO) 150 MG tablet 30 tablet 6    Sig: Take 1 tablet (150 mg total) by mouth daily.   furosemide (LASIX) 20 MG tablet 30 tablet 5    Sig: Take 1 tablet (20 mg total) by mouth daily.   potassium chloride (KLOR-CON) 10 MEQ  tablet 30 tablet 6    Sig: Take 1 tablet (10 mEq total) by mouth daily.   nystatin (MYCOSTATIN/NYSTOP) powder 15 g 0    Sig: Apply 1 Application topically 2 (two) times daily.    Return in about 6 weeks (around 04/08/2022) for PAP.  Jonah Blue, MD, FACP

## 2022-02-25 NOTE — Progress Notes (Signed)
Pt has concern of bilateral breast rash. Pt also requesting Rx RF's on all. Pt wants to discuss losing weight.

## 2022-02-25 NOTE — Patient Instructions (Signed)

## 2022-02-26 ENCOUNTER — Other Ambulatory Visit: Payer: Self-pay

## 2022-02-26 LAB — COMPREHENSIVE METABOLIC PANEL
ALT: 17 IU/L (ref 0–32)
AST: 16 IU/L (ref 0–40)
Albumin/Globulin Ratio: 1.4 (ref 1.2–2.2)
Albumin: 4.6 g/dL (ref 3.8–4.9)
Alkaline Phosphatase: 93 IU/L (ref 44–121)
BUN/Creatinine Ratio: 12 (ref 9–23)
BUN: 15 mg/dL (ref 6–24)
Bilirubin Total: 0.2 mg/dL (ref 0.0–1.2)
CO2: 25 mmol/L (ref 20–29)
Calcium: 9.9 mg/dL (ref 8.7–10.2)
Chloride: 103 mmol/L (ref 96–106)
Creatinine, Ser: 1.23 mg/dL — ABNORMAL HIGH (ref 0.57–1.00)
Globulin, Total: 3.4 g/dL (ref 1.5–4.5)
Glucose: 84 mg/dL (ref 70–99)
Potassium: 4.6 mmol/L (ref 3.5–5.2)
Sodium: 141 mmol/L (ref 134–144)
Total Protein: 8 g/dL (ref 6.0–8.5)
eGFR: 52 mL/min/{1.73_m2} — ABNORMAL LOW (ref >59)

## 2022-02-26 LAB — CBC
Hematocrit: 39.5 % (ref 34.0–46.6)
Hemoglobin: 13 g/dL (ref 11.1–15.9)
MCH: 26.4 pg — ABNORMAL LOW (ref 26.6–33.0)
MCHC: 32.9 g/dL (ref 31.5–35.7)
MCV: 80 fL (ref 79–97)
Platelets: 333 10*3/uL (ref 150–450)
RBC: 4.92 x10E6/uL (ref 3.77–5.28)
RDW: 13.7 % (ref 11.7–15.4)
WBC: 5.5 10*3/uL (ref 3.4–10.8)

## 2022-02-26 LAB — HEMOGLOBIN A1C
Est. average glucose Bld gHb Est-mCnc: 114 mg/dL
Hgb A1c MFr Bld: 5.6 % (ref 4.8–5.6)

## 2022-02-26 LAB — LIPID PANEL
Chol/HDL Ratio: 4.1 ratio (ref 0.0–4.4)
Cholesterol, Total: 196 mg/dL (ref 100–199)
HDL: 48 mg/dL (ref >39)
LDL Chol Calc (NIH): 117 mg/dL — ABNORMAL HIGH (ref 0–99)
Triglycerides: 179 mg/dL — ABNORMAL HIGH (ref 0–149)
VLDL Cholesterol Cal: 31 mg/dL (ref 5–40)

## 2022-03-05 ENCOUNTER — Other Ambulatory Visit: Payer: Self-pay

## 2022-04-03 ENCOUNTER — Encounter (HOSPITAL_COMMUNITY): Payer: Self-pay

## 2022-04-03 ENCOUNTER — Other Ambulatory Visit: Payer: Self-pay

## 2022-04-03 ENCOUNTER — Ambulatory Visit (HOSPITAL_COMMUNITY)
Admission: EM | Admit: 2022-04-03 | Discharge: 2022-04-03 | Disposition: A | Discharge status: Home / Self Care | Payer: Self-pay | Attending: Family Medicine | Admitting: Family Medicine

## 2022-04-03 ENCOUNTER — Other Ambulatory Visit: Payer: Self-pay | Admitting: Pharmacist

## 2022-04-03 DIAGNOSIS — R6883 Chills (without fever): Secondary | ICD-10-CM | POA: Insufficient documentation

## 2022-04-03 DIAGNOSIS — Z20822 Contact with and (suspected) exposure to covid-19: Secondary | ICD-10-CM | POA: Insufficient documentation

## 2022-04-03 DIAGNOSIS — J069 Acute upper respiratory infection, unspecified: Secondary | ICD-10-CM | POA: Insufficient documentation

## 2022-04-03 DIAGNOSIS — J3489 Other specified disorders of nose and nasal sinuses: Secondary | ICD-10-CM | POA: Insufficient documentation

## 2022-04-03 DIAGNOSIS — L309 Dermatitis, unspecified: Secondary | ICD-10-CM

## 2022-04-03 DIAGNOSIS — R52 Pain, unspecified: Secondary | ICD-10-CM | POA: Insufficient documentation

## 2022-04-03 DIAGNOSIS — R112 Nausea with vomiting, unspecified: Secondary | ICD-10-CM | POA: Insufficient documentation

## 2022-04-03 LAB — SARS CORONAVIRUS 2 BY RT PCR: SARS Coronavirus 2 by RT PCR: NEGATIVE

## 2022-04-03 MED ORDER — ONDANSETRON HCL 4 MG PO TABS
4.0000 mg | ORAL_TABLET | Freq: Four times a day (QID) | ORAL | 0 refills | Status: DC
  Filled 2022-04-03: qty 12, 3d supply, fill #0

## 2022-04-03 MED ORDER — NYSTATIN 100000 UNIT/GM EX POWD
1.0000 | Freq: Two times a day (BID) | CUTANEOUS | 0 refills | Status: DC
  Filled 2022-04-03 – 2022-04-17 (×2): qty 60, 30d supply, fill #0

## 2022-04-03 MED ORDER — NYSTATIN 100000 UNIT/GM EX POWD
1.0000 | Freq: Two times a day (BID) | CUTANEOUS | 0 refills | Status: DC
  Filled 2022-04-03: qty 30, 15d supply, fill #0

## 2022-04-03 NOTE — Discharge Instructions (Signed)
You were seen today for viral symptoms.  We have tested you for covid 19 today.  If positive we can start treatment.  In the mean time I have sent out zofran to help with nausea and vomiting.  I recommend you take tylenol or motrin to help with body aches and fever.  You may also take sudafed and zyrtec/claritin to help with sinus pressure.  I recommend drinking small amounts of clear liquids such as sprite or ginger ale. Eat a bland diet as well.  Please follow up if not improving as expected.

## 2022-04-03 NOTE — ED Provider Notes (Signed)
[facility]    CSN: [id number] Arrival date & time: 04/03/22  0802      History   Chief Complaint Chief Complaint  Patient presents with   Headache    HPI Monique Aguilar is a 56 y.o. female.   Patient is here for not feeling well since yesterday.  She started with headache, body aches, fevers/chills.  This morning woke up and has vomited twice.  She works at a nursing home, with lots of exposures.  Having sinus pressure, sneezing, runny nose.   She did a covid test at home, but said negative.  She did take tyelnol this morning, but did not do much.   Past Medical History:  Diagnosis Date   Abnormal CT of the chest    a. 10/2013: 65mm RML pulm nodule, axillary lymphadenopathy. Instructed to f/u PCP.   Anomalous right coronary artery    Atypical chest pain    a. 10/2013: suspected GI etiology. Nuc - nonischemic, EF 39% but visually higher - f/u echo to clarify showed EF 50-55% with only mild LVH, no significant valvuar disease.   Chronic diastolic congestive heart failure (HCC)    HTN (hypertension)    Low back pain    Microcytic anemia    a. No prior workup.   Migraines     Patient Active Problem List   Diagnosis Date Noted   Obesity (BMI 30.0-34.9) 07/09/2020   Leg cramps 07/09/2020   Stress incontinence 10/08/2018   Food allergy 10/08/2018   Left breast mass 08/15/2016   Migraine without aura and without status migrainosus, not intractable 08/15/2016   Anomalous right coronary artery    Chronic diastolic congestive heart failure (HCC)    Hyperlipidemia 05/31/2014   Family history of premature coronary artery disease 05/31/2014   Morbid obesity (HCC) 05/31/2014   HTN (hypertension) 10/11/2013   Microcytic anemia 12/16/2012   Renal insufficiency 12/16/2012    Past Surgical History:  Procedure Laterality Date   BREAST CYST ASPIRATION     CESAREAN SECTION  1988   TONSILLECTOMY  1992?   TUBAL LIGATION  1995   WISDOM TOOTH EXTRACTION  1995    OB  History   No obstetric history on file.      Home Medications    Prior to Admission medications   Medication Sig Start Date End Date Taking? Authorizing Provider  carvedilol (COREG) 12.5 MG tablet Take 1 tablet (12.5 mg total) by mouth 2 (two) times daily with a meal. 02/25/22   Marcine Matar, MD  EPINEPHrine 0.3 mg/0.3 mL IJ SOAJ injection Inject 0.3 mLs (0.3 mg total) into the muscle as needed for anaphylaxis. 12/08/19   Marcine Matar, MD  furosemide (LASIX) 20 MG tablet Take 1 tablet (20 mg total) by mouth daily. 02/25/22   Marcine Matar, MD  irbesartan (AVAPRO) 150 MG tablet Take 1 tablet (150 mg total) by mouth daily. 02/25/22   Marcine Matar, MD  nystatin (MYCOSTATIN/NYSTOP) powder Apply 1 Application topically 2 (two) times daily. 02/25/22   Marcine Matar, MD  potassium chloride (KLOR-CON) 10 MEQ tablet Take 1 tablet (10 mEq total) by mouth daily. 02/25/22   Marcine Matar, MD    Family History Family History  Problem Relation Age of Onset   Heart disease Father        Dx early 88's (? heart valve)   Heart attack Father 44   Congestive Heart Failure Mother    CAD Neg Hx  Colon cancer Neg Hx    Colon polyps Neg Hx    Esophageal cancer Neg Hx    Stomach cancer Neg Hx    Rectal cancer Neg Hx     Social History Social History   Tobacco Use   Smoking status: Never   Smokeless tobacco: Never  Vaping Use   Vaping Use: Never used  Substance Use Topics   Alcohol use: No   Drug use: No     Allergies   Fruit & vegetable daily [nutritional supplements] and Grass extracts [gramineae pollens]   Review of Systems Review of Systems  Constitutional:  Positive for chills and fever.  HENT:  Positive for congestion, rhinorrhea, sinus pressure and sinus pain.   Respiratory: Negative.  Negative for cough.   Gastrointestinal:  Positive for nausea and vomiting.  Genitourinary: Negative.   Musculoskeletal: Negative.   Skin: Negative.   Neurological:   Positive for headaches.     Physical Exam Triage Vital Signs ED Triage Vitals [04/03/22 0825]  Enc Vitals Group     BP (!) 143/93     Pulse Rate 75     Resp 18     Temp 98.1 F (36.7 C)     Temp Source Oral     SpO2 98 %     Weight      Height      Head Circumference      Peak Flow      Pain Score 8     Pain Loc      Pain Edu?      Excl. in GC?    No data found.  Updated Vital Signs BP (!) 143/93 (BP Location: Left Arm)   Pulse 75   Temp 98.1 F (36.7 C) (Oral)   Resp 18   LMP 01/26/2018   SpO2 98%   Visual Acuity Right Eye Distance:   Left Eye Distance:   Bilateral Distance:    Right Eye Near:   Left Eye Near:    Bilateral Near:     Physical Exam Constitutional:      Appearance: Normal appearance.  HENT:     Head: Normocephalic and atraumatic.     Right Ear: A middle ear effusion is present.     Left Ear: A middle ear effusion is present.     Nose:     Right Sinus: Frontal sinus tenderness present.     Left Sinus: Frontal sinus tenderness present.  Cardiovascular:     Rate and Rhythm: Normal rate and regular rhythm.  Pulmonary:     Effort: Pulmonary effort is normal.     Breath sounds: Normal breath sounds. No wheezing or rhonchi.  Musculoskeletal:     Cervical back: Normal range of motion and neck supple. Tenderness present.  Skin:    General: Skin is warm.  Neurological:     General: No focal deficit present.     Mental Status: She is alert.  Psychiatric:        Mood and Affect: Mood normal.        Behavior: Behavior normal.      UC Treatments / Results  Labs (all labs ordered are listed, but only abnormal results are displayed) Labs Reviewed - No data to display  EKG   Radiology No results found.  Procedures Procedures (including critical care time)  Medications Ordered in UC Medications - No data to display  Initial Impression / Assessment and Plan / UC Course  I have reviewed the triage  vital signs and the nursing  notes.  Pertinent labs & imaging results that were available during my care of the patient were reviewed by me and considered in my medical decision making (see chart for details).  Patient seen today for upper respiratory symptoms.  She was tested for covid today.  If positive I would start molnupiravir for her symptoms.  In the mean time supportive care.    Final Clinical Impressions(s) / UC Diagnoses   Final diagnoses:  Upper respiratory tract infection, unspecified type  Body aches  Chills  Sinus pressure  Nausea and vomiting, unspecified vomiting type     Discharge Instructions      You were seen today for viral symptoms.  We have tested you for covid 19 today.  If positive we can start treatment.  In the mean time I have sent out zofran to help with nausea and vomiting.  I recommend you take tylenol or motrin to help with body aches and fever.  You may also take sudafed and zyrtec/claritin to help with sinus pressure.  I recommend drinking small amounts of clear liquids such as sprite or ginger ale. Eat a bland diet as well.  Please follow up if not improving as expected.     ED Prescriptions     Medication Sig Dispense Auth. Provider   ondansetron (ZOFRAN) 4 MG tablet Take 1 tablet (4 mg total) by mouth every 6 (six) hours. 12 tablet Jannifer Franklin, MD      PDMP not reviewed this encounter.   Jannifer Franklin, MD 04/03/22 [phone]

## 2022-04-03 NOTE — ED Triage Notes (Signed)
Pt c/o headaches, low grade temp, body aches, chills, and N/V/D since yesterday. Vomiting x2 since 4 am. Took tylenol at 4 am.

## 2022-04-04 ENCOUNTER — Other Ambulatory Visit: Payer: Self-pay

## 2022-04-08 ENCOUNTER — Ambulatory Visit: Payer: Self-pay | Admitting: Internal Medicine

## 2022-04-11 ENCOUNTER — Other Ambulatory Visit: Payer: Self-pay

## 2022-04-17 ENCOUNTER — Emergency Department (HOSPITAL_COMMUNITY): Payer: Self-pay

## 2022-04-17 ENCOUNTER — Encounter: Payer: Self-pay | Admitting: Internal Medicine

## 2022-04-17 ENCOUNTER — Emergency Department (HOSPITAL_COMMUNITY)
Admission: EM | Admit: 2022-04-17 | Discharge: 2022-04-17 | Disposition: A | Discharge status: Home / Self Care | Payer: Self-pay | Attending: Emergency Medicine | Admitting: Emergency Medicine

## 2022-04-17 ENCOUNTER — Other Ambulatory Visit: Payer: Self-pay | Admitting: Internal Medicine

## 2022-04-17 ENCOUNTER — Other Ambulatory Visit: Payer: Self-pay

## 2022-04-17 DIAGNOSIS — R519 Headache, unspecified: Secondary | ICD-10-CM | POA: Insufficient documentation

## 2022-04-17 DIAGNOSIS — I10 Essential (primary) hypertension: Secondary | ICD-10-CM

## 2022-04-17 DIAGNOSIS — R079 Chest pain, unspecified: Secondary | ICD-10-CM

## 2022-04-17 DIAGNOSIS — Z79899 Other long term (current) drug therapy: Secondary | ICD-10-CM | POA: Insufficient documentation

## 2022-04-17 DIAGNOSIS — I110 Hypertensive heart disease with heart failure: Secondary | ICD-10-CM | POA: Insufficient documentation

## 2022-04-17 DIAGNOSIS — I509 Heart failure, unspecified: Secondary | ICD-10-CM | POA: Insufficient documentation

## 2022-04-17 LAB — CBC WITH DIFFERENTIAL/PLATELET
Abs Immature Granulocytes: 0 10*3/uL (ref 0.00–0.07)
Basophils Absolute: 0 10*3/uL (ref 0.0–0.1)
Basophils Relative: 1 %
Eosinophils Absolute: 0.2 10*3/uL (ref 0.0–0.5)
Eosinophils Relative: 5 %
HCT: 37.6 % (ref 36.0–46.0)
Hemoglobin: 12.2 g/dL (ref 12.0–15.0)
Immature Granulocytes: 0 %
Lymphocytes Relative: 52 %
Lymphs Abs: 2.5 10*3/uL (ref 0.7–4.0)
MCH: 27.2 pg (ref 26.0–34.0)
MCHC: 32.4 g/dL (ref 30.0–36.0)
MCV: 83.9 fL (ref 80.0–100.0)
Monocytes Absolute: 0.3 10*3/uL (ref 0.1–1.0)
Monocytes Relative: 7 %
Neutro Abs: 1.6 10*3/uL — ABNORMAL LOW (ref 1.7–7.7)
Neutrophils Relative %: 35 %
Platelets: 312 10*3/uL (ref 150–400)
RBC: 4.48 MIL/uL (ref 3.87–5.11)
RDW: 14.3 % (ref 11.5–15.5)
WBC: 4.7 10*3/uL (ref 4.0–10.5)
nRBC: 0 % (ref 0.0–0.2)

## 2022-04-17 LAB — BRAIN NATRIURETIC PEPTIDE: B Natriuretic Peptide: 24.4 pg/mL (ref 0.0–100.0)

## 2022-04-17 LAB — BASIC METABOLIC PANEL
Anion gap: 8 (ref 5–15)
BUN: 12 mg/dL (ref 6–20)
CO2: 25 mmol/L (ref 22–32)
Calcium: 9.5 mg/dL (ref 8.9–10.3)
Chloride: 107 mmol/L (ref 98–111)
Creatinine, Ser: 1.11 mg/dL — ABNORMAL HIGH (ref 0.44–1.00)
GFR, Estimated: 58 mL/min — ABNORMAL LOW (ref >60)
Glucose, Bld: 89 mg/dL (ref 70–99)
Potassium: 4.3 mmol/L (ref 3.5–5.1)
Sodium: 140 mmol/L (ref 135–145)

## 2022-04-17 LAB — TROPONIN I (HIGH SENSITIVITY)
Troponin I (High Sensitivity): 4 ng/L (ref <18)
Troponin I (High Sensitivity): 4 ng/L (ref <18)

## 2022-04-17 MED ORDER — CARVEDILOL 12.5 MG PO TABS: 18.7500 mg | ORAL_TABLET | Freq: Two times a day (BID) | ORAL | 6 refills | Status: DC

## 2022-04-17 MED ORDER — METOCLOPRAMIDE HCL 10 MG PO TABS
10.0000 mg | ORAL_TABLET | Freq: Once | ORAL | Status: AC
  Administered 2022-04-17: 10 mg via ORAL
  Filled 2022-04-17: qty 1

## 2022-04-17 MED ORDER — ALUM & MAG HYDROXIDE-SIMETH 200-200-20 MG/5ML PO SUSP
30.0000 mL | Freq: Once | ORAL | Status: AC
  Administered 2022-04-17: 30 mL via ORAL
  Filled 2022-04-17: qty 30

## 2022-04-17 MED ORDER — LACTATED RINGERS IV BOLUS: 500.0000 mL | Freq: Once | INTRAVENOUS | Status: DC

## 2022-04-17 MED ORDER — DIPHENHYDRAMINE HCL 25 MG PO CAPS
25.0000 mg | ORAL_CAPSULE | Freq: Once | ORAL | Status: AC
  Administered 2022-04-17: 25 mg via ORAL
  Filled 2022-04-17: qty 1

## 2022-04-17 NOTE — ED Provider Triage Note (Signed)
Emergency Medicine Provider Triage Evaluation Note  Monique Aguilar , a 56 y.o. female  was evaluated in triage.  Pt complains of chest pain and headache.  Patient reports high blood pressure despite taking her medication.  She reports last night she developed central chest pressure that has been continuing this morning.  Denies shortness of breath but does report some swelling in her lower extremities.  Also reports intermittent headaches since Monday.  Called her primary care doctor but has not heard back yet regarding chest pain or headache so came in to get checked out.  Reports she has had some intermittent tingling to her lips but no other numbness or weakness.  Positive: Chest pain, headache, lower extremity swelling Negative: Shortness of breath, numbness, weakness  Physical Exam  BP (!) 158/96 (BP Location: Right Arm)   Pulse 77   Temp 98.2 F (36.8 C) (Oral)   Resp 17   Ht 5\' 6"  (1.676 m)   Wt 101.6 kg   LMP 01/26/2018   SpO2 99%   BMI 36.15 kg/m  Gen:   Awake, no distress   Resp:  Normal effort, CTA bilat, RRR MSK:   Moves extremities without difficulty  Other:  No focal neurologic deficits  Medical Decision Making  Medically screening exam initiated at 10:42 AM.  Appropriate orders placed.  OAKLEY ORBAN was informed that the remainder of the evaluation will be completed by another provider, this initial triage assessment does not replace that evaluation, and the importance of remaining in the ED until their evaluation is complete.     Nyoka Lint, Dartha Lodge 04/17/22 1056

## 2022-04-17 NOTE — ED Provider Notes (Signed)
[facility] EMERGENCY DEPARTMENT Provider Note   CSN: [id number] Arrival date & time: 04/17/22  0931     History  Chief Complaint  Patient presents with   Hypertension   Chest Pain   Facial Swelling   lips tingling   Headache    Monique Aguilar is a 56 y.o. female.  HPI 56 year old female with a history of hypertension, CHF with EF of 50 to 55%, migraines, low back pain presents to the ER with complaints of chest pain, lips tingling and headache.  Patient states that her blood pressure has been reading high in the mornings before she takes her medicine, reading as high as 180/190 systolics.  She states that her blood pressure does come down after she takes her medicine.  She states that this morning she woke up with a headache, and no blurry vision, no nausea or vomiting.  Decided to go to work, but then started to feel some lip tingling.  Her coworkers urged her to go to the ER.  She called Dr. Laural Benes at [facility], who recommended increasing her carvedilol, but then she tried to explain her symptoms to the them and had not heard back so she decided to go to the ER instead.  She currently states that she has chest pain and feels like there is something "sitting on her chest".  She denies any radiation.    Home Medications Prior to Admission medications   Medication Sig Start Date End Date Taking? Authorizing Provider  acetaminophen (TYLENOL) 500 MG tablet Take 1,000 mg by mouth daily as needed for headache.   Yes [provider]  carvedilol (COREG) 12.5 MG tablet Take 1.5 tablets (18.75 mg total) by mouth 2 (two) times daily with a meal. Patient taking differently: Take 12.5 mg by mouth daily. 04/17/22  Yes Marcine Matar, MD  EPINEPHrine 0.3 mg/0.3 mL IJ SOAJ injection Inject 0.3 mLs (0.3 mg total) into the muscle as needed for anaphylaxis. 12/08/19  Yes Marcine Matar, MD  furosemide (LASIX) 20 MG tablet Take 1 tablet (20 mg total) by  mouth daily. 02/25/22  Yes Marcine Matar, MD  ibuprofen (ADVIL) 200 MG tablet Take 400 mg by mouth daily as needed for headache.   Yes [provider]  irbesartan (AVAPRO) 150 MG tablet Take 1 tablet (150 mg total) by mouth daily. 02/25/22  Yes Marcine Matar, MD  potassium chloride (KLOR-CON) 10 MEQ tablet Take 1 tablet (10 mEq total) by mouth daily. 02/25/22  Yes Marcine Matar, MD  nystatin (MYCOSTATIN/NYSTOP) powder Apply 1 Application topically 2 (two) times daily. Patient not taking: Reported on 04/17/2022 04/03/22   Marcine Matar, MD  ondansetron (ZOFRAN) 4 MG tablet Take 1 tablet (4 mg total) by mouth every 6 (six) hours. Patient not taking: Reported on 04/17/2022 04/03/22   Jannifer Franklin, MD      Allergies    Almond (diagnostic), Apple, Fruit & vegetable daily [nutritional supplements], Grass extracts [gramineae pollens], and Peach [prunus persica]    Review of Systems   Review of Systems Ten systems reviewed and are negative for acute change, except as noted in the HPI.   Physical Exam Updated Vital Signs BP (!) 151/83   Pulse 77   Temp 98.2 F (36.8 C) (Oral)   Resp 13   Ht 5\' 6"  (1.676 m)   Wt 101.6 kg   LMP 01/26/2018   SpO2 100%   BMI 36.15 kg/m  Physical Exam Vitals  and nursing note reviewed.  Constitutional:      General: She is not in acute distress.    Appearance: She is well-developed.  HENT:     Head: Normocephalic and atraumatic.  Eyes:     Conjunctiva/sclera: Conjunctivae normal.  Cardiovascular:     Rate and Rhythm: Normal rate and regular rhythm.     Heart sounds: No murmur heard. Pulmonary:     Effort: Pulmonary effort is normal. No respiratory distress.     Breath sounds: Normal breath sounds.  Abdominal:     Palpations: Abdomen is soft.     Tenderness: There is no abdominal tenderness.  Musculoskeletal:        General: No swelling.     Cervical back: Neck supple.  Skin:    General: Skin is warm and dry.     Capillary  Refill: Capillary refill takes less than 2 seconds.  Neurological:     Mental Status: She is alert.     Comments: Mental Status:  Alert, thought content appropriate, able to give a coherent history. Speech fluent without evidence of aphasia. Able to follow 2 step commands without difficulty.  Cranial Nerves:  II:  Peripheral visual fields grossly normal, pupils equal, round, reactive to light III,IV, VI: ptosis not present, extra-ocular motions intact bilaterally  V,VII: smile symmetric, facial light touch sensation equal VIII: hearing grossly normal to voice  X: uvula elevates symmetrically  XI: bilateral shoulder shrug symmetric and strong XII: midline tongue extension without fassiculations Motor:  Normal tone. 5/5 strength of BUE and BLE major muscle groups including strong and equal grip strength and dorsiflexion/plantar flexion Sensory: light touch normal in all extremities. Cerebellar: normal finger-to-nose with bilateral upper extremities, Romberg sign absent Gait: Not accessed     Psychiatric:        Mood and Affect: Mood normal.     ED Results / Procedures / Treatments   Labs (all labs ordered are listed, but only abnormal results are displayed) Labs Reviewed  BASIC METABOLIC PANEL - Abnormal; Notable for the following components:      Result Value   Creatinine, Ser 1.11 (*)    GFR, Estimated 58 (*)    All other components within normal limits  CBC WITH DIFFERENTIAL/PLATELET - Abnormal; Notable for the following components:   Neutro Abs 1.6 (*)    All other components within normal limits  BRAIN NATRIURETIC PEPTIDE  TROPONIN I (HIGH SENSITIVITY)  TROPONIN I (HIGH SENSITIVITY)    EKG EKG Interpretation  Date/Time:  Thursday April 17 2022 09:56:19 EDT Ventricular Rate:  74 PR Interval:  148 QRS Duration: 74 QT Interval:  372 QTC Calculation: 412 R Axis:   44 Text Interpretation: Normal sinus rhythm Cannot rule out Anterior infarct , age undetermined  Abnormal ECG When compared with ECG of 20-Jun-2019 12:03, PREVIOUS ECG IS PRESENT Confirmed by Virgina Norfolk ([id number]) on 04/17/2022 11:32:58 AM  Radiology CT Head Wo Contrast  Result Date: 04/17/2022 CLINICAL DATA:  Headaches EXAM: CT HEAD WITHOUT CONTRAST TECHNIQUE: Contiguous axial images were obtained from the base of the skull through the vertex without intravenous contrast. RADIATION DOSE REDUCTION: This exam was performed according to the departmental dose-optimization program which includes automated exposure control, adjustment of the mA and/or kV according to patient size and/or use of iterative reconstruction technique. COMPARISON:  08/05/2010 FINDINGS: Brain: No evidence of acute infarction, hemorrhage, hydrocephalus, extra-axial collection or mass lesion/mass effect. Vascular: No hyperdense vessel or unexpected calcification. Skull: No osseous abnormality. Sinuses/Orbits: Visualized paranasal sinuses  are clear. Visualized mastoid sinuses are clear. Visualized orbits demonstrate no focal abnormality. Other: None IMPRESSION: 1. No acute intracranial findings. Electronically Signed   By: Elige Ko M.D.   On: 04/17/2022 12:36   DG Chest 2 View  Result Date: 04/17/2022 CLINICAL DATA:  Chest pain. EXAM: CHEST - 2 VIEW COMPARISON:  March 16, 2020. FINDINGS: The heart size and mediastinal contours are within normal limits. Both lungs are clear. The visualized skeletal structures are unremarkable. IMPRESSION: No active cardiopulmonary disease. Electronically Signed   By: Lupita Raider M.D.   On: 04/17/2022 11:41    Procedures Procedures    Medications Ordered in ED Medications  metoCLOPramide (REGLAN) tablet 10 mg (10 mg Oral Given 04/17/22 1328)  diphenhydrAMINE (BENADRYL) capsule 25 mg (25 mg Oral Given 04/17/22 1328)  alum & mag hydroxide-simeth (MAALOX/MYLANTA) 200-200-20 MG/5ML suspension 30 mL (30 mLs Oral Given 04/17/22 1329)    ED Course/ Medical Decision Making/ A&P                            Medical Decision Making Amount and/or Complexity of Data Reviewed Radiology: ordered.  Risk Prescription drug management.   56 year old female presenting with chest pain On arrival, BP of 139/74, other vitals stable. Exam unremarkable, no focal neurodeficits noted. Labs ordered, reviewed and interpreted by me.  CBC without acute findings.  Creatinine of 1.11 but this is baseline.  BNP normal.   X-ray ordered in triage, reviewed, agree with radiology read.  Negative for acute findings.  CT head ordered to rule out intracranial bleed in the setting of hypertension, negative for acute findings.  On reevaluation, patient notes improvement in headache and chest pain has now decreased.  Per review of chart, Dr. Laural Benes had already increased her carvedilol today.  I did recommend that she follow-up with a cardiologist, patient states that she does have a no has not seen them in a while.  Low suspicion for ACS, dissection, PE at this time.  Given that her symptoms did improve with some Maalox this there could be a GERD component as well.  But I do think that given her history it would be prudent for her to follow-up with cardiology.  We discussed return precautions.  She voiced understanding and is agreeable.  Stable for discharge. Final Clinical Impression(s) / ED Diagnoses Final diagnoses:  Chest pain, unspecified type    Rx / DC Orders ED Discharge Orders     None         Mare Ferrari, PA-C 04/17/22 1433    Virgina Norfolk, DO 04/17/22 1502

## 2022-04-17 NOTE — Discharge Instructions (Signed)
Your work-up today was overall reassuring.  Please follow-up with your primary care doctor.  I also recommend that you follow-up with cardiology.  You may take Tylenol or ibuprofen for headaches.  Please return to the ER if you have any new or worsening symptoms.

## 2022-04-17 NOTE — ED Triage Notes (Signed)
Pt. Stated, I started having High BP that started on Monday. I have developed some chest pain and tingling on my lips. I called my Dr. Laural Benes at [facility] told me to come here.

## 2022-05-17 ENCOUNTER — Encounter (HOSPITAL_COMMUNITY): Payer: Self-pay | Admitting: *Deleted

## 2022-05-17 ENCOUNTER — Ambulatory Visit (INDEPENDENT_AMBULATORY_CARE_PROVIDER_SITE_OTHER): Payer: Self-pay

## 2022-05-17 ENCOUNTER — Ambulatory Visit (HOSPITAL_COMMUNITY)
Admission: EM | Admit: 2022-05-17 | Discharge: 2022-05-17 | Disposition: A | Discharge status: Home / Self Care | Payer: Self-pay | Attending: Internal Medicine | Admitting: Internal Medicine

## 2022-05-17 ENCOUNTER — Other Ambulatory Visit: Payer: Self-pay

## 2022-05-17 DIAGNOSIS — U071 COVID-19: Secondary | ICD-10-CM

## 2022-05-17 DIAGNOSIS — R059 Cough, unspecified: Secondary | ICD-10-CM

## 2022-05-17 DIAGNOSIS — R0602 Shortness of breath: Secondary | ICD-10-CM

## 2022-05-17 DIAGNOSIS — R112 Nausea with vomiting, unspecified: Secondary | ICD-10-CM

## 2022-05-17 MED ORDER — ONDANSETRON HCL 4 MG/2ML IJ SOLN
INTRAMUSCULAR | Status: AC
  Filled 2022-05-17: qty 2

## 2022-05-17 MED ORDER — ONDANSETRON 4 MG PO TBDP: 4.0000 mg | ORAL_TABLET | Freq: Three times a day (TID) | ORAL | 0 refills | Status: DC | PRN

## 2022-05-17 MED ORDER — ONDANSETRON HCL 4 MG/2ML IJ SOLN
4.0000 mg | Freq: Once | INTRAMUSCULAR | Status: AC
  Administered 2022-05-17: 4 mg via INTRAMUSCULAR

## 2022-05-17 MED ORDER — BENZONATATE 100 MG PO CAPS: 100.0000 mg | ORAL_CAPSULE | Freq: Three times a day (TID) | ORAL | 0 refills | Status: DC

## 2022-05-17 MED ORDER — MOLNUPIRAVIR EUA 200MG CAPSULE: 4.0000 | ORAL_CAPSULE | Freq: Two times a day (BID) | ORAL | 0 refills | Status: DC

## 2022-05-17 NOTE — ED Triage Notes (Signed)
Pt reports sx's of HA,N/V,diarrhea started on Thursday. 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 Pt had 2 home COVID test that were Positive.

## 2022-05-17 NOTE — ED Provider Notes (Signed)
[facility]    CSN: [id number] Arrival date & time: 05/17/22  1010      History   Chief Complaint Chief Complaint  Patient presents with   Sore Throat   Emesis   Diarrhea   Headache    HPI Monique Aguilar is a 56 y.o. female.   Patient presents to urgent care for evaluation of sore throat, nasal congestion, body aches, fever/chills, headache, cough, fatigue, and nausea/vomiting/diarrhea that started 3 days ago on Thursday May 15, 2022. Home COVID-19 testing was positive yesterday (2 tests performed to confirm). 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 She has been vaccinated in the past with boosters, but states she has not received the most updated booster this fall. Cough is productive with clear sputum and worse at nighttime. She has a history of CHF and states she continues to take her lasix and potassium as prescribed, but is having to sleep on 3 pillows to be able to breathe at night without shortness of breath. Nasal congestion makes it difficult for her to breathe out of her nose. Sore throat is worse with coughing and swallowing. Fever was 101 at the highest at home, but ibuprofen has helped to bring this down. Last dose of ibuprofen 400mg  was at 6am this morning. She reports persistent nausea and states she has not been able to keep any water or food down without vomiting for the last 24 hours. She attempted eating crackers and drinking water, but states she quickly feels nauseated and has to vomit. Also reports multiple episodes of diarrhea in the last 24 hours and has not had a normal formed bowel movement in the last 2-3 days. She has attempted use of peptobismol for nausea, vomiting, and diarrhea without relief of symptoms. Denies vision changes, chest pain, abdominal pain, eye drainage, ear pain, and urinary symptoms. No  blood/mucous to the stools or emesis. Denies history of asthma or chronic respiratory problems.  Patient is not a smoker and denies drug use.    Sore Throat Associated symptoms include headaches.  Emesis Associated symptoms: diarrhea and headaches   Diarrhea Associated symptoms: headaches and vomiting   Headache Associated symptoms: diarrhea and vomiting     Past Medical History:  Diagnosis Date   Abnormal CT of the chest    a. 10/2013: 91mm RML pulm nodule, axillary lymphadenopathy. Instructed to f/u PCP.   Anomalous right coronary artery    Atypical chest pain    a. 10/2013: suspected GI etiology. Nuc - nonischemic, EF 39% but visually higher - f/u echo to clarify showed EF 50-55% with only mild LVH, no significant valvuar disease.   Chronic diastolic congestive heart failure (HCC)    HTN (hypertension)    Low back pain    Microcytic anemia    a. No prior workup.   Migraines     Patient Active Problem List   Diagnosis Date Noted   Obesity (BMI 30.0-34.9) 07/09/2020   Leg cramps 07/09/2020   Stress incontinence 10/08/2018   Food allergy 10/08/2018   Left breast mass 08/15/2016   Migraine without aura and without status migrainosus, not intractable 08/15/2016   Anomalous right coronary artery    Chronic diastolic congestive heart failure (HCC)    Hyperlipidemia 05/31/2014   Family history of premature coronary artery disease 05/31/2014   Morbid obesity (HCC) 05/31/2014   HTN (  hypertension) 10/11/2013   Microcytic anemia 12/16/2012   Renal insufficiency 12/16/2012    Past Surgical History:  Procedure Laterality Date   BREAST CYST ASPIRATION     CESAREAN SECTION  1988   TONSILLECTOMY  1992?   TUBAL LIGATION  1995   WISDOM TOOTH EXTRACTION  1995    OB History   No obstetric history on file.      Home Medications    Prior to Admission medications   Medication Sig Start Date End Date Taking? Authorizing Provider  molnupiravir EUA (LAGEVRIO) 200 mg CAPS capsule  Take 4 capsules (800 mg total) by mouth 2 (two) times daily for 5 days. 05/17/22 05/22/22 Yes Tanith Dagostino, Donavan Burnet, FNP  ondansetron (ZOFRAN-ODT) 4 MG disintegrating tablet Take 1 tablet (4 mg total) by mouth every 8 (eight) hours as needed for nausea or vomiting. 05/17/22  Yes Carlisle Beers, FNP  acetaminophen (TYLENOL) 500 MG tablet Take 1,000 mg by mouth daily as needed for headache.    [provider]  carvedilol (COREG) 12.5 MG tablet Take 1.5 tablets (18.75 mg total) by mouth 2 (two) times daily with a meal. Patient taking differently: Take 12.5 mg by mouth daily. 04/17/22   Marcine Matar, MD  EPINEPHrine 0.3 mg/0.3 mL IJ SOAJ injection Inject 0.3 mLs (0.3 mg total) into the muscle as needed for anaphylaxis. 12/08/19   Marcine Matar, MD  furosemide (LASIX) 20 MG tablet Take 1 tablet (20 mg total) by mouth daily. 02/25/22   Marcine Matar, MD  ibuprofen (ADVIL) 200 MG tablet Take 400 mg by mouth daily as needed for headache.    [provider]  irbesartan (AVAPRO) 150 MG tablet Take 1 tablet (150 mg total) by mouth daily. 02/25/22   Marcine Matar, MD  nystatin (MYCOSTATIN/NYSTOP) powder Apply powder to affected area(s) 2 (two) times daily. 04/03/22   Marcine Matar, MD  potassium chloride (KLOR-CON) 10 MEQ tablet Take 1 tablet (10 mEq total) by mouth daily. 02/25/22   Marcine Matar, MD    Family History Family History  Problem Relation Age of Onset   Heart disease Father        Dx early 9's (? heart valve)   Heart attack Father 64   Congestive Heart Failure Mother    CAD Neg Hx    Colon cancer Neg Hx    Colon polyps Neg Hx    Esophageal cancer Neg Hx    Stomach cancer Neg Hx    Rectal cancer Neg Hx     Social History Social History   Tobacco Use   Smoking status: Never   Smokeless tobacco: Never  Vaping Use   Vaping Use: Never used  Substance Use Topics   Alcohol use: No   Drug use: No     Allergies   Almond  (diagnostic), Apple, Fruit & vegetable daily [nutritional supplements], Grass extracts [gramineae pollens], and Peach [prunus persica]   Review of Systems Review of Systems  Gastrointestinal:  Positive for diarrhea and vomiting.  Neurological:  Positive for headaches.  Per HPI   Physical Exam Triage Vital Signs ED Triage Vitals  Enc Vitals Group     BP 05/17/22 1050 138/86     Pulse Rate 05/17/22 1050 72     Resp 05/17/22 1050 18     Temp 05/17/22 1050 98.3 F (36.8 C)     Temp src --      SpO2 05/17/22 1050 98 %     Weight --  Height --      Head Circumference --      Peak Flow --      Pain Score 05/17/22 1048 9     Pain Loc --      Pain Edu? --      Excl. in GC? --    No data found.  Updated Vital Signs BP 138/86   Pulse 72   Temp 98.3 F (36.8 C)   Resp 18   LMP 01/26/2018   SpO2 98%   Visual Acuity Right Eye Distance:   Left Eye Distance:   Bilateral Distance:    Right Eye Near:   Left Eye Near:    Bilateral Near:     Physical Exam Vitals and nursing note reviewed.  Constitutional:      Appearance: She is obese. She is ill-appearing. She is not toxic-appearing.  HENT:     Head: Normocephalic and atraumatic.     Right Ear: Hearing, tympanic membrane, ear canal and external ear normal.     Left Ear: Hearing, tympanic membrane, ear canal and external ear normal.     Nose: Congestion and rhinorrhea present.     Mouth/Throat:     Lips: Pink.     Mouth: Mucous membranes are moist.     Pharynx: Posterior oropharyngeal erythema present.     Comments: Small amount of clear postnasal drainage visualized to the posterior oropharynx.  Eyes:     General: Lids are normal. Vision grossly intact. Gaze aligned appropriately.        Right eye: No discharge.        Left eye: No discharge.     Extraocular Movements: Extraocular movements intact.     Conjunctiva/sclera: Conjunctivae normal.     Pupils: Pupils are equal, round, and reactive to light.   Cardiovascular:     Rate and Rhythm: Normal rate and regular rhythm.     Heart sounds: Normal heart sounds, S1 normal and S2 normal.  Pulmonary:     Effort: Pulmonary effort is normal. No respiratory distress.     Breath sounds: Normal air entry. Decreased breath sounds present.     Comments: Decreased breath sounds throughout without wheeze, rhonchi, or rales. No respiratory distress.  Abdominal:     General: Abdomen is flat. Bowel sounds are normal.     Palpations: Abdomen is soft.     Tenderness: There is no abdominal tenderness.  Musculoskeletal:     Cervical back: Normal range of motion and neck supple.  Lymphadenopathy:     Cervical: No cervical adenopathy.  Skin:    General: Skin is warm and dry.     Capillary Refill: Capillary refill takes less than 2 seconds.     Findings: No rash.  Neurological:     General: No focal deficit present.     Mental Status: She is alert and oriented to person, place, and time. Mental status is at baseline.     Cranial Nerves: No dysarthria or facial asymmetry.     Motor: No weakness.     Gait: Gait normal.     Comments: No focal deficit to neurologic exam. Strength intact.  Psychiatric:        Mood and Affect: Mood normal.        Speech: Speech normal.        Behavior: Behavior normal.        Thought Content: Thought content normal.        Judgment: Judgment normal.  UC Treatments / Results  Labs (all labs ordered are listed, but only abnormal results are displayed) Labs Reviewed - No data to display  EKG   Radiology DG Chest 2 View  Result Date: 05/17/2022 CLINICAL DATA:  56 year old female with history of cough. EXAM: CHEST - 2 VIEW COMPARISON:  Chest x-ray 04/17/2022. FINDINGS: Lung volumes are normal. Mild diffuse interstitial prominence and peribronchial cuffing. No consolidative airspace disease. No pleural effusions. No pneumothorax. No pulmonary nodule or mass noted. Pulmonary vasculature and the cardiomediastinal  silhouette are within normal limits. IMPRESSION: 1. Mild diffuse peribronchial cuffing, concerning for an acute bronchitis. Electronically Signed   By: Trudie Reed M.D.   On: 05/17/2022 12:11    Procedures Procedures (including critical care time)  Medications Ordered in UC Medications  ondansetron (ZOFRAN) injection 4 mg (4 mg Intramuscular Given 05/17/22 1139)    Initial Impression / Assessment and Plan / UC Course  I have reviewed the triage vital signs and the nursing notes.  Pertinent labs & imaging results that were available during my care of the patient were reviewed by me and considered in my medical decision making (see chart for details).   1. COVID-19 Chest x-ray shows findings consistent with bronchitis without acute pleural effusion or consolidation. This is likely inflammation related to COVID-19 viral illness that will improve over time with rest, fluids, and antiviral therapy. Molnupiravir sent to pharmacy to be taken as prescribed. Zofran IM given in clinic with slight improvement in nausea. Advised bland diet over the next 12-24 hours with increased fluids to maintain hydration. Patient does not appear to be dehydrated in clinic and vitals are hemodynamically stable. Advised to continue taking lasix and potassium to prevent fluid build-up and volume overload. Tessalon pearles may be used every 8 hours as needed for cough.  She may purchase Mucinex over-the-counter to help with nasal congestion.  Strict ER return precautions given.  Quarantine guidelines discussed.  Work note given.  Patient expresses agreement with plan.   Discussed physical exam and available lab work findings in clinic with patient.  Counseled patient regarding appropriate use of medications and potential side effects for all medications recommended or prescribed today. Discussed red flag signs and symptoms of worsening condition,when to call the PCP office, return to urgent care, and when to seek higher  level of care in the emergency department. Patient verbalizes understanding and agreement with plan. All questions answered. Patient discharged in stable condition.    Final Clinical Impressions(s) / UC Diagnoses   Final diagnoses:  COVID-19  Shortness of breath  Nausea vomiting and diarrhea     Discharge Instructions      Your chest x-ray shows inflammation of your airways due to COVID-19 viral infection.  Take antiviral medication molnupiravir as prescribed.   Keep taking your lasix and potassium to improve shortness of breath.  Begin taking tessalon pearles every 8 hours as needed for cough.  Take zofran every 8 hours for nausea and vomiting. Eat a bland diet over the next few days (start with liquids, then progress to bland foods like crackers, bananas, rice, and applesauce), then you may progress to normal diet.  Attempt to drink plenty of fluids to stay well hydrated.  You may purchase mucinex over the counter and take this 2 times daily to thin your mucous.   If you develop any worsening shortness of breath, chest pain, wheezing, difficulty taking in a deep breath, or weakness/fatigue, I would like you to be reevaluated. For severe symptoms,  please go to the nearest emergency department to be reevaluated. I hope you feel better! Work note is at the end of the packet.  Wear a mask until day 11 of symptoms. May go back to work on day 6 as long as you are feeling better.      ED Prescriptions     Medication Sig Dispense Auth. Provider   ondansetron (ZOFRAN-ODT) 4 MG disintegrating tablet Take 1 tablet (4 mg total) by mouth every 8 (eight) hours as needed for nausea or vomiting. 20 tablet Talbot Grumbling, FNP   molnupiravir EUA (LAGEVRIO) 200 mg CAPS capsule Take 4 capsules (800 mg total) by mouth 2 (two) times daily for 5 days. 40 capsule Talbot Grumbling, FNP      PDMP not reviewed this encounter.   Talbot Grumbling, [place] 05/17/22 1306

## 2022-05-17 NOTE — Discharge Instructions (Addendum)
Your chest x-ray shows inflammation of your airways due to COVID-19 viral infection.  Take antiviral medication molnupiravir as prescribed.   Keep taking your lasix and potassium to improve shortness of breath.  Begin taking tessalon pearles every 8 hours as needed for cough.  Take zofran every 8 hours for nausea and vomiting. Eat a bland diet over the next few days (start with liquids, then progress to bland foods like crackers, bananas, rice, and applesauce), then you may progress to normal diet.  Attempt to drink plenty of fluids to stay well hydrated.  You may purchase mucinex over the counter and take this 2 times daily to thin your mucous.   If you develop any worsening shortness of breath, chest pain, wheezing, difficulty taking in a deep breath, or weakness/fatigue, I would like you to be reevaluated. For severe symptoms, please go to the nearest emergency department to be reevaluated. I hope you feel better! Work note is at the end of the packet.  Wear a mask until day 11 of symptoms. May go back to work on day 6 as long as you are feeling better.

## 2022-05-19 ENCOUNTER — Telehealth (HOSPITAL_COMMUNITY): Payer: Self-pay

## 2022-05-19 MED ORDER — MOLNUPIRAVIR EUA 200MG CAPSULE: 4.0000 | ORAL_CAPSULE | Freq: Two times a day (BID) | ORAL | 0 refills | Status: AC

## 2022-05-30 ENCOUNTER — Other Ambulatory Visit: Payer: Self-pay

## 2022-06-02 ENCOUNTER — Other Ambulatory Visit: Payer: Self-pay

## 2022-06-05 ENCOUNTER — Other Ambulatory Visit: Payer: Self-pay

## 2022-06-06 ENCOUNTER — Other Ambulatory Visit: Payer: Self-pay

## 2022-06-12 ENCOUNTER — Encounter: Payer: Self-pay | Admitting: Internal Medicine

## 2022-06-12 DIAGNOSIS — I10 Essential (primary) hypertension: Secondary | ICD-10-CM

## 2022-06-14 MED ORDER — CARVEDILOL 25 MG PO TABS: 25.0000 mg | ORAL_TABLET | Freq: Two times a day (BID) | ORAL | 2 refills | Status: DC

## 2022-06-21 ENCOUNTER — Encounter: Payer: Self-pay | Admitting: Internal Medicine

## 2022-09-25 ENCOUNTER — Other Ambulatory Visit: Payer: Self-pay

## 2022-09-29 ENCOUNTER — Other Ambulatory Visit: Payer: Self-pay

## 2022-10-01 ENCOUNTER — Ambulatory Visit (HOSPITAL_COMMUNITY)
Admission: EM | Admit: 2022-10-01 | Discharge: 2022-10-01 | Disposition: A | Discharge status: Home / Self Care | Payer: BC Managed Care – PPO | Attending: Physician Assistant | Admitting: Physician Assistant

## 2022-10-01 ENCOUNTER — Encounter (HOSPITAL_COMMUNITY): Payer: Self-pay

## 2022-10-01 DIAGNOSIS — K529 Noninfective gastroenteritis and colitis, unspecified: Secondary | ICD-10-CM | POA: Diagnosis not present

## 2022-10-01 DIAGNOSIS — R197 Diarrhea, unspecified: Secondary | ICD-10-CM | POA: Diagnosis not present

## 2022-10-01 DIAGNOSIS — R112 Nausea with vomiting, unspecified: Secondary | ICD-10-CM | POA: Insufficient documentation

## 2022-10-01 LAB — CBC WITH DIFFERENTIAL/PLATELET
Abs Immature Granulocytes: 0.01 10*3/uL (ref 0.00–0.07)
Basophils Absolute: 0 10*3/uL (ref 0.0–0.1)
Basophils Relative: 1 %
Eosinophils Absolute: 0.2 10*3/uL (ref 0.0–0.5)
Eosinophils Relative: 5 %
HCT: 39 % (ref 36.0–46.0)
Hemoglobin: 12.2 g/dL (ref 12.0–15.0)
Immature Granulocytes: 0 %
Lymphocytes Relative: 54 %
Lymphs Abs: 2.4 10*3/uL (ref 0.7–4.0)
MCH: 26.6 pg (ref 26.0–34.0)
MCHC: 31.3 g/dL (ref 30.0–36.0)
MCV: 85 fL (ref 80.0–100.0)
Monocytes Absolute: 0.3 10*3/uL (ref 0.1–1.0)
Monocytes Relative: 8 %
Neutro Abs: 1.4 10*3/uL — ABNORMAL LOW (ref 1.7–7.7)
Neutrophils Relative %: 32 %
Platelets: 318 10*3/uL (ref 150–400)
RBC: 4.59 MIL/uL (ref 3.87–5.11)
RDW: 14.5 % (ref 11.5–15.5)
WBC: 4.3 10*3/uL (ref 4.0–10.5)
nRBC: 0 % (ref 0.0–0.2)

## 2022-10-01 LAB — COMPREHENSIVE METABOLIC PANEL
ALT: 22 U/L (ref 0–44)
AST: 24 U/L (ref 15–41)
Albumin: 3.8 g/dL (ref 3.5–5.0)
Alkaline Phosphatase: 65 U/L (ref 38–126)
Anion gap: 10 (ref 5–15)
BUN: 10 mg/dL (ref 6–20)
CO2: 24 mmol/L (ref 22–32)
Calcium: 9.3 mg/dL (ref 8.9–10.3)
Chloride: 106 mmol/L (ref 98–111)
Creatinine, Ser: 1.1 mg/dL — ABNORMAL HIGH (ref 0.44–1.00)
GFR, Estimated: 59 mL/min — ABNORMAL LOW (ref >60)
Glucose, Bld: 83 mg/dL (ref 70–99)
Potassium: 4.2 mmol/L (ref 3.5–5.1)
Sodium: 140 mmol/L (ref 135–145)
Total Bilirubin: 0.7 mg/dL (ref 0.3–1.2)
Total Protein: 7.7 g/dL (ref 6.5–8.1)

## 2022-10-01 LAB — POC URINE PREG, ED: Preg Test, Ur: NEGATIVE

## 2022-10-01 LAB — POCT URINALYSIS DIPSTICK, ED / UC
Bilirubin Urine: NEGATIVE
Glucose, UA: NEGATIVE mg/dL
Ketones, ur: NEGATIVE mg/dL
Leukocytes,Ua: NEGATIVE
Nitrite: NEGATIVE
Protein, ur: NEGATIVE mg/dL
Specific Gravity, Urine: 1.02 (ref 1.005–1.030)
Urobilinogen, UA: 0.2 mg/dL (ref 0.0–1.0)
pH: 5 (ref 5.0–8.0)

## 2022-10-01 MED ORDER — ONDANSETRON 4 MG PO TBDP: 4.0000 mg | ORAL_TABLET | Freq: Three times a day (TID) | ORAL | 0 refills | Status: DC | PRN

## 2022-10-01 MED ORDER — ONDANSETRON 4 MG PO TBDP
ORAL_TABLET | ORAL | Status: AC
  Filled 2022-10-01: qty 1

## 2022-10-01 MED ORDER — ONDANSETRON 4 MG PO TBDP
4.0000 mg | ORAL_TABLET | Freq: Once | ORAL | Status: AC
  Administered 2022-10-01: 4 mg via ORAL

## 2022-10-01 NOTE — Discharge Instructions (Signed)
I am glad that you are feeling better after the Zofran and were able to drink water without recurrent vomiting.  Please use this on a scheduled basis (every 8 hours) for the next several days.  Eat a bland diet and avoid spicy/acidic/fatty foods.  Make sure you are drinking plenty of fluid.  We will contact you if your lab work is abnormal.  If you have any severe abdominal pain, recurrent nausea/vomiting despite medication, fever, feeling poorly you should be seen immediately.

## 2022-10-01 NOTE — ED Triage Notes (Signed)
Chief Complaint: nausea, emesis, body aches, abdominal pain (sore), chills, nausea, and fever last night, runny nose, "deep" cough that was productive.   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   New foods, medications, or products: No  Recent Travel: No

## 2022-10-01 NOTE — ED Provider Notes (Signed)
[facility]    CSN: [id number] Arrival date & time: 10/01/22  [id number]      History   Chief Complaint Chief Complaint  Patient presents with   Cough   Emesis   Fever    HPI Monique Aguilar is a 57 y.o. female.   Patient presents today with a 24-hour history of GI symptoms.  Reports generalized abdominal pain that is worse in her lower abdomen, nausea, vomiting, diarrhea.  Reports last episode of emesis was approximately 6 AM this morning and described as stomach contents.  Last episode of diarrhea was approximately 4:30 AM described as watery without blood or mucus.  She reports abdominal discomfort is rated 8 on a 0-10 pain scale, generalized throughout her abdomen but worse in the lower abdomen, described as cramping, no aggravating or alleviating factors identified.  She has tried Pepto-Bismol without improvement of symptoms.  Denies any associated melena, hematochezia, hematemesis, fever, urinary symptoms.  She is confident that she is not pregnant.  She denies history of gastrointestinal disorder including ulcerative colitis or Crohn's disease.  Denies previous abdominal surgery and still has gallbladder and appendix.  She does work at an [employer] and reports that several people have been sick with GI bug recently.    Past Medical History:  Diagnosis Date   Abnormal CT of the chest    a. 10/2013: 38m RML pulm nodule, axillary lymphadenopathy. Instructed to f/u PCP.   Anomalous right coronary artery    Atypical chest pain    a. 10/2013: suspected GI etiology. Nuc - nonischemic, EF 39% but visually higher - f/u echo to clarify showed EF 50-55% with only mild LVH, no significant valvuar disease.   Chronic diastolic congestive heart failure (HCC)    HTN (hypertension)    Low back pain    Microcytic anemia    a. No prior workup.   Migraines     Patient Active Problem List   Diagnosis Date Noted   Obesity (BMI 30.0-34.9) 07/09/2020   Leg cramps 07/09/2020   Stress  incontinence 10/08/2018   Food allergy 10/08/2018   Left breast mass 08/15/2016   Migraine without aura and without status migrainosus, not intractable 08/15/2016   Anomalous right coronary artery    Chronic diastolic congestive heart failure ([facility]    Hyperlipidemia 05/31/2014   Family history of premature coronary artery disease 05/31/2014   Morbid obesity [place] 05/31/2014   HTN (hypertension) 10/11/2013   Microcytic anemia 12/16/2012   Renal insufficiency 12/16/2012    Past Surgical History:  Procedure Laterality Date   BREAST CYST [place]   TUBAL LIGATION  1995   WISDOM TOOTH EXTRACTION  1995    OB History   No obstetric history on file.      Home Medications    Prior to Admission medications   Medication Sig Start Date End Date Taking? Authorizing Provider  acetaminophen (TYLENOL) 500 MG tablet Take 1,000 mg by mouth daily as needed for headache.   Yes [provider]  carvedilol (COREG) 25 MG tablet Take 1 tablet (25 mg total) by mouth 2 (two) times daily with a meal. 06/14/22  Yes JLadell Pier MD  furosemide (LASIX) 20 MG tablet Take 1 tablet (20 mg total) by mouth daily. 02/25/22  Yes JLadell Pier MD  ibuprofen (ADVIL) 200 MG tablet Take 400 mg by mouth daily as needed for headache.   Yes [provider]  irbesartan (AVAPRO) 150 MG tablet Take 1 tablet (150 mg total) by mouth daily. 02/25/22  Yes Ladell Pier, MD  ondansetron (ZOFRAN-ODT) 4 MG disintegrating tablet Take 1 tablet (4 mg total) by mouth every 8 (eight) hours as needed for nausea or vomiting. 10/01/22  Yes Nereida Schepp K, PA-C  potassium chloride (KLOR-CON) 10 MEQ tablet Take 1 tablet (10 mEq total) by mouth daily. 02/25/22  Yes Ladell Pier, MD  benzonatate (TESSALON) 100 MG capsule Take 1 capsule (100 mg total) by mouth every 8 (eight) hours. 05/17/22   Talbot Grumbling, FNP  EPINEPHrine 0.3 mg/0.3 mL IJ  SOAJ injection Inject 0.3 mLs (0.3 mg total) into the muscle as needed for anaphylaxis. 12/08/19   Ladell Pier, MD    Family History Family History  Problem Relation Age of Onset   Heart disease Father        Dx early 12's (? heart valve)   Heart attack Father 2   Congestive Heart Failure Mother    CAD Neg Hx    Colon cancer Neg Hx    Colon polyps Neg Hx    Esophageal cancer Neg Hx    Stomach cancer Neg Hx    Rectal cancer Neg Hx     Social History Social History   Tobacco Use   Smoking status: Never   Smokeless tobacco: Never  Vaping Use   Vaping Use: Never used  Substance Use Topics   Alcohol use: No   Drug use: No     Allergies   Almond (diagnostic), Apple, Fruit & vegetable daily [nutritional supplements], Grass extracts [gramineae pollens], and Peach [prunus persica]   Review of Systems Review of Systems  Constitutional:  Positive for activity change. Negative for appetite change, fatigue and fever.  Respiratory:  Positive for cough. Negative for shortness of breath.   Cardiovascular:  Negative for chest pain.  Gastrointestinal:  Positive for abdominal pain, diarrhea, nausea and vomiting. Negative for blood in stool and constipation.  Musculoskeletal:  Negative for arthralgias and myalgias.  Neurological:  Negative for dizziness, light-headedness and headaches.     Physical Exam Triage Vital Signs ED Triage Vitals  Enc Vitals Group     BP 10/01/22 0840 125/85     Pulse Rate 10/01/22 0840 71     Resp 10/01/22 0840 16     Temp 10/01/22 0840 98.6 F (37 C)     Temp Source 10/01/22 0840 Oral     SpO2 10/01/22 0840 97 %     Weight 10/01/22 0839 254 lb (115.2 kg)     Height 10/01/22 0839 '5\' 6"'$  (1.676 m)     Head Circumference --      Peak Flow --      Pain Score 10/01/22 0838 8     Pain Loc --      Pain Edu? --      Excl. in [place]? --    No data found.  Updated Vital Signs BP 125/85 (BP Location: Left Arm)   Pulse 71   Temp 98.6 F (37 C)  (Oral)   Resp 16   Ht '5\' 6"'$  (1.676 m)   Wt 254 lb (115.2 kg)   LMP 01/26/2018   SpO2 97%   BMI 41.00 kg/m   Visual Acuity Right Eye Distance:   Left Eye Distance:   Bilateral Distance:    Right Eye Near:   Left Eye Near:    Bilateral Near:     Physical Exam  Vitals reviewed.  Constitutional:      General: She is awake. She is not in acute distress.    Appearance: Normal appearance. She is well-developed. She is not ill-appearing.     Comments: Very pleasant female appears stated age in no acute distress sitting comfortably in exam room  HENT:     Head: Normocephalic and atraumatic.  Cardiovascular:     Rate and Rhythm: Normal rate and regular rhythm.     Heart sounds: Normal heart sounds, S1 normal and S2 normal. No murmur heard. Pulmonary:     Effort: Pulmonary effort is normal.     Breath sounds: Normal breath sounds. No wheezing, rhonchi or rales.     Comments: Clear to auscultation bilaterally Abdominal:     General: Bowel sounds are normal.     Palpations: Abdomen is soft.     Tenderness: There is abdominal tenderness in the right lower quadrant, suprapubic area and left lower quadrant. There is no right CVA tenderness, left CVA tenderness, guarding or rebound. Negative signs include Rovsing's sign, McBurney's sign and psoas sign.     Comments: Tenderness palpation throughout abdomen but worse in lower abdomen.  Negative McBurney's, Rovsing, psoas.  No evidence of acute abdomen on physical exam.  Psychiatric:        Behavior: Behavior is cooperative.      UC Treatments / Results  Labs (all labs ordered are listed, but only abnormal results are displayed) Labs Reviewed  POCT URINALYSIS DIPSTICK, ED / UC - Abnormal; Notable for the following components:      Result Value   Hgb urine dipstick SMALL (*)    All other components within normal limits  CBC WITH DIFFERENTIAL/PLATELET  COMPREHENSIVE METABOLIC PANEL  POC URINE PREG, ED    EKG   Radiology No  results found.  Procedures Procedures (including critical care time)  Medications Ordered in UC Medications  ondansetron (ZOFRAN-ODT) disintegrating tablet 4 mg (4 mg Oral Given 10/01/22 0921)    Initial Impression / Assessment and Plan / UC Course  I have reviewed the triage vital signs and the nursing notes.  Pertinent labs & imaging results that were available during my care of the patient were reviewed by me and considered in my medical decision making (see chart for details).     Patient did have recurrent nausea/vomiting immediately after Zofran dose when she attempted oral challenge.  We initially discussed going to the emergency room given she failed oral challenge but she was hesitant to do so due to concern for having to wait for a long period of time.  She was monitored for an additional amount of time and was able to drink a bottle of water without recurrent nausea or vomiting and started feeling better.  CBC, CMP obtained today and are pending.  We will contact patient if these are abnormal.  Urine pregnancy was negative.  She did have a small amount of blood in her UA but this has been present for at least 10 years based on chart review.  Suspect viral etiology given known sick contacts and clinical presentation.  She was encouraged to eat a bland diet and drink plenty of fluids.  Prescription for Zofran was provided with instruction to use on a scheduled basis for the next several days and then as needed thereafter.  Discussed that if she has any worsening symptoms including severe abdominal pain, fever, nausea/vomiting despite antiemetic medication, weakness she should be seen immediately.  Strict return precautions given.  Work  excuse note provided.  Final Clinical Impressions(s) / UC Diagnoses   Final diagnoses:  Gastroenteritis  Nausea vomiting and diarrhea     Discharge Instructions      I am glad that you are feeling better after the Zofran and were able to drink  water without recurrent vomiting.  Please use this on a scheduled basis (every 8 hours) for the next several days.  Eat a bland diet and avoid spicy/acidic/fatty foods.  Make sure you are drinking plenty of fluid.  We will contact you if your lab work is abnormal.  If you have any severe abdominal pain, recurrent nausea/vomiting despite medication, fever, feeling poorly you should be seen immediately.     ED Prescriptions     Medication Sig Dispense Auth. Provider   ondansetron (ZOFRAN-ODT) 4 MG disintegrating tablet Take 1 tablet (4 mg total) by mouth every 8 (eight) hours as needed for nausea or vomiting. 20 tablet Cadyn Fann, Derry Skill, PA-C      PDMP not reviewed this encounter.   Terrilee Croak, PA-C 10/01/22 1039

## 2022-12-11 ENCOUNTER — Encounter (HOSPITAL_BASED_OUTPATIENT_CLINIC_OR_DEPARTMENT_OTHER): Payer: BC Managed Care – PPO | Admitting: Internal Medicine

## 2022-12-11 DIAGNOSIS — U071 COVID-19: Secondary | ICD-10-CM | POA: Diagnosis not present

## 2022-12-11 MED ORDER — NIRMATRELVIR/RITONAVIR (PAXLOVID) TABLET (RENAL DOSING): 2.0000 | ORAL_TABLET | Freq: Two times a day (BID) | ORAL | 0 refills | Status: DC

## 2022-12-11 NOTE — Telephone Encounter (Signed)

## 2022-12-12 ENCOUNTER — Other Ambulatory Visit: Payer: Self-pay | Admitting: Internal Medicine

## 2022-12-12 ENCOUNTER — Other Ambulatory Visit: Payer: Self-pay

## 2022-12-12 DIAGNOSIS — U071 COVID-19: Secondary | ICD-10-CM

## 2022-12-12 MED ORDER — NIRMATRELVIR/RITONAVIR (PAXLOVID) TABLET (RENAL DOSING)
2.0000 | ORAL_TABLET | Freq: Two times a day (BID) | ORAL | 0 refills | Status: AC
  Filled 2022-12-12: qty 20, 5d supply, fill #0

## 2023-02-26 ENCOUNTER — Other Ambulatory Visit: Payer: Self-pay

## 2023-02-27 ENCOUNTER — Other Ambulatory Visit: Payer: Self-pay

## 2023-02-27 ENCOUNTER — Encounter: Payer: Self-pay | Admitting: Internal Medicine

## 2023-02-27 ENCOUNTER — Other Ambulatory Visit: Payer: Self-pay | Admitting: Internal Medicine

## 2023-02-27 DIAGNOSIS — I10 Essential (primary) hypertension: Secondary | ICD-10-CM

## 2023-02-27 MED ORDER — CARVEDILOL 25 MG PO TABS
25.0000 mg | ORAL_TABLET | Freq: Two times a day (BID) | ORAL | 0 refills | Status: DC
  Filled 2023-02-27: qty 60, 30d supply, fill #0

## 2023-02-28 ENCOUNTER — Other Ambulatory Visit: Payer: Self-pay

## 2023-03-01 MED ORDER — CARVEDILOL 12.5 MG PO TABS
12.5000 mg | ORAL_TABLET | Freq: Two times a day (BID) | ORAL | 0 refills | Status: DC
  Filled 2023-03-01: qty 60, 30d supply, fill #0

## 2023-03-01 MED ORDER — IRBESARTAN 150 MG PO TABS
150.0000 mg | ORAL_TABLET | Freq: Every day | ORAL | 0 refills | Status: DC
  Filled 2023-03-01: qty 30, 30d supply, fill #0

## 2023-03-01 MED ORDER — FUROSEMIDE 20 MG PO TABS
20.0000 mg | ORAL_TABLET | Freq: Every day | ORAL | 0 refills | Status: DC
  Filled 2023-03-01: qty 30, 30d supply, fill #0

## 2023-03-01 MED ORDER — POTASSIUM CHLORIDE ER 10 MEQ PO TBCR
10.0000 meq | EXTENDED_RELEASE_TABLET | Freq: Every day | ORAL | 0 refills | Status: DC
  Filled 2023-03-01: qty 30, 30d supply, fill #0

## 2023-03-02 ENCOUNTER — Other Ambulatory Visit: Payer: Self-pay

## 2023-03-06 ENCOUNTER — Other Ambulatory Visit: Payer: Self-pay

## 2023-03-10 ENCOUNTER — Encounter (HOSPITAL_COMMUNITY): Payer: Self-pay

## 2023-03-10 ENCOUNTER — Ambulatory Visit (HOSPITAL_COMMUNITY)
Admission: RE | Admit: 2023-03-10 | Discharge: 2023-03-10 | Disposition: A | Discharge status: Home / Self Care | Payer: BC Managed Care – PPO | Source: Ambulatory Visit | Attending: Nurse Practitioner | Admitting: Nurse Practitioner

## 2023-03-10 VITALS — BP 130/78 | HR 63 | Temp 98.5°F | Resp 16

## 2023-03-10 DIAGNOSIS — B349 Viral infection, unspecified: Secondary | ICD-10-CM | POA: Diagnosis not present

## 2023-03-10 DIAGNOSIS — R112 Nausea with vomiting, unspecified: Secondary | ICD-10-CM

## 2023-03-10 DIAGNOSIS — Z1152 Encounter for screening for COVID-19: Secondary | ICD-10-CM

## 2023-03-10 DIAGNOSIS — R197 Diarrhea, unspecified: Secondary | ICD-10-CM

## 2023-03-10 MED ORDER — ONDANSETRON 4 MG PO TBDP: 4.0000 mg | ORAL_TABLET | Freq: Three times a day (TID) | ORAL | 0 refills | Status: DC | PRN

## 2023-03-10 MED ORDER — ONDANSETRON HCL 4 MG/2ML IJ SOLN
4.0000 mg | Freq: Once | INTRAMUSCULAR | Status: AC
  Administered 2023-03-10: 4 mg via INTRAMUSCULAR

## 2023-03-10 MED ORDER — ONDANSETRON HCL 4 MG/2ML IJ SOLN
INTRAMUSCULAR | Status: AC
  Filled 2023-03-10: qty 2

## 2023-03-10 NOTE — Discharge Instructions (Addendum)
As we discussed, your symptoms are likely attributed to a viral illness.  This could be a viral stomach bug or viral upper respiratory bug or both.  We gave you an injection of Zofran today which helped prevent vomiting.  Please continue Zofran under your tongue at home every 8 hours as needed for nausea/vomiting.  Push hydration with plenty of fluids.  If you feel like eating, recommend soft, easy to digest foods or soup/broth, Jell-O, pudding, etc.  Seek care in the emergency room if you develop recurrent vomiting and are unable to keep fluids down despite the Zofran.  You may return to work once your symptoms improve and has been 24 hours.

## 2023-03-10 NOTE — ED Provider Notes (Signed)
[facility]    CSN: [id number] Arrival date & time: 03/10/23  0845      History   Chief Complaint Chief Complaint  Patient presents with   Diarrhea   Emesis   Nausea    HPI Monique Aguilar is a 57 y.o. female.   Patient presents today with 1 day history of bodyaches, chills, abdominal cramping, currently 7 out of 10 and constant, nausea, vomiting, and diarrhea.  Reports 2 episodes of emesis and 2 episodes of diarrhea today, both nonbloody.  Has not been able to eat or drink in the past 24 hours because she throws up everything.  No known fevers, lightheadedness or dizziness, recent travel, recent suspicious drinking water, or recent fast food intake.  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  She took leftover Zofran and Pepto-Bismol at home, both which she vomited up.      Past Medical History:  Diagnosis Date   Abnormal CT of the chest    a. 10/2013: 4mm RML pulm nodule, axillary lymphadenopathy. Instructed to f/u PCP.   Anomalous right coronary artery    Atypical chest pain    a. 10/2013: suspected GI etiology. Nuc - nonischemic, EF 39% but visually higher - f/u echo to clarify showed EF 50-55% with only mild LVH, no significant valvuar disease.   Chronic diastolic congestive heart failure (HCC)    HTN (hypertension)    Low back pain    Microcytic anemia    a. No prior workup.   Migraines     Patient Active Problem List   Diagnosis Date Noted   Obesity (BMI 30.0-34.9) 07/09/2020   Leg cramps 07/09/2020   Stress incontinence 10/08/2018   Food allergy 10/08/2018   Left breast mass 08/15/2016   Migraine without aura and without status migrainosus, not intractable 08/15/2016   Anomalous right coronary artery    Chronic diastolic congestive heart failure (HCC)    Hyperlipidemia 05/31/2014   Family history of premature coronary artery disease 05/31/2014   Morbid  obesity (HCC) 05/31/2014   HTN (hypertension) 10/11/2013   Microcytic anemia 12/16/2012   Renal insufficiency 12/16/2012    Past Surgical History:  Procedure Laterality Date   BREAST CYST ASPIRATION     CESAREAN SECTION  1988   TONSILLECTOMY  1992?   TUBAL LIGATION  1995   WISDOM TOOTH EXTRACTION  1995    OB History   No obstetric history on file.      Home Medications    Prior to Admission medications   Medication Sig Start Date End Date Taking? Authorizing Provider  acetaminophen (TYLENOL) 500 MG tablet Take 1,000 mg by mouth daily as needed for headache.   Yes [provider]  carvedilol (COREG) 12.5 MG tablet Take 1 tablet (12.5 mg total) by mouth 2 (two) times daily with a meal. 03/01/23  Yes Marcine Matar, MD  carvedilol (COREG) 25 MG tablet Take 1 tablet (25 mg total) by mouth 2 (two) times daily with a meal. NEEDS TO BE SEEN PRIOR TO NEXT REFILL REQUEST. 02/27/23  Yes Marcine Matar, MD  EPINEPHrine 0.3 mg/0.3 mL IJ SOAJ injection Inject 0.3 mLs (0.3 mg total) into the muscle as needed for anaphylaxis. 12/08/19  Yes Marcine Matar, MD  furosemide (LASIX) 20 MG tablet Take 1 tablet (20 mg total) by mouth daily. 03/01/23  Yes Marcine Matar, MD  irbesartan (AVAPRO) 150 MG tablet Take 1 tablet (150 mg total) by mouth daily. NEEDS VISIT PRIOR TO NEXT REFILL REQUEST 03/01/23  Yes Marcine Matar, MD  ondansetron (ZOFRAN-ODT) 4 MG disintegrating tablet Take 1 tablet (4 mg total) by mouth every 8 (eight) hours as needed for nausea or vomiting. 03/10/23  Yes Cathlean Marseilles A, NP  potassium chloride (KLOR-CON) 10 MEQ tablet Take 1 tablet (10 mEq total) by mouth daily. 03/01/23  Yes Marcine Matar, MD  benzonatate (TESSALON) 100 MG capsule Take 1 capsule (100 mg total) by mouth every 8 (eight) hours. 05/17/22   Carlisle Beers, FNP  ibuprofen (ADVIL) 200 MG tablet Take 400 mg by mouth daily as needed for headache.    [provider]     Family History Family History  Problem Relation Age of Onset   Heart disease Father        Dx early 110's (? heart valve)   Heart attack Father 52   Congestive Heart Failure Mother    CAD Neg Hx    Colon cancer Neg Hx    Colon polyps Neg Hx    Esophageal cancer Neg Hx    Stomach cancer Neg Hx    Rectal cancer Neg Hx     Social History Social History   Tobacco Use   Smoking status: Never   Smokeless tobacco: Never  Vaping Use   Vaping status: Never Used  Substance Use Topics   Alcohol use: No   Drug use: No     Allergies   Almond (diagnostic), Apple, Fruit & vegetable daily [nutritional supplements], Grass extracts [gramineae pollens], and Peach [prunus persica]   Review of Systems Review of Systems Per HPI  Physical Exam Triage Vital Signs ED Triage Vitals [03/10/23 0909]  Encounter Vitals Group     BP 130/78     Systolic BP Percentile      Diastolic BP Percentile      Pulse Rate 63     Resp 16     Temp 98.5 F (36.9 C)     Temp Source Oral     SpO2 98 %     Weight      Height      Head Circumference      Peak Flow      Pain Score      Pain Loc      Pain Education      Exclude from Growth Chart    No data found.  Updated Vital Signs BP 130/78 (BP Location: Left Arm)   Pulse 63   Temp 98.5 F (36.9 C) (Oral)   Resp 16   LMP 01/26/2018   SpO2 98%   Visual Acuity Right Eye Distance:   Left Eye Distance:   Bilateral Distance:    Right Eye Near:   Left Eye Near:    Bilateral Near:     Physical Exam Vitals and nursing note reviewed.  Constitutional:      General: She is not in acute distress.    Appearance: Normal appearance. She is not toxic-appearing.  HENT:     Head: Normocephalic and atraumatic.     Mouth/Throat:     Mouth: Mucous membranes are moist.     Pharynx: Oropharynx is clear. No oropharyngeal exudate or posterior oropharyngeal erythema.  Eyes:     General: No scleral icterus.    Extraocular Movements: Extraocular  movements intact.  Cardiovascular:     Rate and Rhythm: Normal rate and regular  rhythm.  Pulmonary:     Effort: Pulmonary effort is normal. No respiratory distress.     Breath sounds: Normal breath sounds. No wheezing, rhonchi or rales.  Abdominal:     General: Abdomen is flat. Bowel sounds are normal. There is no distension.     Palpations: Abdomen is soft.     Tenderness: There is generalized abdominal tenderness. There is no right CVA tenderness, left CVA tenderness, guarding or rebound.  Musculoskeletal:     Cervical back: Normal range of motion.  Lymphadenopathy:     Cervical: No cervical adenopathy.  Skin:    General: Skin is warm and dry.     Capillary Refill: Capillary refill takes less than 2 seconds.     Coloration: Skin is not jaundiced or pale.     Findings: No erythema.  Neurological:     Mental Status: She is alert and oriented to person, place, and time.  Psychiatric:        Behavior: Behavior is cooperative.      UC Treatments / Results  Labs (all labs ordered are listed, but only abnormal results are displayed) Labs Reviewed  SARS CORONAVIRUS 2 (TAT 6-24 HRS)    EKG   Radiology No results found.  Procedures Procedures (including critical care time)  Medications Ordered in UC Medications  ondansetron (ZOFRAN) injection 4 mg (4 mg Intramuscular Given 03/10/23 0943)    Initial Impression / Assessment and Plan / UC Course  I have reviewed the triage vital signs and the nursing notes.  Pertinent labs & imaging results that were available during my care of the patient were reviewed by me and considered in my medical decision making (see chart for details).   Patient is well-appearing, normotensive, afebrile, not tachycardic, not tachypneic, oxygenating well on room air.    1. Nausea vomiting and diarrhea 2. Viral illness 3. Encounter for screening for COVID-19 Vitals and exam today are reassuring Suspect viral illness Zofran IM given in urgent  care, patient able to tolerate sips of water after without vomiting Continue sublingual Zofran at home every 8 hours, push hydration Strict ER and return precautions discussed with patient Work excuse given  The patient was given the opportunity to ask questions.  All questions answered to their satisfaction.  The patient is in agreement to this plan.    Final Clinical Impressions(s) / UC Diagnoses   Final diagnoses:  Nausea vomiting and diarrhea  Viral illness  Encounter for screening for COVID-19     Discharge Instructions      As we discussed, your symptoms are likely attributed to a viral illness.  This could be a viral stomach bug or viral upper respiratory bug or both.  We gave you an injection of Zofran today which helped prevent vomiting.  Please continue Zofran under your tongue at home every 8 hours as needed for nausea/vomiting.  Push hydration with plenty of fluids.  If you feel like eating, recommend soft, easy to digest foods or soup/broth, Jell-O, pudding, etc.  Seek care in the emergency room if you develop recurrent vomiting and are unable to keep fluids down despite the Zofran.  You may return to work once your symptoms improve and has been 24 hours.     ED Prescriptions     Medication Sig Dispense Auth. Provider   ondansetron (ZOFRAN-ODT) 4 MG disintegrating tablet Take 1 tablet (4 mg total) by mouth every 8 (eight) hours as needed for nausea or vomiting. 20 tablet Valentino Nose, NP  PDMP not reviewed this encounter.   Valentino Nose, NP 03/10/23 1040

## 2023-03-10 NOTE — ED Triage Notes (Signed)
Pt presents to the office for vomiting and diarrhea x 2-3 days. Pt has not been able to keep anything down. Pt took zofran last night.

## 2023-05-22 ENCOUNTER — Encounter: Payer: Self-pay | Admitting: Internal Medicine

## 2023-05-22 ENCOUNTER — Other Ambulatory Visit: Payer: Self-pay

## 2023-05-22 ENCOUNTER — Ambulatory Visit: Payer: BC Managed Care – PPO | Attending: Internal Medicine | Admitting: Internal Medicine

## 2023-05-22 VITALS — BP 117/77 | HR 74 | Temp 98.4°F | Ht 66.0 in | Wt 257.0 lb

## 2023-05-22 DIAGNOSIS — Z23 Encounter for immunization: Secondary | ICD-10-CM | POA: Diagnosis not present

## 2023-05-22 DIAGNOSIS — I10 Essential (primary) hypertension: Secondary | ICD-10-CM | POA: Diagnosis not present

## 2023-05-22 DIAGNOSIS — R5383 Other fatigue: Secondary | ICD-10-CM

## 2023-05-22 DIAGNOSIS — E66813 Obesity, class 3: Secondary | ICD-10-CM | POA: Diagnosis not present

## 2023-05-22 DIAGNOSIS — Z91018 Allergy to other foods: Secondary | ICD-10-CM

## 2023-05-22 DIAGNOSIS — Z1231 Encounter for screening mammogram for malignant neoplasm of breast: Secondary | ICD-10-CM

## 2023-05-22 DIAGNOSIS — Z6841 Body Mass Index (BMI) 40.0 and over, adult: Secondary | ICD-10-CM

## 2023-05-22 DIAGNOSIS — Z1211 Encounter for screening for malignant neoplasm of colon: Secondary | ICD-10-CM

## 2023-05-22 MED ORDER — FUROSEMIDE 20 MG PO TABS
20.0000 mg | ORAL_TABLET | Freq: Every day | ORAL | 1 refills | Status: AC
  Filled 2023-05-22 – 2024-03-27 (×2): qty 90, 90d supply, fill #0

## 2023-05-22 MED ORDER — CARVEDILOL 12.5 MG PO TABS
12.5000 mg | ORAL_TABLET | Freq: Two times a day (BID) | ORAL | 1 refills | Status: DC
Start: 2023-05-22 — End: 2023-08-31
  Filled 2023-05-22: qty 180, 90d supply, fill #0

## 2023-05-22 MED ORDER — IRBESARTAN 150 MG PO TABS
150.0000 mg | ORAL_TABLET | Freq: Every day | ORAL | 1 refills | Status: DC
Start: 2023-05-22 — End: 2023-08-31
  Filled 2023-05-22: qty 90, 90d supply, fill #0

## 2023-05-22 MED ORDER — EPINEPHRINE 0.3 MG/0.3ML IJ SOAJ
0.3000 mg | INTRAMUSCULAR | 1 refills | Status: AC | PRN
Start: 2023-05-22 — End: ?
  Filled 2023-05-22: qty 2, 2d supply, fill #0

## 2023-05-22 MED ORDER — POTASSIUM CHLORIDE ER 10 MEQ PO TBCR
10.0000 meq | EXTENDED_RELEASE_TABLET | Freq: Every day | ORAL | 1 refills | Status: AC
Start: 2023-05-22 — End: ?
  Filled 2023-05-22 – 2024-03-27 (×2): qty 90, 90d supply, fill #0

## 2023-05-22 NOTE — Progress Notes (Signed)
Patient ID: Monique BILLINGSLY, female    DOB: June 25, 1966  MRN: [id number]  CC: Hypertension (HTN f/u. Med refills./Reports increased fatigue "feeling tired / sluggish" even with more hours of sleep. /Flu & shingles vax adminstered on 05/22/2023 - C.A/Yes to mammogram & colonoscopy referral)   Subjective: Monique Aguilar is a 57 y.o. female who presents for chronic ds management. Her concerns today include:  Pt with hx of HTN,  dCHF, anomalous RCA, anemia, migraines, obesity, HL, fhx of CAD, obesity   HYPERTENSION Currently taking: see medication list.  Should be on carvedilol 25 mg twice a day, furosemide 20 mg daily, potassium supplement 10 mEq daily and Avapro 150 mg daily.  Ran out of the Coreg 25 mg for 2 wks so started taking the 12.5 mg one tab BID as she still had some.  Med Adherence: [x]  Yes    []  No Medication side effects: []  Yes    [x]  No Adherence with salt restriction: [x]  Yes    []  No Home Monitoring?: [x]  Yes    []  No Monitoring Frequency: 2x/wk Home BP results range: Highest 145/90 twice in past 3 wks.  Usually 118/79, SBP never over 120 SOB? []  Yes    [x]  No Chest Pain?: []  Yes    [x]  No Leg swelling?: []  Yes    [x]  No Headaches?: []  Yes    [x]  No Dizziness? []  Yes    [x]  No Comments:   Complains of feeling fatigue and sluggish for the past 4 weeks even though she gets adequate sleep at night.  She is home from work by at least 5 PM.  When she gets home she feels she has enough energy just to eat, take a shower and then get in bed.  In bed at 8:30 p.m but not sleepy until about 11 p.m; nn TIC Tok or watches TV until then.  Never told that she snores or stops breathing in sleep.  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   Endorses morning headaches at times  Obesity:  eating more fruits like berries, more veggies.  Stopped eating pork over 1 yr ago.  Needs to cut back on sweets - cakes, donuts and candies.  Does a lot  of walking on her job.  On weekends when she is off, she walks daily for 15 minutes.  HM: Yes to the flu shot and Shingrix vaccine series.  Due for mammogram, colon cancer screening and Pap smear.  No fhx of colon CA Patient Active Problem List   Diagnosis Date Noted   Obesity (BMI 30.0-34.9) 07/09/2020   Leg cramps 07/09/2020   Stress incontinence 10/08/2018   Food allergy 10/08/2018   Left breast mass 08/15/2016   Migraine without aura and without status migrainosus, not intractable 08/15/2016   Anomalous right coronary artery    Chronic diastolic congestive heart failure (HCC)    Hyperlipidemia 05/31/2014   Family history of premature coronary artery disease 05/31/2014   Morbid obesity (HCC) 05/31/2014   HTN (hypertension) 10/11/2013   Microcytic anemia 12/16/2012   Renal insufficiency 12/16/2012     Current Outpatient Medications on File Prior to Visit  Medication Sig Dispense Refill   acetaminophen (TYLENOL) 500 MG tablet Take 1,000 mg by mouth daily as needed for headache.     benzonatate (TESSALON) 100 MG capsule Take 1 capsule (100 mg total) by mouth every 8 (eight) hours. 21 capsule 0  ibuprofen (ADVIL) 200 MG tablet Take 400 mg by mouth daily as needed for headache.     ondansetron (ZOFRAN-ODT) 4 MG disintegrating tablet Take 1 tablet (4 mg total) by mouth every 8 (eight) hours as needed for nausea or vomiting. 20 tablet 0   No current facility-administered medications on file prior to visit.    Allergies  Allergen Reactions   Almond (Diagnostic) Anaphylaxis   Apple Anaphylaxis   Fruit & Vegetable Daily [Nutritional Supplements] Anaphylaxis    Strawberries or other fruit   Grass Extracts [Gramineae Pollens] Other (See Comments)    Watery eyes, sneezing   Peach [Prunus Persica] Nausea And Vomiting    Social History   Socioeconomic History   Marital status: Divorced    Spouse name: Not on file   Number of children: Not on file   Years of education: Not on file    Highest education level: Not on file  Occupational History   Not on file  Tobacco Use   Smoking status: Never   Smokeless tobacco: Never  Vaping Use   Vaping status: Never Used  Substance and Sexual Activity   Alcohol use: No   Drug use: No   Sexual activity: Not Currently    Birth control/protection: Surgical  Other Topics Concern   Not on file  Social History Narrative   Not on file   Social Determinants of Health   Financial Resource Strain: Low Risk  (05/22/2023)   Overall Financial Resource Strain (CARDIA)    Difficulty of Paying Living Expenses: Not hard at all  Food Insecurity: No Food Insecurity (05/22/2023)   Hunger Vital Sign    Worried About Running Out of Food in the Last Year: Never true    Ran Out of Food in the Last Year: Never true  Transportation Needs: No Transportation Needs (05/22/2023)   PRAPARE - Administrator, Civil Service (Medical): No    Lack of Transportation (Non-Medical): No  Physical Activity: Insufficiently Active (05/22/2023)   Exercise Vital Sign    Days of Exercise per Week: 5 days    Minutes of Exercise per Session: 20 min  Stress: No Stress Concern Present (05/22/2023)   [employer] of Occupational Health - Occupational Stress Questionnaire    Feeling of Stress : Not at all  Social Connections: Moderately Integrated (05/22/2023)   Social Connection and Isolation Panel [NHANES]    Frequency of Communication with Friends and Family: Three times a week    Frequency of Social Gatherings with Friends and Family: Once a week    Attends Religious Services: More than 4 times per year    Active Member of [employer] or Organizations: Yes    Attends Banker Meetings: 1 to 4 times per year    Marital Status: Divorced  Catering manager Violence: Not At Risk (05/22/2023)   Humiliation, Afraid, Rape, and Kick questionnaire    Fear of Current or Ex-Partner: No    Emotionally Abused: No    Physically Abused: No     Sexually Abused: No    Family History  Problem Relation Age of Onset   Heart disease Father        Dx early 41's (? heart valve)   Heart attack Father 78   Congestive Heart Failure Mother    CAD Neg Hx    Colon cancer Neg Hx    Colon polyps Neg Hx    Esophageal cancer Neg Hx    Stomach cancer Neg Hx  Rectal cancer Neg Hx     Past Surgical History:  Procedure Laterality Date   BREAST CYST ASPIRATION     CESAREAN SECTION  1988   TONSILLECTOMY  1992?   TUBAL LIGATION  1995   WISDOM TOOTH EXTRACTION  1995    ROS: Review of Systems Negative except as stated above  PHYSICAL EXAM: BP 117/77 (BP Location: Left Arm, Patient Position: Sitting, Cuff Size: Large)   Pulse 74   Temp 98.4 F (36.9 C) (Oral)   Ht 5\' 6"  (1.676 m)   Wt 257 lb (116.6 kg)   LMP 01/26/2018   SpO2 97%   BMI 41.48 kg/m   Wt Readings from Last 3 Encounters:  05/22/23 257 lb (116.6 kg)  10/01/22 254 lb (115.2 kg)  04/17/22 224 lb (101.6 kg)    Physical Exam  General appearance - alert, well appearing, and in no distress Mental status - normal mood, behavior, speech, dress, motor activity, and thought processes Neck - supple, no significant adenopathy Chest - clear to auscultation, no wheezes, rales or rhonchi, symmetric air entry Heart - normal rate, regular rhythm, normal S1, S2, no murmurs, rubs, clicks or gallops Extremities - peripheral pulses normal, no pedal edema, no clubbing or cyanosis      Latest Ref Rng & Units 10/01/2022    8:53 AM 04/17/2022   11:04 AM 02/25/2022    2:28 PM  CMP  Glucose 70 - 99 mg/dL 83  89  84   BUN 6 - 20 mg/dL 10  12  15    Creatinine 0.44 - 1.00 mg/dL 1.61  0.96  0.45   Sodium 135 - 145 mmol/L 140  140  141   Potassium 3.5 - 5.1 mmol/L 4.2  4.3  4.6   Chloride 98 - 111 mmol/L 106  107  103   CO2 22 - 32 mmol/L 24  25  25    Calcium 8.9 - 10.3 mg/dL 9.3  9.5  9.9   Total Protein 6.5 - 8.1 g/dL 7.7   8.0   Total Bilirubin 0.3 - 1.2 mg/dL 0.7   0.2    Alkaline Phos 38 - 126 U/L 65   93   AST 15 - 41 U/L 24   16   ALT 0 - 44 U/L 22   17    Lipid Panel     Component Value Date/Time   CHOL 196 02/25/2022 1428   TRIG 179 (H) 02/25/2022 1428   HDL 48 02/25/2022 1428   CHOLHDL 4.1 02/25/2022 1428   CHOLHDL 3.1 10/11/2013 0125   VLDL 32 10/11/2013 0125   LDLCALC 117 (H) 02/25/2022 1428    CBC    Component Value Date/Time   WBC 4.3 10/01/2022 0853   RBC 4.59 10/01/2022 0853   HGB 12.2 10/01/2022 0853   HGB 13.0 02/25/2022 1428   HCT 39.0 10/01/2022 0853   HCT 39.5 02/25/2022 1428   PLT 318 10/01/2022 0853   PLT 333 02/25/2022 1428   MCV 85.0 10/01/2022 0853   MCV 80 02/25/2022 1428   MCH 26.6 10/01/2022 0853   MCHC 31.3 10/01/2022 0853   RDW 14.5 10/01/2022 0853   RDW 13.7 02/25/2022 1428   LYMPHSABS 2.4 10/01/2022 0853   LYMPHSABS 3.1 09/08/2019 1912   MONOABS 0.3 10/01/2022 0853   EOSABS 0.2 10/01/2022 0853   EOSABS 0.1 09/08/2019 1912   BASOSABS 0.0 10/01/2022 0853   BASOSABS 0.0 09/08/2019 1912    ASSESSMENT AND PLAN: 1. Essential hypertension At goal. Since  blood pressure is good on the lower dose of the carvedilol, we will keep her on the 12.5 mg twice a day.  Continue furosemide 20 mg daily, Avapro 150 mg daily - CBC - Comprehensive metabolic panel - Lipid panel - potassium chloride (KLOR-CON) 10 MEQ tablet; Take 1 tablet (10 mEq total) by mouth daily.  Dispense: 90 tablet; Refill: 1 - irbesartan (AVAPRO) 150 MG tablet; Take 1 tablet (150 mg total) by mouth daily.  Dispense: 90 tablet; Refill: 1 - furosemide (LASIX) 20 MG tablet; Take 1 tablet (20 mg total) by mouth daily.  Dispense: 90 tablet; Refill: 1 - carvedilol (COREG) 12.5 MG tablet; Take 1 tablet (12.5 mg total) by mouth 2 (two) times daily with a meal.  Dispense: 180 tablet; Refill: 1  2. Other fatigue Good sleep hygiene discussed and encouraged. Patient advised not to drink any caffeinated beverages or excessive alcohol use within several hours of  bedtime.  Advised to get in bed around about the same time every night.  Once in bed, turn off all lights and sounds.  If unable to fall asleep within 30 to 45 minutes of getting in bed, patient should get up and try to do something until she feels sleepy again.  At that time try getting back in bed. Encouraged her to try to increase her exercise as this helps with overall sense of wellbeing and also helps with better sleep If she improves on her sleep hygiene and exercise and fatigue persists, we can put her in for a sleep study.  Patient is agreeable to this. Also advised send she is postmenopausal and probably not getting enough sunlight, to take vitamin D 400 international units daily   3. Class 3 severe obesity due to excess calories with serious comorbidity and body mass index (BMI) of 40.0 to 44.9 in adult [facility]) Discussed and encouraged healthy eating habits.  Advised her to cut back on sugary snacks  4. Food allergy Patient requested refill on EpiPen. - EPINEPHrine 0.3 mg/0.3 mL IJ SOAJ injection; Inject 0.3 mg into the muscle as needed for anaphylaxis.  Dispense: 2 each; Refill: 1  5. Encounter for immunization Given. - Flu vaccine trivalent PF, 6mos and older(Flulaval,Afluria,Fluarix,Fluzone)  6. Need for shingles vaccine First Shingrix vaccine given today.  7. Encounter for screening mammogram for malignant neoplasm of breast - MM Digital Screening; Future  8. Screening for colon cancer - Cologuard   Patient was given the opportunity to ask questions.  Patient verbalized understanding of the plan and was able to repeat key elements of the plan.   This documentation was completed using Paediatric nurse.  Any transcriptional errors are unintentional.  Orders Placed This Encounter  Procedures   MM Digital Screening   Varicella-zoster vaccine IM   Flu vaccine trivalent PF, 6mos and older(Flulaval,Afluria,Fluarix,Fluzone)   CBC   Comprehensive metabolic  panel   Lipid panel   Cologuard     Requested Prescriptions   Signed Prescriptions Disp Refills   EPINEPHrine 0.3 mg/0.3 mL IJ SOAJ injection 2 each 1    Sig: Inject 0.3 mg into the muscle as needed for anaphylaxis.   potassium chloride (KLOR-CON) 10 MEQ tablet 90 tablet 1    Sig: Take 1 tablet (10 mEq total) by mouth daily.   irbesartan (AVAPRO) 150 MG tablet 90 tablet 1    Sig: Take 1 tablet (150 mg total) by mouth daily.   furosemide (LASIX) 20 MG tablet 90 tablet 1    Sig: Take 1  tablet (20 mg total) by mouth daily.   carvedilol (COREG) 12.5 MG tablet 180 tablet 1    Sig: Take 1 tablet (12.5 mg total) by mouth 2 (two) times daily with a meal.    Return in about 4 months (around 09/22/2023).  Jonah Blue, MD, FACP

## 2023-05-22 NOTE — Patient Instructions (Signed)
Good sleep hygiene discussed and encouraged. Patient advised not to drink any caffeinated beverages or excessive alcohol use within several hours of bedtime.  Advised to get in bed around about the same time every night.  Once in bed, turn off all lights and sounds.  If unable to fall asleep within 30 to 45 minutes of getting in bed, patient should get up and try to do something until she feels sleepy again.  At that time try getting back in bed.  Take vitamin D 400 international units once a day.  This can be purchased over-the-counter.  Healthy Eating, Adult Healthy eating may help you get and keep a healthy body weight, reduce the risk of chronic disease, and live a long and productive life. It is important to follow a healthy eating pattern. Your nutritional and calorie needs should be met mainly by different nutrient-rich foods. What are tips for following this plan? Reading food labels Read labels and choose the following: Reduced or low sodium products. Juices with 100% fruit juice. Foods with low saturated fats (<3 g per serving) and high polyunsaturated and monounsaturated fats. Foods with whole grains, such as whole wheat, cracked wheat, brown rice, and wild rice. Whole grains that are fortified with folic acid. This is recommended for females who are pregnant or who want to become pregnant. Read labels and do not eat or drink the following: Foods or drinks with added sugars. These include foods that contain brown sugar, corn sweetener, corn syrup, dextrose, fructose, glucose, high-fructose corn syrup, honey, invert sugar, lactose, malt syrup, maltose, molasses, raw sugar, sucrose, trehalose, or turbinado sugar. Limit your intake of added sugars to less than 10% of your total daily calories. Do not eat more than the following amounts of added sugar per day: 6 teaspoons (25 g) for females. 9 teaspoons (38 g) for males. Foods that contain processed or refined starches and grains. Refined  grain products, such as white flour, degermed cornmeal, white bread, and white rice. Shopping Choose nutrient-rich snacks, such as vegetables, whole fruits, and nuts. Avoid high-calorie and high-sugar snacks, such as potato chips, fruit snacks, and candy. Use oil-based dressings and spreads on foods instead of solid fats such as butter, margarine, sour cream, or cream cheese. Limit pre-made sauces, mixes, and "instant" products such as flavored rice, instant noodles, and ready-made pasta. Try more plant-protein sources, such as tofu, tempeh, black beans, edamame, lentils, nuts, and seeds. Explore eating plans such as the Mediterranean diet or vegetarian diet. Try heart-healthy dips made with beans and healthy fats like hummus and guacamole. Vegetables go great with these. Cooking Use oil to saut or stir-fry foods instead of solid fats such as butter, margarine, or lard. Try baking, boiling, grilling, or broiling instead of frying. Remove the fatty part of meats before cooking. Steam vegetables in water or broth. Meal planning  At meals, imagine dividing your plate into fourths: One-half of your plate is fruits and vegetables. One-fourth of your plate is whole grains. One-fourth of your plate is protein, especially lean meats, poultry, eggs, tofu, beans, or nuts. Include low-fat dairy as part of your daily diet. Lifestyle Choose healthy options in all settings, including home, work, school, restaurants, or stores. Prepare your food safely: Wash your hands after handling raw meats. Where you prepare food, keep surfaces clean by regularly washing with hot, soapy water. Keep raw meats separate from ready-to-eat foods, such as fruits and vegetables. Cook seafood, meat, poultry, and eggs to the recommended temperature. Get a food  thermometer. Store foods at safe temperatures. In general: Keep cold foods at 65F (4.4C) or below. Keep hot foods at 165F (60C) or above. Keep your freezer at  [facility] (-17.8C) or below. Foods are not safe to eat if they have been between the temperatures of 40-165F (4.4-60C) for more than 2 hours. What foods should I eat? Fruits Aim to eat 1-2 cups of fresh, canned (in natural juice), or frozen fruits each day. One cup of fruit equals 1 small apple, 1 large banana, 8 large strawberries, 1 cup (237 g) canned fruit,  cup (82 g) dried fruit, or 1 cup (240 mL) 100% juice. Vegetables Aim to eat 2-4 cups of fresh and frozen vegetables each day, including different varieties and colors. One cup of vegetables equals 1 cup (91 g) broccoli or cauliflower florets, 2 medium carrots, 2 cups (150 g) raw, leafy greens, 1 large tomato, 1 large bell pepper, 1 large sweet potato, or 1 medium white potato. Grains Aim to eat 5-10 ounce-equivalents of whole grains each day. Examples of 1 ounce-equivalent of grains include 1 slice of bread, 1 cup (40 g) ready-to-eat cereal, 3 cups (24 g) popcorn, or  cup (93 g) cooked rice. Meats and other proteins Try to eat 5-7 ounce-equivalents of protein each day. Examples of 1 ounce-equivalent of protein include 1 egg,  oz nuts (12 almonds, 24 pistachios, or 7 walnut halves), 1/4 cup (90 g) cooked beans, 6 tablespoons (90 g) hummus or 1 tablespoon (16 g) peanut butter. A cut of meat or fish that is the size of a deck of cards is about 3-4 ounce-equivalents (85 g). Of the protein you eat each week, try to have at least 8 sounce (227 g) of seafood. This is about 2 servings per week. This includes salmon, trout, herring, sardines, and anchovies. Dairy Aim to eat 3 cup-equivalents of fat-free or low-fat dairy each day. Examples of 1 cup-equivalent of dairy include 1 cup (240 mL) milk, 8 ounces (250 g) yogurt, 1 ounces (44 g) natural cheese, or 1 cup (240 mL) fortified soy milk. Fats and oils Aim for about 5 teaspoons (21 g) of fats and oils per day. Choose monounsaturated fats, such as canola and olive oils, mayonnaise made with olive oil  or avocado oil, avocados, peanut butter, and most nuts, or polyunsaturated fats, such as sunflower, corn, and soybean oils, walnuts, pine nuts, sesame seeds, sunflower seeds, and flaxseed. Beverages Aim for 6 eight-ounce glasses of water per day. Limit coffee to 3-5 eight-ounce cups per day. Limit caffeinated beverages that have added calories, such as soda and energy drinks. If you drink alcohol: Limit how much you have to: 0-1 drink a day if you are female. 0-2 drinks a day if you are female. Know how much alcohol is in your drink. In the [place] one drink is one 12 oz bottle of beer (355 mL), one 5 oz glass of wine (148 mL), or one 1 oz glass of hard liquor (44 mL). Seasoning and other foods Try not to add too much salt to your food. Try using herbs and spices instead of salt. Try not to add sugar to food. This information is based on [place] nutrition guidelines. To learn more, visit [employer].be. Exact amounts may vary. You may need different amounts. This information is not intended to replace advice given to you by your health care provider. Make sure you discuss any questions you have with your health care provider. Document Revised: 04/21/2022 Document Reviewed: 04/21/2022 Elsevier Patient Education  2024 [employer]

## 2023-05-23 LAB — LIPID PANEL
Chol/HDL Ratio: 3.4 {ratio} (ref 0.0–4.4)
Cholesterol, Total: 195 mg/dL (ref 100–199)
HDL: 57 mg/dL (ref >39)
LDL Chol Calc (NIH): 124 mg/dL — ABNORMAL HIGH (ref 0–99)
Triglycerides: 74 mg/dL (ref 0–149)
VLDL Cholesterol Cal: 14 mg/dL (ref 5–40)

## 2023-05-23 LAB — COMPREHENSIVE METABOLIC PANEL
ALT: 16 [IU]/L (ref 0–32)
AST: 17 [IU]/L (ref 0–40)
Albumin: 4.3 g/dL (ref 3.8–4.9)
Alkaline Phosphatase: 79 [IU]/L (ref 44–121)
BUN/Creatinine Ratio: 14 (ref 9–23)
BUN: 17 mg/dL (ref 6–24)
Bilirubin Total: 0.3 mg/dL (ref 0.0–1.2)
CO2: 23 mmol/L (ref 20–29)
Calcium: 9.6 mg/dL (ref 8.7–10.2)
Chloride: 103 mmol/L (ref 96–106)
Creatinine, Ser: 1.24 mg/dL — ABNORMAL HIGH (ref 0.57–1.00)
Globulin, Total: 3.2 g/dL (ref 1.5–4.5)
Glucose: 81 mg/dL (ref 70–99)
Potassium: 4.3 mmol/L (ref 3.5–5.2)
Sodium: 140 mmol/L (ref 134–144)
Total Protein: 7.5 g/dL (ref 6.0–8.5)
eGFR: 51 mL/min/{1.73_m2} — ABNORMAL LOW (ref >59)

## 2023-05-23 LAB — CBC
Hematocrit: 36.4 % (ref 34.0–46.6)
Hemoglobin: 11.9 g/dL (ref 11.1–15.9)
MCH: 26.4 pg — ABNORMAL LOW (ref 26.6–33.0)
MCHC: 32.7 g/dL (ref 31.5–35.7)
MCV: 81 fL (ref 79–97)
Platelets: 326 10*3/uL (ref 150–450)
RBC: 4.5 x10E6/uL (ref 3.77–5.28)
RDW: 13.7 % (ref 11.7–15.4)
WBC: 3.9 10*3/uL (ref 3.4–10.8)

## 2023-05-31 ENCOUNTER — Encounter: Payer: Self-pay | Admitting: Internal Medicine

## 2023-06-02 ENCOUNTER — Other Ambulatory Visit: Payer: Self-pay

## 2023-06-03 ENCOUNTER — Other Ambulatory Visit: Payer: Self-pay

## 2023-08-26 ENCOUNTER — Encounter: Payer: Self-pay | Admitting: Internal Medicine

## 2023-08-30 ENCOUNTER — Encounter: Payer: Self-pay | Admitting: Internal Medicine

## 2023-08-31 ENCOUNTER — Encounter: Payer: Self-pay | Admitting: Internal Medicine

## 2023-08-31 ENCOUNTER — Other Ambulatory Visit: Payer: Self-pay

## 2023-08-31 ENCOUNTER — Ambulatory Visit: Payer: BC Managed Care – PPO | Attending: Internal Medicine | Admitting: Internal Medicine

## 2023-08-31 VITALS — BP 119/81 | HR 70 | Temp 98.5°F | Ht 66.0 in | Wt 258.0 lb

## 2023-08-31 DIAGNOSIS — K529 Noninfective gastroenteritis and colitis, unspecified: Secondary | ICD-10-CM | POA: Diagnosis not present

## 2023-08-31 DIAGNOSIS — B9789 Other viral agents as the cause of diseases classified elsewhere: Secondary | ICD-10-CM

## 2023-08-31 DIAGNOSIS — J988 Other specified respiratory disorders: Secondary | ICD-10-CM

## 2023-08-31 DIAGNOSIS — I10 Essential (primary) hypertension: Secondary | ICD-10-CM

## 2023-08-31 LAB — POCT INFLUENZA A/B
Influenza A, POC: NEGATIVE
Influenza B, POC: NEGATIVE

## 2023-08-31 MED ORDER — ONDANSETRON HCL 4 MG PO TABS
4.0000 mg | ORAL_TABLET | Freq: Two times a day (BID) | ORAL | 0 refills | Status: AC | PRN
  Filled 2023-08-31: qty 10, 5d supply, fill #0

## 2023-08-31 MED ORDER — LOPERAMIDE HCL 2 MG PO CAPS
ORAL_CAPSULE | ORAL | 0 refills | Status: AC
Start: 2023-08-31 — End: ?
  Filled 2023-08-31: qty 15, 5d supply, fill #0

## 2023-08-31 MED ORDER — CARVEDILOL 12.5 MG PO TABS
12.5000 mg | ORAL_TABLET | Freq: Two times a day (BID) | ORAL | 1 refills | Status: AC
  Filled 2023-08-31: qty 180, 90d supply, fill #0
  Filled 2024-03-27: qty 180, 90d supply, fill #1

## 2023-08-31 MED ORDER — BENZONATATE 100 MG PO CAPS
100.0000 mg | ORAL_CAPSULE | Freq: Three times a day (TID) | ORAL | 0 refills | Status: AC | PRN
Start: 2023-08-31 — End: ?
  Filled 2023-08-31: qty 20, 7d supply, fill #0

## 2023-08-31 MED ORDER — IRBESARTAN 150 MG PO TABS
150.0000 mg | ORAL_TABLET | Freq: Every day | ORAL | 1 refills | Status: AC
Start: 2023-08-31 — End: ?
  Filled 2023-08-31: qty 90, 90d supply, fill #0
  Filled 2024-03-27: qty 90, 90d supply, fill #1

## 2023-08-31 NOTE — Progress Notes (Signed)
Patient ID: Monique Aguilar, female    DOB: 07-28-1966  MRN: [id number]  CC: Hypertension (HTN f/u. Barron [facility]vine, body aches, cough, high BP readings, nausea, headaches, SOB, sinus pressure x4 days /Yes to pap for another appt)   Subjective: Monique Aguilar is a 58 y.o. female who presents for UC vsit Her concerns today include:  Pt with hx of HTN,  dCHF, anomalous RCA, anemia, migraines, obesity, HL, fhx of CAD, obesity   Discussed the use of AI scribe software for clinical note transcription with the patient, who gave verbal consent to proceed.  History of Present Illness   The patient, with a history of hypertension, presents with a 4-day history of headache, nausea, vomiting, and diarrhea.  Diarrhea has been occurring every hour to 45 minutes.  No blood in the stools; no recent antibiotic use.  Symptoms have been associated with cough productive of thick phlegm, mild congestion and feeling of pressure in the facial sinuses.  She denies any sore throat or discolored nasal mucus.  Due to the nausea, she has not been eating solid foods.  Existing on water, ginger ale, Jell-O and crackers for the past several days.  She also reports some shortness of breath when she lays down.  She had fever of 100 about 2 days ago.  S She notes that her blood pressure has also been elevated with the highest being 160/102.  She checks blood pressure daily and gives range of 140s/88-90.  She has been taking her blood pressure medicine a VaPro 150 mg daily, carvedilol 12.5 mg twice a day and furosemide.   Patient Active Problem List   Diagnosis Date Noted   Obesity (BMI 30.0-34.9) 07/09/2020   Leg cramps 07/09/2020   Stress incontinence 10/08/2018   Food allergy 10/08/2018   Left breast mass 08/15/2016   Migraine without aura and without status migrainosus, not intractable 08/15/2016   Anomalous right coronary artery    Chronic diastolic congestive heart failure (HCC)    Hyperlipidemia 05/31/2014   Family  history of premature coronary artery disease 05/31/2014   Morbid obesity (HCC) 05/31/2014   HTN (hypertension) 10/11/2013   Microcytic anemia 12/16/2012   Renal insufficiency 12/16/2012     Current Outpatient Medications on File Prior to Visit  Medication Sig Dispense Refill   acetaminophen (TYLENOL) 500 MG tablet Take 1,000 mg by mouth daily as needed for headache.     EPINEPHrine 0.3 mg/0.3 mL IJ SOAJ injection Inject 0.3 mg into the muscle as needed for anaphylaxis. 2 each 1   furosemide (LASIX) 20 MG tablet Take 1 tablet (20 mg total) by mouth daily. 90 tablet 1   ibuprofen (ADVIL) 200 MG tablet Take 400 mg by mouth daily as needed for headache.     potassium chloride (KLOR-CON) 10 MEQ tablet Take 1 tablet (10 mEq total) by mouth daily. 90 tablet 1   No current facility-administered medications on file prior to visit.    Allergies  Allergen Reactions   Almond (Diagnostic) Anaphylaxis   Apple Anaphylaxis   Fruit & Vegetable Daily [Nutritional Supplements] Anaphylaxis    Strawberries or other fruit   Grass Extracts [Gramineae Pollens] Other (See Comments)    Watery eyes, sneezing   Peach [Prunus Persica] Nausea And Vomiting    Social History   Socioeconomic History   Marital status: Divorced    Spouse name: Not on file   Number of children: Not on file   Years of education: Not on file   Highest education  level: Associate degree: academic program  Occupational History   Not on file  Tobacco Use   Smoking status: Never   Smokeless tobacco: Never  Vaping Use   Vaping status: Never Used  Substance and Sexual Activity   Alcohol use: No   Drug use: No   Sexual activity: Not Currently    Birth control/protection: Surgical  Other Topics Concern   Not on file  Social History Narrative   Not on file   Social Drivers of Health   Financial Resource Strain: Low Risk  (08/31/2023)   Overall Financial Resource Strain (CARDIA)    Difficulty of Paying Living Expenses: Not  very hard  Food Insecurity: No Food Insecurity (08/31/2023)   Hunger Vital Sign    Worried About Running Out of Food in the Last Year: Never true    Ran Out of Food in the Last Year: Never true  Transportation Needs: No Transportation Needs (08/31/2023)   PRAPARE - Administrator, Civil Service (Medical): No    Lack of Transportation (Non-Medical): No  Physical Activity: Unknown (08/31/2023)   Exercise Vital Sign    Days of Exercise per Week: Patient declined    Minutes of Exercise per Session: 20 min  Stress: No Stress Concern Present (08/31/2023)   [employer] of Occupational Health - Occupational Stress Questionnaire    Feeling of Stress : Not at all  Social Connections: Unknown (08/31/2023)   Social Connection and Isolation Panel [NHANES]    Frequency of Communication with Friends and Family: Three times a week    Frequency of Social Gatherings with Friends and Family: Patient declined    Attends Religious Services: Patient declined    Database administrator or Organizations: Patient declined    Attends Banker Meetings: 1 to 4 times per year    Marital Status: Divorced  Intimate Partner Violence: Not At Risk (05/22/2023)   Humiliation, Afraid, Rape, and Kick questionnaire    Fear of Current or Ex-Partner: No    Emotionally Abused: No    Physically Abused: No    Sexually Abused: No    Family History  Problem Relation Age of Onset   Heart disease Father        Dx early 72's (? heart valve)   Heart attack Father 45   Congestive Heart Failure Mother    CAD Neg Hx    Colon cancer Neg Hx    Colon polyps Neg Hx    Esophageal cancer Neg Hx    Stomach cancer Neg Hx    Rectal cancer Neg Hx     Past Surgical History:  Procedure Laterality Date   BREAST CYST ASPIRATION     CESAREAN SECTION  1988   TONSILLECTOMY  1992?   TUBAL LIGATION  1995   WISDOM TOOTH EXTRACTION  1995    ROS: Review of Systems Negative except as stated above  PHYSICAL  EXAM: BP 119/81   Pulse 70   Temp 98.5 F (36.9 C) (Oral)   Ht 5\' 6"  (1.676 m)   Wt 258 lb (117 kg)   LMP 01/26/2018   SpO2 97%   BMI 41.64 kg/m   Physical Exam  General appearance - alert, well appearing, middle-age can American female and in no distress Mental status - normal mood, behavior, speech, dress, motor activity, and thought processes Mouth - mucous membranes moist, pharynx normal without lesions Chest - clear to auscultation, no wheezes, rales or rhonchi, symmetric air entry Heart - normal  rate, regular rhythm, normal S1, S2, no murmurs, rubs, clicks or gallops Extremities - peripheral pulses normal, no pedal edema, no clubbing or cyanosis      Latest Ref Rng & Units 05/22/2023    3:02 PM 10/01/2022    8:53 AM 04/17/2022   11:04 AM  CMP  Glucose 70 - 99 mg/dL 81  83  89   BUN 6 - 24 mg/dL 17  10  12    Creatinine 0.57 - 1.00 mg/dL 1.61  0.96  0.45   Sodium 134 - 144 mmol/L 140  140  140   Potassium 3.5 - 5.2 mmol/L 4.3  4.2  4.3   Chloride 96 - 106 mmol/L 103  106  107   CO2 20 - 29 mmol/L 23  24  25    Calcium 8.7 - 10.2 mg/dL 9.6  9.3  9.5   Total Protein 6.0 - 8.5 g/dL 7.5  7.7    Total Bilirubin 0.0 - 1.2 mg/dL 0.3  0.7    Alkaline Phos 44 - 121 IU/L 79  65    AST 0 - 40 IU/L 17  24    ALT 0 - 32 IU/L 16  22     Lipid Panel     Component Value Date/Time   CHOL 195 05/22/2023 1502   TRIG 74 05/22/2023 1502   HDL 57 05/22/2023 1502   CHOLHDL 3.4 05/22/2023 1502   CHOLHDL 3.1 10/11/2013 0125   VLDL 32 10/11/2013 0125   LDLCALC 124 (H) 05/22/2023 1502    CBC    Component Value Date/Time   WBC 3.9 05/22/2023 1502   WBC 4.3 10/01/2022 0853   RBC 4.50 05/22/2023 1502   RBC 4.59 10/01/2022 0853   HGB 11.9 05/22/2023 1502   HCT 36.4 05/22/2023 1502   PLT 326 05/22/2023 1502   MCV 81 05/22/2023 1502   MCH 26.4 (L) 05/22/2023 1502   MCH 26.6 10/01/2022 0853   MCHC 32.7 05/22/2023 1502   MCHC 31.3 10/01/2022 0853   RDW 13.7 05/22/2023 1502    LYMPHSABS 2.4 10/01/2022 0853   LYMPHSABS 3.1 09/08/2019 1912   MONOABS 0.3 10/01/2022 0853   EOSABS 0.2 10/01/2022 0853   EOSABS 0.1 09/08/2019 1912   BASOSABS 0.0 10/01/2022 0853   BASOSABS 0.0 09/08/2019 1912   Results for orders placed or performed in visit on 08/31/23  POCT Influenza A/B   Collection Time: 08/31/23  2:28 PM  Result Value Ref Range   Influenza A, POC Negative Negative   Influenza B, POC Negative Negative    ASSESSMENT AND PLAN: 1. Viral respiratory illness (Primary) Syndrome overall resembles acute viral illness. Recommend that she push fluids. Use Tylenol as needed for any body aches or fever. We will keep her out of work for the next several days. - POCT Influenza A/B - benzonatate (TESSALON PERLES) 100 MG capsule; Take 1 capsule (100 mg total) by mouth 3 (three) times daily as needed for cough.  Dispense: 20 capsule; Refill: 0  2. Acute gastroenteritis See #1 above.  She may want to hold furosemide for a few days until she is back eating normally and diarrhea has subsided. - loperamide (IMODIUM) 2 MG capsule; Take 2 tabs initially then 1 tab PO Q 8 hrs prn diarrhea  Dispense: 15 capsule; Refill: 0 - ondansetron (ZOFRAN) 4 MG tablet; Take 1 tablet (4 mg total) by mouth 2 (two) times daily as needed for nausea or vomiting.  Dispense: 10 tablet; Refill: 0 - Clostridium Difficile by  PCR([facility] only )  3. Essential hypertension Blood pressure today is good.  Advised that she brings her home blood pressure wrist device with her on her next visit so that we can check blood pressure with our machine and with hers. - carvedilol (COREG) 12.5 MG tablet; Take 1 tablet (12.5 mg total) by mouth 2 (two) times daily with a meal.  Dispense: 180 tablet; Refill: 1 - irbesartan (AVAPRO) 150 MG tablet; Take 1 tablet (150 mg total) by mouth daily.  Dispense: 90 tablet; Refill: 1     Patient was given the opportunity to ask questions.  Patient verbalized understanding of the  plan and was able to repeat key elements of the plan.   This documentation was completed using Paediatric nurse.  Any transcriptional errors are unintentional.  Orders Placed This Encounter  Procedures   Clostridium Difficile by PCR([facility] only )   POCT Influenza A/B     Requested Prescriptions   Signed Prescriptions Disp Refills   carvedilol (COREG) 12.5 MG tablet 180 tablet 1    Sig: Take 1 tablet (12.5 mg total) by mouth 2 (two) times daily with a meal.   irbesartan (AVAPRO) 150 MG tablet 90 tablet 1    Sig: Take 1 tablet (150 mg total) by mouth daily.   loperamide (IMODIUM) 2 MG capsule 15 capsule 0    Sig: Take 2 capsules (4 mg total) by mouth initially, then 1 capsule (2 mg total) every 8 (eight) hours as needed.   ondansetron (ZOFRAN) 4 MG tablet 10 tablet 0    Sig: Take 1 tablet (4 mg total) by mouth 2 (two) times daily as needed for nausea or vomiting.   benzonatate (TESSALON PERLES) 100 MG capsule 20 capsule 0    Sig: Take 1 capsule (100 mg total) by mouth 3 (three) times daily as needed for cough.    No follow-ups on file.  Jonah Blue, MD, FACP

## 2023-09-25 ENCOUNTER — Ambulatory Visit: Payer: BC Managed Care – PPO | Admitting: Internal Medicine

## 2024-03-28 ENCOUNTER — Other Ambulatory Visit: Payer: Self-pay

## 2024-03-31 ENCOUNTER — Other Ambulatory Visit: Payer: Self-pay

## 2024-05-18 ENCOUNTER — Encounter: Payer: Self-pay | Admitting: Internal Medicine
